# Patient Record
Sex: Female | Born: 1935 | Race: White | Hispanic: No | Smoking: Former smoker
Health system: Southern US, Community
[De-identification: ages and names within clinical notes are randomized; demographics above are authoritative.]

## PROBLEM LIST (undated history)

## (undated) DIAGNOSIS — C4490 Unspecified malignant neoplasm of skin, unspecified: Secondary | ICD-10-CM

## (undated) DIAGNOSIS — I350 Nonrheumatic aortic (valve) stenosis: Secondary | ICD-10-CM

## (undated) DIAGNOSIS — E785 Hyperlipidemia, unspecified: Secondary | ICD-10-CM

## (undated) DIAGNOSIS — I639 Cerebral infarction, unspecified: Secondary | ICD-10-CM

## (undated) DIAGNOSIS — I10 Essential (primary) hypertension: Secondary | ICD-10-CM

## (undated) DIAGNOSIS — Z9889 Other specified postprocedural states: Secondary | ICD-10-CM

## (undated) DIAGNOSIS — R011 Cardiac murmur, unspecified: Secondary | ICD-10-CM

## (undated) DIAGNOSIS — C801 Malignant (primary) neoplasm, unspecified: Secondary | ICD-10-CM

## (undated) DIAGNOSIS — M1990 Unspecified osteoarthritis, unspecified site: Secondary | ICD-10-CM

## (undated) DIAGNOSIS — E7800 Pure hypercholesterolemia, unspecified: Secondary | ICD-10-CM

## (undated) DIAGNOSIS — R112 Nausea with vomiting, unspecified: Secondary | ICD-10-CM

## (undated) DIAGNOSIS — G459 Transient cerebral ischemic attack, unspecified: Secondary | ICD-10-CM

## (undated) DIAGNOSIS — Z8719 Personal history of other diseases of the digestive system: Secondary | ICD-10-CM

## (undated) DIAGNOSIS — M1610 Unilateral primary osteoarthritis, unspecified hip: Secondary | ICD-10-CM

## (undated) DIAGNOSIS — Z862 Personal history of diseases of the blood and blood-forming organs and certain disorders involving the immune mechanism: Secondary | ICD-10-CM

## (undated) HISTORY — DX: Unspecified malignant neoplasm of skin, unspecified: C44.90

## (undated) HISTORY — PX: ABDOMINAL HYSTERECTOMY: SHX81

## (undated) HISTORY — DX: Unilateral primary osteoarthritis, unspecified hip: M16.10

## (undated) HISTORY — PX: CHOLECYSTECTOMY: SHX55

## (undated) HISTORY — DX: Transient cerebral ischemic attack, unspecified: G45.9

## (undated) HISTORY — DX: Nonrheumatic aortic (valve) stenosis: I35.0

## (undated) HISTORY — DX: Essential (primary) hypertension: I10

## (undated) HISTORY — DX: Hyperlipidemia, unspecified: E78.5

## (undated) HISTORY — PX: COLONOSCOPY: SHX174

---
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
## 2011-11-27 DIAGNOSIS — M949 Disorder of cartilage, unspecified: Secondary | ICD-10-CM | POA: Diagnosis not present

## 2011-11-27 DIAGNOSIS — M899 Disorder of bone, unspecified: Secondary | ICD-10-CM | POA: Diagnosis not present

## 2011-12-09 DIAGNOSIS — H40019 Open angle with borderline findings, low risk, unspecified eye: Secondary | ICD-10-CM | POA: Diagnosis not present

## 2011-12-15 DIAGNOSIS — M25519 Pain in unspecified shoulder: Secondary | ICD-10-CM | POA: Diagnosis not present

## 2011-12-17 DIAGNOSIS — Z6828 Body mass index (BMI) 28.0-28.9, adult: Secondary | ICD-10-CM | POA: Diagnosis not present

## 2011-12-23 DIAGNOSIS — M25519 Pain in unspecified shoulder: Secondary | ICD-10-CM | POA: Diagnosis not present

## 2011-12-26 DIAGNOSIS — M25519 Pain in unspecified shoulder: Secondary | ICD-10-CM | POA: Diagnosis not present

## 2011-12-30 DIAGNOSIS — M25519 Pain in unspecified shoulder: Secondary | ICD-10-CM | POA: Diagnosis not present

## 2012-01-01 DIAGNOSIS — J042 Acute laryngotracheitis: Secondary | ICD-10-CM | POA: Diagnosis not present

## 2012-01-01 DIAGNOSIS — M25519 Pain in unspecified shoulder: Secondary | ICD-10-CM | POA: Diagnosis not present

## 2012-01-05 DIAGNOSIS — J0100 Acute maxillary sinusitis, unspecified: Secondary | ICD-10-CM | POA: Diagnosis not present

## 2012-01-14 DIAGNOSIS — M25519 Pain in unspecified shoulder: Secondary | ICD-10-CM | POA: Diagnosis not present

## 2012-02-02 DIAGNOSIS — M25519 Pain in unspecified shoulder: Secondary | ICD-10-CM | POA: Diagnosis not present

## 2012-02-10 DIAGNOSIS — M25519 Pain in unspecified shoulder: Secondary | ICD-10-CM | POA: Diagnosis not present

## 2012-03-04 DIAGNOSIS — I059 Rheumatic mitral valve disease, unspecified: Secondary | ICD-10-CM | POA: Diagnosis not present

## 2012-03-04 DIAGNOSIS — Z79899 Other long term (current) drug therapy: Secondary | ICD-10-CM | POA: Diagnosis not present

## 2012-03-04 DIAGNOSIS — D508 Other iron deficiency anemias: Secondary | ICD-10-CM | POA: Diagnosis not present

## 2012-03-04 DIAGNOSIS — E782 Mixed hyperlipidemia: Secondary | ICD-10-CM | POA: Diagnosis not present

## 2012-03-04 DIAGNOSIS — I359 Nonrheumatic aortic valve disorder, unspecified: Secondary | ICD-10-CM | POA: Diagnosis not present

## 2012-03-08 DIAGNOSIS — I359 Nonrheumatic aortic valve disorder, unspecified: Secondary | ICD-10-CM | POA: Diagnosis not present

## 2012-03-08 DIAGNOSIS — I369 Nonrheumatic tricuspid valve disorder, unspecified: Secondary | ICD-10-CM | POA: Diagnosis not present

## 2012-03-08 DIAGNOSIS — I079 Rheumatic tricuspid valve disease, unspecified: Secondary | ICD-10-CM | POA: Diagnosis not present

## 2012-03-12 DIAGNOSIS — M1710 Unilateral primary osteoarthritis, unspecified knee: Secondary | ICD-10-CM | POA: Diagnosis not present

## 2012-03-12 DIAGNOSIS — M25569 Pain in unspecified knee: Secondary | ICD-10-CM | POA: Diagnosis not present

## 2012-03-25 DIAGNOSIS — K5730 Diverticulosis of large intestine without perforation or abscess without bleeding: Secondary | ICD-10-CM | POA: Diagnosis not present

## 2012-03-25 DIAGNOSIS — K5520 Angiodysplasia of colon without hemorrhage: Secondary | ICD-10-CM | POA: Diagnosis not present

## 2012-03-25 DIAGNOSIS — Z1211 Encounter for screening for malignant neoplasm of colon: Secondary | ICD-10-CM | POA: Diagnosis not present

## 2012-04-21 DIAGNOSIS — J301 Allergic rhinitis due to pollen: Secondary | ICD-10-CM | POA: Diagnosis not present

## 2012-04-21 DIAGNOSIS — M545 Low back pain: Secondary | ICD-10-CM | POA: Diagnosis not present

## 2012-04-21 DIAGNOSIS — M546 Pain in thoracic spine: Secondary | ICD-10-CM | POA: Diagnosis not present

## 2012-04-23 DIAGNOSIS — M545 Low back pain: Secondary | ICD-10-CM | POA: Diagnosis not present

## 2012-04-23 DIAGNOSIS — M25569 Pain in unspecified knee: Secondary | ICD-10-CM | POA: Diagnosis not present

## 2012-04-23 DIAGNOSIS — M1710 Unilateral primary osteoarthritis, unspecified knee: Secondary | ICD-10-CM | POA: Diagnosis not present

## 2012-04-23 DIAGNOSIS — M546 Pain in thoracic spine: Secondary | ICD-10-CM | POA: Diagnosis not present

## 2012-04-26 DIAGNOSIS — M545 Low back pain: Secondary | ICD-10-CM | POA: Diagnosis not present

## 2012-04-26 DIAGNOSIS — M546 Pain in thoracic spine: Secondary | ICD-10-CM | POA: Diagnosis not present

## 2012-05-04 DIAGNOSIS — M546 Pain in thoracic spine: Secondary | ICD-10-CM | POA: Diagnosis not present

## 2012-05-04 DIAGNOSIS — M545 Low back pain: Secondary | ICD-10-CM | POA: Diagnosis not present

## 2012-05-06 DIAGNOSIS — M546 Pain in thoracic spine: Secondary | ICD-10-CM | POA: Diagnosis not present

## 2012-05-06 DIAGNOSIS — M545 Low back pain: Secondary | ICD-10-CM | POA: Diagnosis not present

## 2012-05-11 DIAGNOSIS — M546 Pain in thoracic spine: Secondary | ICD-10-CM | POA: Diagnosis not present

## 2012-05-11 DIAGNOSIS — M545 Low back pain: Secondary | ICD-10-CM | POA: Diagnosis not present

## 2012-06-07 DIAGNOSIS — R0982 Postnasal drip: Secondary | ICD-10-CM | POA: Diagnosis not present

## 2012-06-07 DIAGNOSIS — J301 Allergic rhinitis due to pollen: Secondary | ICD-10-CM | POA: Diagnosis not present

## 2012-06-07 DIAGNOSIS — R05 Cough: Secondary | ICD-10-CM | POA: Diagnosis not present

## 2012-06-28 DIAGNOSIS — M545 Low back pain: Secondary | ICD-10-CM | POA: Diagnosis not present

## 2012-06-28 DIAGNOSIS — M546 Pain in thoracic spine: Secondary | ICD-10-CM | POA: Diagnosis not present

## 2012-07-02 DIAGNOSIS — Z1231 Encounter for screening mammogram for malignant neoplasm of breast: Secondary | ICD-10-CM | POA: Diagnosis not present

## 2012-08-12 DIAGNOSIS — Z23 Encounter for immunization: Secondary | ICD-10-CM | POA: Diagnosis not present

## 2012-08-16 DIAGNOSIS — H40019 Open angle with borderline findings, low risk, unspecified eye: Secondary | ICD-10-CM | POA: Diagnosis not present

## 2012-09-30 DIAGNOSIS — L719 Rosacea, unspecified: Secondary | ICD-10-CM | POA: Diagnosis not present

## 2012-09-30 DIAGNOSIS — L820 Inflamed seborrheic keratosis: Secondary | ICD-10-CM | POA: Diagnosis not present

## 2012-11-22 DIAGNOSIS — M12559 Traumatic arthropathy, unspecified hip: Secondary | ICD-10-CM | POA: Diagnosis not present

## 2012-11-22 DIAGNOSIS — M25569 Pain in unspecified knee: Secondary | ICD-10-CM | POA: Diagnosis not present

## 2012-11-22 DIAGNOSIS — M21169 Varus deformity, not elsewhere classified, unspecified knee: Secondary | ICD-10-CM | POA: Diagnosis not present

## 2012-11-22 DIAGNOSIS — M1710 Unilateral primary osteoarthritis, unspecified knee: Secondary | ICD-10-CM | POA: Diagnosis not present

## 2012-11-22 DIAGNOSIS — R269 Unspecified abnormalities of gait and mobility: Secondary | ICD-10-CM | POA: Diagnosis not present

## 2012-12-08 DIAGNOSIS — M21169 Varus deformity, not elsewhere classified, unspecified knee: Secondary | ICD-10-CM | POA: Diagnosis not present

## 2012-12-08 DIAGNOSIS — R269 Unspecified abnormalities of gait and mobility: Secondary | ICD-10-CM | POA: Diagnosis not present

## 2012-12-08 DIAGNOSIS — M1710 Unilateral primary osteoarthritis, unspecified knee: Secondary | ICD-10-CM | POA: Diagnosis not present

## 2012-12-08 DIAGNOSIS — M25569 Pain in unspecified knee: Secondary | ICD-10-CM | POA: Diagnosis not present

## 2012-12-20 DIAGNOSIS — H40019 Open angle with borderline findings, low risk, unspecified eye: Secondary | ICD-10-CM | POA: Diagnosis not present

## 2013-01-20 DIAGNOSIS — L821 Other seborrheic keratosis: Secondary | ICD-10-CM | POA: Diagnosis not present

## 2013-01-24 DIAGNOSIS — Z23 Encounter for immunization: Secondary | ICD-10-CM | POA: Diagnosis not present

## 2013-01-24 DIAGNOSIS — S41109A Unspecified open wound of unspecified upper arm, initial encounter: Secondary | ICD-10-CM | POA: Diagnosis not present

## 2013-01-24 DIAGNOSIS — B009 Herpesviral infection, unspecified: Secondary | ICD-10-CM | POA: Diagnosis not present

## 2013-03-07 DIAGNOSIS — Z0000 Encounter for general adult medical examination without abnormal findings: Secondary | ICD-10-CM | POA: Diagnosis not present

## 2013-03-07 DIAGNOSIS — I359 Nonrheumatic aortic valve disorder, unspecified: Secondary | ICD-10-CM | POA: Diagnosis not present

## 2013-03-07 DIAGNOSIS — Z79899 Other long term (current) drug therapy: Secondary | ICD-10-CM | POA: Diagnosis not present

## 2013-03-07 DIAGNOSIS — E782 Mixed hyperlipidemia: Secondary | ICD-10-CM | POA: Diagnosis not present

## 2013-03-07 DIAGNOSIS — I10 Essential (primary) hypertension: Secondary | ICD-10-CM | POA: Diagnosis not present

## 2013-03-08 DIAGNOSIS — N390 Urinary tract infection, site not specified: Secondary | ICD-10-CM | POA: Diagnosis not present

## 2013-03-21 DIAGNOSIS — R269 Unspecified abnormalities of gait and mobility: Secondary | ICD-10-CM | POA: Diagnosis not present

## 2013-03-21 DIAGNOSIS — M1710 Unilateral primary osteoarthritis, unspecified knee: Secondary | ICD-10-CM | POA: Diagnosis not present

## 2013-03-21 DIAGNOSIS — M21169 Varus deformity, not elsewhere classified, unspecified knee: Secondary | ICD-10-CM | POA: Diagnosis not present

## 2013-03-21 DIAGNOSIS — M7120 Synovial cyst of popliteal space [Baker], unspecified knee: Secondary | ICD-10-CM | POA: Diagnosis not present

## 2013-05-25 DIAGNOSIS — H40019 Open angle with borderline findings, low risk, unspecified eye: Secondary | ICD-10-CM | POA: Diagnosis not present

## 2013-06-17 DIAGNOSIS — L820 Inflamed seborrheic keratosis: Secondary | ICD-10-CM | POA: Diagnosis not present

## 2013-06-23 DIAGNOSIS — M1710 Unilateral primary osteoarthritis, unspecified knee: Secondary | ICD-10-CM | POA: Diagnosis not present

## 2013-06-23 DIAGNOSIS — M25569 Pain in unspecified knee: Secondary | ICD-10-CM | POA: Diagnosis not present

## 2013-07-06 DIAGNOSIS — M25569 Pain in unspecified knee: Secondary | ICD-10-CM | POA: Diagnosis not present

## 2013-07-06 DIAGNOSIS — M1710 Unilateral primary osteoarthritis, unspecified knee: Secondary | ICD-10-CM | POA: Diagnosis not present

## 2013-07-08 DIAGNOSIS — Z1231 Encounter for screening mammogram for malignant neoplasm of breast: Secondary | ICD-10-CM | POA: Diagnosis not present

## 2013-08-12 DIAGNOSIS — Z23 Encounter for immunization: Secondary | ICD-10-CM | POA: Diagnosis not present

## 2013-09-09 DIAGNOSIS — J069 Acute upper respiratory infection, unspecified: Secondary | ICD-10-CM | POA: Diagnosis not present

## 2013-10-28 DIAGNOSIS — M1710 Unilateral primary osteoarthritis, unspecified knee: Secondary | ICD-10-CM | POA: Diagnosis not present

## 2013-11-04 DIAGNOSIS — R9431 Abnormal electrocardiogram [ECG] [EKG]: Secondary | ICD-10-CM | POA: Diagnosis not present

## 2013-11-04 DIAGNOSIS — I359 Nonrheumatic aortic valve disorder, unspecified: Secondary | ICD-10-CM | POA: Diagnosis not present

## 2013-11-04 DIAGNOSIS — Z01818 Encounter for other preprocedural examination: Secondary | ICD-10-CM | POA: Diagnosis not present

## 2013-11-07 DIAGNOSIS — E7800 Pure hypercholesterolemia, unspecified: Secondary | ICD-10-CM | POA: Diagnosis not present

## 2013-11-07 DIAGNOSIS — I10 Essential (primary) hypertension: Secondary | ICD-10-CM | POA: Diagnosis not present

## 2013-11-07 DIAGNOSIS — Z006 Encounter for examination for normal comparison and control in clinical research program: Secondary | ICD-10-CM | POA: Diagnosis not present

## 2013-11-07 DIAGNOSIS — I359 Nonrheumatic aortic valve disorder, unspecified: Secondary | ICD-10-CM | POA: Diagnosis not present

## 2013-11-09 DIAGNOSIS — R0789 Other chest pain: Secondary | ICD-10-CM | POA: Diagnosis not present

## 2013-11-09 DIAGNOSIS — I515 Myocardial degeneration: Secondary | ICD-10-CM | POA: Diagnosis not present

## 2013-11-09 DIAGNOSIS — R9431 Abnormal electrocardiogram [ECG] [EKG]: Secondary | ICD-10-CM | POA: Diagnosis not present

## 2013-11-09 DIAGNOSIS — I369 Nonrheumatic tricuspid valve disorder, unspecified: Secondary | ICD-10-CM | POA: Diagnosis not present

## 2013-12-01 DIAGNOSIS — M1710 Unilateral primary osteoarthritis, unspecified knee: Secondary | ICD-10-CM | POA: Diagnosis not present

## 2013-12-07 DIAGNOSIS — I10 Essential (primary) hypertension: Secondary | ICD-10-CM | POA: Diagnosis not present

## 2014-01-12 ENCOUNTER — Other Ambulatory Visit: Payer: Self-pay | Admitting: Orthopedic Surgery

## 2014-01-12 NOTE — H&P (Signed)
Sacheen Goughnour  DOB: [birth year] Married / Language: English / Race: White Female  Date of Admission:  01-23-2014  Chief Complaint:  Right Knee Pain  History of Present Illness The patient is a 80 year old female who comes in for a preoperative History and Physical. The patient is scheduled for a right total knee arthroplasty to be performed by Dr. Dione Plover. Aluisio, MD at [facility] on 01-23-2014. The patient is seen for a second opinion. The patient reports right knee (worse than left) symptoms including: pain, swelling and grinding . The patient has the current diagnosis of knee osteoarthritis. Prior to being seen today the patient was previously evaluated by a colleague. Previous work-up for this problem has included knee x-rays and arthroscopy of the right knee in 2001. Past treatment for this problem has included economy hinge brace but she states that made her knee feel worse. Current treatment includes nonsteroidal anti-inflammatory drugs (ibuprofen prn). She has had cortisone injections previously, most recent in August which do help. She does not get 100% relief but is able to get around with much less discomfort. She denies locking and catching in the knee. She has a lot of trouble with stairs as well as getting up and down from a seated position. She has a history of intra-articular injection in the right hip for OA. She does occasionally have pain in the right groin, mostly with rotational movements. The right knee does bother her more than the left knee even though she has been told that the left knee "looks worse on x-ray". She says that she is still able to do the things she needs to do but always had discomfort in the knees. She participates in a water aerobics program which helps her knees but has not been since this summer. She has previously been treated at [facility]. She has had injections in the past by Dr. Dierdre Harness who has since moved to [place].  [name] states that her right knee hurts a lot more than the left. It bothers her with most activities and occasionally hurts at rest. She said she has pain about 90% of the time. She does not have functional limitations except with stairs or uneven ground. She would like to be able to do more but the knee does prevent her from doing so. She has had cortisone injections which have lasted variable amounts of time. The initial cortisone used to last about six months then a recent injection has lasted only three months. She does not have pain at night at this time. She also has had pain in the right hip. She has had intraarticular hip injection. She has not had one in two and a half years. Her knees hurt far more than her hips. She is now ready to get the right knee fixed first. They have been treated conservatively in the past for the above stated problem and despite conservative measures, they continue to have progressive pain and severe functional limitations and dysfunction. They have failed non-operative management including home exercise, medications, and injections. It is felt that they would benefit from undergoing total joint replacement. Risks and benefits of the procedure have been discussed with the patient and they elect to proceed with surgery. There are no active contraindications to surgery such as ongoing infection or rapidly progressive neurological disease.  Allergies Biaxin *MACROLIDES*  Problem List/Past Medical Primary osteoarthritis of both knees (715.16) Heart murmur High blood pressure Hypercholesterolemia Osteoarthritis Measles Mumps  Family History Heart Disease.  First Degree Relatives. father Cerebrovascular Accident. mother   Social History Current work status. retired Careers information officer. 4 Alcohol use. current drinker; drinks wine; only occasionally per week Living situation. live with spouse Previously in rehab. no Pain Contract. no Tobacco use.  Former smoker. former smoker; smoke(d) less than 1/2 pack(s) per day Illicit drug use. no Exercise. Exercises weekly; does other Drug/Alcohol Rehab (Currently). no Marital status. married Number of flights of stairs before winded. 2-3 Advance Directives. Living Will, Healthcare POA    Medication History Losartan Potassium-HCTZ (100-12.5MG  Tablet, Oral) Active. Simvastatin (10MG  Tablet, Oral) Active. Latanoprost (0.005% Solution, Ophthalmic) Active. Vitamin D ( Oral) Specific dose unknown - Active. Biotin ( Oral) Specific dose unknown - Active. CoQ10 ( Oral) Specific dose unknown - Active. Aspirin EC (81MG  Tablet DR, Oral) Active. Advil (200MG  Tablet, Oral) Active. Fish Oil Burp-Less (1000MG  Capsule, Oral) Active.   Past Surgical History Tonsillectomy. Date: 10. Arthroscopy of Knee. right Gallbladder Surgery. Date: 59. open Hysterectomy. Date: 2005. complete (non-cancerous)   Review of Systems General:Not Present- Chills, Fever, Night Sweats, Fatigue, Weight Gain, Weight Loss and Memory Loss. Skin:Not Present- Hives, Itching, Rash, Eczema and Lesions. HEENT:Not Present- Tinnitus, Headache, Double Vision, Visual Loss, Hearing Loss and Dentures. Respiratory:Not Present- Shortness of breath with exertion, Shortness of breath at rest, Allergies, Coughing up blood and Chronic Cough. Cardiovascular:Not Present- Chest Pain, Racing/skipping heartbeats, Difficulty Breathing Lying Down, Murmur, Swelling and Palpitations. Gastrointestinal:Not Present- Bloody Stool, Heartburn, Abdominal Pain, Vomiting, Nausea, Constipation, Diarrhea, Difficulty Swallowing, Jaundice and Loss of appetitie. Female Genitourinary:Not Present- Blood in Urine, Urinary frequency, Weak urinary stream, Discharge, Flank Pain, Incontinence, Painful Urination, Urgency, Urinary Retention and Urinating at Night. Musculoskeletal:Present- Joint Pain and Morning Stiffness. Not Present- Muscle Weakness,  Muscle Pain, Joint Swelling, Back Pain and Spasms. Neurological:Not Present- Tremor, Dizziness, Blackout spells, Paralysis, Difficulty with balance and Weakness. Psychiatric:Not Present- Insomnia.    Vitals Pulse: 52 (Regular) Resp.: 12 (Unlabored) BP: 152/74 (Sitting, Left Arm, Standard)     Physical Exam The physical exam findings are as follows:   General Mental Status - Alert, cooperative and good historian. General Appearance- pleasant. Not in acute distress. Orientation- Oriented X3. Build & Nutrition- Well nourished and Well developed.   Head and Neck Head- normocephalic, atraumatic . Neck Global Assessment- bruit auscultated on the right (faint, likely referred murmur from chest), bruit auscultated on the left (faint, likely referred murmur from chest) and supple.   Eye Vision- Wears corrective lenses. Pupil- Bilateral- Regular and Round. Motion- Bilateral- EOMI.   Chest and Lung Exam Auscultation: Breath sounds:- clear at anterior chest wall and - clear at posterior chest wall. Adventitious sounds:- No Adventitious sounds.   Cardiovascular Auscultation:Rhythm- Regular rate and rhythm. Heart Sounds- S1 WNL and S2 WNL. Murmurs & Other Heart Sounds: Murmur 1:Location- Aortic Area, Pulmonic Area and Sternal Border - Left. Timing- Early systolic. Grade- III/VI. Character- Medium pitched. Radiation- Carotids (likely referred murmur from chest).   Abdomen Palpation/Percussion:Tenderness- Abdomen is non-tender to palpation. Rigidity (guarding)- Abdomen is soft. Auscultation:Auscultation of the abdomen reveals - Bowel sounds normal.   Female Genitourinary Not done, not pertinent to present illness  Musculoskeletal  On exam, very pleasant well developed female alert and oriented in no apparent distress. Evaluation of her left hip shows normal range of motion with no discomfort. Right hip also near normal range of  motion with flexion to 120, rotation in 30, out 40, and abduction 40 without pain on range of motion of the hip. Her right knee shows no effusion. She  has slight varus. Range is about 5-125 with marked crepitus on range of motion. Tender medial greater than lateral with no instability noted. Left knee no effusion. Range is 5-135. Moderate crepitus on range of motion. Some tenderness medial greater than lateral with no instability noted. Pulse, sensation, and motor intact both lower extremities. Gait pattern is slightly antalgic on the right.  RADIOGRAPHS: AP both knees and lateral taken today show bone on bone arthritis in the medial and patellofemoral compartments of both knees with slight varus deformities.   Assessment & Plan Primary osteoarthritis of both knees (715.16) Impression: Right Knee  Note: Plan is for a Right Total Knee Replacement by Dr. Wynelle Link.  Plan is to go home following the hospital stay.  PCP - Dr. Ann Held - Patient has been seen preoperatively and felt to be stable for surgery. At the time of her H&P, an ECHO and stress test have been performed but results were pending.  The patient does not have any contraindications and will receive TXA (tranexamic acid) prior to surgery.  Signed electronically by Joelene Millin, III PA-C

## 2014-01-13 ENCOUNTER — Encounter (HOSPITAL_COMMUNITY): Payer: Self-pay | Admitting: Pharmacy Technician

## 2014-01-19 ENCOUNTER — Inpatient Hospital Stay (HOSPITAL_COMMUNITY): Admission: RE | Admit: 2014-01-19 | Payer: Self-pay | Source: Ambulatory Visit

## 2014-01-20 ENCOUNTER — Encounter (HOSPITAL_COMMUNITY)
Admission: RE | Admit: 2014-01-20 | Discharge: 2014-01-20 | Disposition: A | Discharge status: Home / Self Care | Payer: Medicare Other | Source: Ambulatory Visit | Attending: Orthopedic Surgery | Admitting: Orthopedic Surgery

## 2014-01-20 ENCOUNTER — Ambulatory Visit (HOSPITAL_COMMUNITY)
Admission: RE | Admit: 2014-01-20 | Discharge: 2014-01-20 | Disposition: A | Discharge status: Home / Self Care | Payer: Medicare Other | Source: Ambulatory Visit | Attending: Orthopedic Surgery | Admitting: Orthopedic Surgery

## 2014-01-20 ENCOUNTER — Encounter (HOSPITAL_COMMUNITY): Payer: Self-pay

## 2014-01-20 DIAGNOSIS — Z01812 Encounter for preprocedural laboratory examination: Secondary | ICD-10-CM | POA: Insufficient documentation

## 2014-01-20 DIAGNOSIS — Z01818 Encounter for other preprocedural examination: Secondary | ICD-10-CM | POA: Diagnosis not present

## 2014-01-20 HISTORY — DX: Unspecified osteoarthritis, unspecified site: M19.90

## 2014-01-20 HISTORY — DX: Nausea with vomiting, unspecified: R11.2

## 2014-01-20 HISTORY — DX: Essential (primary) hypertension: I10

## 2014-01-20 HISTORY — DX: Cardiac murmur, unspecified: R01.1

## 2014-01-20 HISTORY — DX: Pure hypercholesterolemia, unspecified: E78.00

## 2014-01-20 HISTORY — DX: Other specified postprocedural states: Z98.890

## 2014-01-20 LAB — CBC
HEMATOCRIT: 41.9 % (ref 36.0–46.0)
Hemoglobin: 13.9 g/dL (ref 12.0–15.0)
MCH: 27 pg (ref 26.0–34.0)
MCHC: 33.2 g/dL (ref 30.0–36.0)
MCV: 81.4 fL (ref 78.0–100.0)
Platelets: 164 10*3/uL (ref 150–400)
RBC: 5.15 MIL/uL — ABNORMAL HIGH (ref 3.87–5.11)
RDW: 13.6 % (ref 11.5–15.5)
WBC: 5.9 10*3/uL (ref 4.0–10.5)

## 2014-01-20 LAB — COMPREHENSIVE METABOLIC PANEL
ALBUMIN: 3.8 g/dL (ref 3.5–5.2)
ALK PHOS: 78 U/L (ref 39–117)
ALT: 19 U/L (ref 0–35)
AST: 21 U/L (ref 0–37)
BUN: 14 mg/dL (ref 6–23)
CALCIUM: 9.7 mg/dL (ref 8.4–10.5)
CO2: 27 mEq/L (ref 19–32)
CREATININE: 0.66 mg/dL (ref 0.50–1.10)
Chloride: 98 mEq/L (ref 96–112)
GFR calc non Af Amer: 80 mL/min — ABNORMAL LOW (ref >90)
GLUCOSE: 91 mg/dL (ref 70–99)
POTASSIUM: 4.3 meq/L (ref 3.7–5.3)
Sodium: 136 mEq/L — ABNORMAL LOW (ref 137–147)
Total Bilirubin: 0.4 mg/dL (ref 0.3–1.2)
Total Protein: 7.2 g/dL (ref 6.0–8.3)

## 2014-01-20 LAB — PROTIME-INR
INR: 1.06 (ref 0.00–1.49)
Prothrombin Time: 13.6 seconds (ref 11.6–15.2)

## 2014-01-20 LAB — URINALYSIS, ROUTINE W REFLEX MICROSCOPIC
Bilirubin Urine: NEGATIVE
Glucose, UA: NEGATIVE mg/dL
Hgb urine dipstick: NEGATIVE
KETONES UR: NEGATIVE mg/dL
NITRITE: NEGATIVE
PROTEIN: NEGATIVE mg/dL
SPECIFIC GRAVITY, URINE: 1.011 (ref 1.005–1.030)
UROBILINOGEN UA: 0.2 mg/dL (ref 0.0–1.0)
pH: 7 (ref 5.0–8.0)

## 2014-01-20 LAB — URINE MICROSCOPIC-ADD ON

## 2014-01-20 LAB — SURGICAL PCR SCREEN
MRSA, PCR: NEGATIVE
Staphylococcus aureus: POSITIVE — AB

## 2014-01-20 LAB — ABO/RH: ABO/RH(D): O NEG

## 2014-01-20 LAB — APTT: APTT: 38 s — AB (ref 24–37)

## 2014-01-20 NOTE — Pre-Procedure Instructions (Signed)
EKG REPORT12/12/14 FROM DR. Saco ECHOCARDIOGRAM REPORT 11/09/13 ON CHART FROM [place]. CXR WAS DONE TODAY - PREOP - AT [facility].

## 2014-01-20 NOTE — Patient Instructions (Signed)
   YOUR SURGERY IS SCHEDULED AT [facility]  ON:  Monday  3/2  REPORT TO  SHORT STAY CENTER AT:  10:45 AM      PHONE # FOR SHORT STAY IS [phone]  DO NOT EAT ANYTHING AFTER MIDNIGHT THE NIGHT BEFORE YOUR SURGERY.  YOU MAY BRUSH YOUR TEETH. NO FOOD, NO CHEWING GUM, NO MINTS, NO CANDIES, NO CHEWING TOBACCO. YOU MAY HAVE CLEAR LIQUIDS TO DRINK FROM MIDNIGHT UNTIL 7:45 AM DAY OF SURGERY  - LIKE WATER, COFFEE ( NO MILK OR MILK PRODUCTS - SUGAR OK ), APPLE, CRANBERRY, GRAPE JUICE.   NOTHING TO DRINK AFTER 7:45 AM DAY OF SURGERY.  PLEASE TAKE THE FOLLOWING MEDICATIONS THE AM OF YOUR SURGERY WITH A FEW SIPS OF WATER:   NO MEDS TO TAKE    DO NOT BRING VALUABLES, MONEY, CREDIT CARDS.  DO NOT WEAR JEWELRY, MAKE-UP, NAIL POLISH AND NO METAL PINS OR CLIPS IN YOUR HAIR. CONTACT LENS, DENTURES / PARTIALS, GLASSES SHOULD NOT BE WORN TO SURGERY AND IN MOST CASES-HEARING AIDS WILL NEED TO BE REMOVED.  BRING YOUR GLASSES CASE, ANY EQUIPMENT NEEDED FOR YOUR CONTACT LENS. FOR PATIENTS ADMITTED TO THE HOSPITAL--CHECK OUT TIME THE DAY OF DISCHARGE IS 11:00 AM.  ALL INPATIENT ROOMS ARE PRIVATE - WITH BATHROOM, TELEPHONE, TELEVISION AND WIFI INTERNET.                                                    PLEASE READ OVER ANY  FACT SHEETS THAT YOU WERE GIVEN: MRSA INFORMATION, NCENTIVE SPIROMETER INFORMATION.  FAILURE TO FOLLOW THESE INSTRUCTIONS MAY RESULT IN THE CANCELLATION OF YOUR SURGERY. PLEASE BE AWARE THAT YOU MAY NEED ADDITIONAL BLOOD DRAWN DAY OF YOUR SURGERY  PATIENT SIGNATURE_________________________________

## 2014-01-23 ENCOUNTER — Encounter (HOSPITAL_COMMUNITY)
Admission: RE | Disposition: A | Discharge status: Home Health | Payer: Self-pay | Source: Ambulatory Visit | Attending: Orthopedic Surgery

## 2014-01-23 ENCOUNTER — Inpatient Hospital Stay (HOSPITAL_COMMUNITY): Payer: Medicare Other | Admitting: Anesthesiology

## 2014-01-23 ENCOUNTER — Encounter (HOSPITAL_COMMUNITY): Payer: Self-pay | Admitting: *Deleted

## 2014-01-23 ENCOUNTER — Inpatient Hospital Stay (HOSPITAL_COMMUNITY)
Admission: RE | Admit: 2014-01-23 | Discharge: 2014-01-25 | DRG: 470 | Disposition: A | Discharge status: Home Health | Payer: Medicare Other | Source: Ambulatory Visit | Attending: Orthopedic Surgery | Admitting: Orthopedic Surgery

## 2014-01-23 ENCOUNTER — Encounter (HOSPITAL_COMMUNITY): Payer: Medicare Other | Admitting: Anesthesiology

## 2014-01-23 DIAGNOSIS — E7800 Pure hypercholesterolemia, unspecified: Secondary | ICD-10-CM | POA: Diagnosis present

## 2014-01-23 DIAGNOSIS — M25569 Pain in unspecified knee: Secondary | ICD-10-CM | POA: Diagnosis not present

## 2014-01-23 DIAGNOSIS — Z6827 Body mass index (BMI) 27.0-27.9, adult: Secondary | ICD-10-CM

## 2014-01-23 DIAGNOSIS — M179 Osteoarthritis of knee, unspecified: Secondary | ICD-10-CM | POA: Diagnosis present

## 2014-01-23 DIAGNOSIS — Z823 Family history of stroke: Secondary | ICD-10-CM

## 2014-01-23 DIAGNOSIS — M25559 Pain in unspecified hip: Secondary | ICD-10-CM | POA: Diagnosis not present

## 2014-01-23 DIAGNOSIS — M1710 Unilateral primary osteoarthritis, unspecified knee: Secondary | ICD-10-CM

## 2014-01-23 DIAGNOSIS — I10 Essential (primary) hypertension: Secondary | ICD-10-CM | POA: Diagnosis not present

## 2014-01-23 DIAGNOSIS — Z87891 Personal history of nicotine dependence: Secondary | ICD-10-CM

## 2014-01-23 DIAGNOSIS — IMO0002 Reserved for concepts with insufficient information to code with codable children: Secondary | ICD-10-CM | POA: Diagnosis not present

## 2014-01-23 DIAGNOSIS — R011 Cardiac murmur, unspecified: Secondary | ICD-10-CM | POA: Diagnosis present

## 2014-01-23 DIAGNOSIS — Z96651 Presence of right artificial knee joint: Secondary | ICD-10-CM

## 2014-01-23 HISTORY — PX: TOTAL KNEE ARTHROPLASTY: SHX125

## 2014-01-23 HISTORY — DX: Osteoarthritis of knee, unspecified: M17.9

## 2014-01-23 LAB — TYPE AND SCREEN
ABO/RH(D): O NEG
ANTIBODY SCREEN: NEGATIVE

## 2014-01-23 SURGERY — ARTHROPLASTY, KNEE, TOTAL
Anesthesia: Spinal | Site: Knee | Laterality: Right

## 2014-01-23 MED ORDER — DEXAMETHASONE SODIUM PHOSPHATE 10 MG/ML IJ SOLN: 10.0000 mg | Freq: Once | INTRAMUSCULAR | Status: DC

## 2014-01-23 MED ORDER — DEXAMETHASONE SODIUM PHOSPHATE 10 MG/ML IJ SOLN
INTRAMUSCULAR | Status: AC
  Filled 2014-01-23: qty 1

## 2014-01-23 MED ORDER — LACTATED RINGERS IV SOLN
INTRAVENOUS | Status: DC | PRN
  Administered 2014-01-23 (×2): via INTRAVENOUS

## 2014-01-23 MED ORDER — PHENYLEPHRINE HCL 10 MG/ML IJ SOLN
10.0000 mg | INTRAVENOUS | Status: DC | PRN
  Administered 2014-01-23: 50 ug/min via INTRAVENOUS

## 2014-01-23 MED ORDER — DOCUSATE SODIUM 100 MG PO CAPS
100.0000 mg | ORAL_CAPSULE | Freq: Two times a day (BID) | ORAL | Status: DC
  Administered 2014-01-23 – 2014-01-25 (×4): 100 mg via ORAL

## 2014-01-23 MED ORDER — METOCLOPRAMIDE HCL 5 MG/ML IJ SOLN: 5.0000 mg | Freq: Three times a day (TID) | INTRAMUSCULAR | Status: DC | PRN

## 2014-01-23 MED ORDER — CEFAZOLIN SODIUM-DEXTROSE 2-3 GM-% IV SOLR
2.0000 g | INTRAVENOUS | Status: AC
  Administered 2014-01-23: 2 g via INTRAVENOUS

## 2014-01-23 MED ORDER — DEXAMETHASONE SODIUM PHOSPHATE 10 MG/ML IJ SOLN
10.0000 mg | Freq: Every day | INTRAMUSCULAR | Status: AC
  Filled 2014-01-23: qty 1

## 2014-01-23 MED ORDER — ACETAMINOPHEN 325 MG PO TABS: 650.0000 mg | ORAL_TABLET | Freq: Four times a day (QID) | ORAL | Status: DC | PRN

## 2014-01-23 MED ORDER — MORPHINE SULFATE 2 MG/ML IJ SOLN: 1.0000 mg | INTRAMUSCULAR | Status: DC | PRN

## 2014-01-23 MED ORDER — ONDANSETRON HCL 4 MG/2ML IJ SOLN
INTRAMUSCULAR | Status: AC
  Filled 2014-01-23: qty 2

## 2014-01-23 MED ORDER — CHLORHEXIDINE GLUCONATE 4 % EX LIQD: 60.0000 mL | Freq: Once | CUTANEOUS | Status: DC

## 2014-01-23 MED ORDER — SIMVASTATIN 10 MG PO TABS
10.0000 mg | ORAL_TABLET | Freq: Every day | ORAL | Status: DC
  Administered 2014-01-23 – 2014-01-24 (×2): 10 mg via ORAL
  Filled 2014-01-23 (×3): qty 1

## 2014-01-23 MED ORDER — SODIUM CHLORIDE 0.9 % IJ SOLN
INTRAMUSCULAR | Status: AC
  Filled 2014-01-23: qty 50

## 2014-01-23 MED ORDER — HYDROCHLOROTHIAZIDE 12.5 MG PO CAPS
12.5000 mg | ORAL_CAPSULE | Freq: Every day | ORAL | Status: DC
  Administered 2014-01-24 – 2014-01-25 (×2): 12.5 mg via ORAL
  Filled 2014-01-23 (×2): qty 1

## 2014-01-23 MED ORDER — ACETAMINOPHEN 10 MG/ML IV SOLN
1000.0000 mg | Freq: Once | INTRAVENOUS | Status: DC
  Filled 2014-01-23: qty 100

## 2014-01-23 MED ORDER — ONDANSETRON HCL 4 MG/2ML IJ SOLN: 4.0000 mg | Freq: Four times a day (QID) | INTRAMUSCULAR | Status: DC | PRN

## 2014-01-23 MED ORDER — KETOROLAC TROMETHAMINE 15 MG/ML IJ SOLN: 7.5000 mg | Freq: Four times a day (QID) | INTRAMUSCULAR | Status: AC | PRN

## 2014-01-23 MED ORDER — BUPIVACAINE HCL 0.25 % IJ SOLN
INTRAMUSCULAR | Status: DC | PRN
  Administered 2014-01-23: 20 mL

## 2014-01-23 MED ORDER — PROPOFOL 10 MG/ML IV BOLUS
INTRAVENOUS | Status: AC
  Filled 2014-01-23: qty 20

## 2014-01-23 MED ORDER — LACTATED RINGERS IV SOLN
INTRAVENOUS | Status: DC
  Administered 2014-01-23: 1000 mL via INTRAVENOUS

## 2014-01-23 MED ORDER — PROMETHAZINE HCL 25 MG/ML IJ SOLN: 6.2500 mg | INTRAMUSCULAR | Status: DC | PRN

## 2014-01-23 MED ORDER — DEXTROSE-NACL 5-0.9 % IV SOLN
INTRAVENOUS | Status: DC
  Administered 2014-01-23 – 2014-01-24 (×3): via INTRAVENOUS

## 2014-01-23 MED ORDER — ONDANSETRON HCL 4 MG/2ML IJ SOLN
INTRAMUSCULAR | Status: DC | PRN
  Administered 2014-01-23: 4 mg via INTRAVENOUS

## 2014-01-23 MED ORDER — PROPOFOL INFUSION 10 MG/ML OPTIME
INTRAVENOUS | Status: DC | PRN
  Administered 2014-01-23: 75 ug/kg/min via INTRAVENOUS

## 2014-01-23 MED ORDER — LATANOPROST 0.005 % OP SOLN
1.0000 [drp] | Freq: Every day | OPHTHALMIC | Status: DC
  Administered 2014-01-23 – 2014-01-24 (×2): 1 [drp] via OPHTHALMIC
  Filled 2014-01-23: qty 2.5

## 2014-01-23 MED ORDER — PHENYLEPHRINE HCL 10 MG/ML IJ SOLN: INTRAMUSCULAR | Status: DC | PRN

## 2014-01-23 MED ORDER — BUPIVACAINE LIPOSOME 1.3 % IJ SUSP
20.0000 mL | Freq: Once | INTRAMUSCULAR | Status: DC
  Filled 2014-01-23: qty 20

## 2014-01-23 MED ORDER — SODIUM CHLORIDE 0.9 % IJ SOLN
INTRAMUSCULAR | Status: DC | PRN
  Administered 2014-01-23: 30 mL

## 2014-01-23 MED ORDER — DEXAMETHASONE 6 MG PO TABS
10.0000 mg | ORAL_TABLET | Freq: Every day | ORAL | Status: AC
  Administered 2014-01-24: 10 mg via ORAL
  Filled 2014-01-23: qty 1

## 2014-01-23 MED ORDER — METHOCARBAMOL 500 MG PO TABS
500.0000 mg | ORAL_TABLET | Freq: Four times a day (QID) | ORAL | Status: DC | PRN
  Administered 2014-01-24 – 2014-01-25 (×2): 500 mg via ORAL
  Filled 2014-01-23 (×2): qty 1

## 2014-01-23 MED ORDER — BUPIVACAINE LIPOSOME 1.3 % IJ SUSP
INTRAMUSCULAR | Status: DC | PRN
  Administered 2014-01-23: 20 mL

## 2014-01-23 MED ORDER — ONDANSETRON HCL 4 MG PO TABS: 4.0000 mg | ORAL_TABLET | Freq: Four times a day (QID) | ORAL | Status: DC | PRN

## 2014-01-23 MED ORDER — BISACODYL 10 MG RE SUPP: 10.0000 mg | Freq: Every day | RECTAL | Status: DC | PRN

## 2014-01-23 MED ORDER — METOCLOPRAMIDE HCL 10 MG PO TABS
5.0000 mg | ORAL_TABLET | Freq: Three times a day (TID) | ORAL | Status: DC | PRN
  Filled 2014-01-23: qty 1

## 2014-01-23 MED ORDER — ACETAMINOPHEN 650 MG RE SUPP: 650.0000 mg | Freq: Four times a day (QID) | RECTAL | Status: DC | PRN

## 2014-01-23 MED ORDER — DIPHENHYDRAMINE HCL 12.5 MG/5ML PO ELIX: 12.5000 mg | ORAL_SOLUTION | ORAL | Status: DC | PRN

## 2014-01-23 MED ORDER — FLEET ENEMA 7-19 GM/118ML RE ENEM: 1.0000 | ENEMA | Freq: Once | RECTAL | Status: AC | PRN

## 2014-01-23 MED ORDER — RIVAROXABAN 10 MG PO TABS
10.0000 mg | ORAL_TABLET | Freq: Every day | ORAL | Status: DC
  Administered 2014-01-24 – 2014-01-25 (×2): 10 mg via ORAL
  Filled 2014-01-23 (×3): qty 1

## 2014-01-23 MED ORDER — TRAMADOL HCL 50 MG PO TABS: 50.0000 mg | ORAL_TABLET | Freq: Four times a day (QID) | ORAL | Status: DC | PRN

## 2014-01-23 MED ORDER — SODIUM CHLORIDE 0.9 % IR SOLN
Status: DC | PRN
  Administered 2014-01-23: 1000 mL

## 2014-01-23 MED ORDER — HYDROMORPHONE HCL PF 1 MG/ML IJ SOLN: 0.2500 mg | INTRAMUSCULAR | Status: DC | PRN

## 2014-01-23 MED ORDER — OXYCODONE HCL 5 MG PO TABS
5.0000 mg | ORAL_TABLET | ORAL | Status: DC | PRN
  Administered 2014-01-23 (×2): 5 mg via ORAL
  Administered 2014-01-24: 10 mg via ORAL
  Administered 2014-01-24: 5 mg via ORAL
  Administered 2014-01-24 – 2014-01-25 (×4): 10 mg via ORAL
  Administered 2014-01-25 (×2): 5 mg via ORAL
  Administered 2014-01-25: 10 mg via ORAL
  Filled 2014-01-23 (×2): qty 1
  Filled 2014-01-23: qty 2
  Filled 2014-01-23 (×2): qty 1
  Filled 2014-01-23: qty 2
  Filled 2014-01-23: qty 1
  Filled 2014-01-23: qty 2
  Filled 2014-01-23: qty 1
  Filled 2014-01-23 (×3): qty 2

## 2014-01-23 MED ORDER — BUPIVACAINE HCL (PF) 0.25 % IJ SOLN
INTRAMUSCULAR | Status: AC
  Filled 2014-01-23: qty 30

## 2014-01-23 MED ORDER — LOSARTAN POTASSIUM 50 MG PO TABS
100.0000 mg | ORAL_TABLET | Freq: Every day | ORAL | Status: DC
  Administered 2014-01-24 – 2014-01-25 (×2): 100 mg via ORAL
  Filled 2014-01-23 (×2): qty 2

## 2014-01-23 MED ORDER — 0.9 % SODIUM CHLORIDE (POUR BTL) OPTIME
TOPICAL | Status: DC | PRN
  Administered 2014-01-23: 1000 mL

## 2014-01-23 MED ORDER — CEFAZOLIN SODIUM-DEXTROSE 2-3 GM-% IV SOLR
INTRAVENOUS | Status: AC
  Filled 2014-01-23: qty 50

## 2014-01-23 MED ORDER — POLYETHYLENE GLYCOL 3350 17 G PO PACK: 17.0000 g | PACK | Freq: Every day | ORAL | Status: DC | PRN

## 2014-01-23 MED ORDER — ACETAMINOPHEN 10 MG/ML IV SOLN
INTRAVENOUS | Status: DC | PRN
  Administered 2014-01-23: 1000 mg via INTRAVENOUS

## 2014-01-23 MED ORDER — MENTHOL 3 MG MT LOZG
1.0000 | LOZENGE | OROMUCOSAL | Status: DC | PRN
  Filled 2014-01-23: qty 9

## 2014-01-23 MED ORDER — ACETAMINOPHEN 500 MG PO TABS
1000.0000 mg | ORAL_TABLET | Freq: Four times a day (QID) | ORAL | Status: AC
  Administered 2014-01-23 – 2014-01-24 (×4): 1000 mg via ORAL
  Filled 2014-01-23 (×5): qty 2

## 2014-01-23 MED ORDER — PHENOL 1.4 % MT LIQD
1.0000 | OROMUCOSAL | Status: DC | PRN
  Filled 2014-01-23: qty 177

## 2014-01-23 MED ORDER — BUPIVACAINE IN DEXTROSE 0.75-8.25 % IT SOLN
INTRATHECAL | Status: DC | PRN
  Administered 2014-01-23: 1.6 mL via INTRATHECAL

## 2014-01-23 MED ORDER — DEXAMETHASONE SODIUM PHOSPHATE 10 MG/ML IJ SOLN
INTRAMUSCULAR | Status: DC | PRN
  Administered 2014-01-23: 10 mg via INTRAVENOUS

## 2014-01-23 MED ORDER — SODIUM CHLORIDE 0.9 % IV SOLN: INTRAVENOUS | Status: DC

## 2014-01-23 MED ORDER — TRANEXAMIC ACID 100 MG/ML IV SOLN
1000.0000 mg | INTRAVENOUS | Status: AC
  Administered 2014-01-23: 1000 mg via INTRAVENOUS
  Filled 2014-01-23: qty 10

## 2014-01-23 MED ORDER — LOSARTAN POTASSIUM-HCTZ 100-12.5 MG PO TABS: 1.0000 | ORAL_TABLET | Freq: Every morning | ORAL | Status: DC

## 2014-01-23 MED ORDER — CEFAZOLIN SODIUM 1-5 GM-% IV SOLN
1.0000 g | Freq: Four times a day (QID) | INTRAVENOUS | Status: AC
  Administered 2014-01-23 – 2014-01-24 (×2): 1 g via INTRAVENOUS
  Filled 2014-01-23 (×2): qty 50

## 2014-01-23 MED ORDER — METHOCARBAMOL 100 MG/ML IJ SOLN
500.0000 mg | Freq: Four times a day (QID) | INTRAVENOUS | Status: DC | PRN
  Administered 2014-01-23: 500 mg via INTRAVENOUS
  Filled 2014-01-23: qty 5

## 2014-01-23 MED ORDER — STERILE WATER FOR IRRIGATION IR SOLN
Status: DC | PRN
  Administered 2014-01-23: 3000 mL

## 2014-01-23 SURGICAL SUPPLY — 54 items
BAG ZIPLOCK 12X15 (MISCELLANEOUS) ×3 IMPLANT
BANDAGE ELASTIC 6 VELCRO ST LF (GAUZE/BANDAGES/DRESSINGS) ×3 IMPLANT
BANDAGE ESMARK 6X9 LF (GAUZE/BANDAGES/DRESSINGS) ×1 IMPLANT
BLADE SAG 18X100X1.27 (BLADE) ×3 IMPLANT
BLADE SAW SGTL 11.0X1.19X90.0M (BLADE) ×3 IMPLANT
BNDG ESMARK 6X9 LF (GAUZE/BANDAGES/DRESSINGS) ×3
BOWL SMART MIX CTS (DISPOSABLE) ×3 IMPLANT
CAPT RP KNEE ×3 IMPLANT
CEMENT HV SMART SET (Cement) ×6 IMPLANT
CLOSURE WOUND 1/2 X4 (GAUZE/BANDAGES/DRESSINGS) ×2
CUFF TOURN SGL QUICK 34 (TOURNIQUET CUFF) ×2
CUFF TRNQT CYL 34X4X40X1 (TOURNIQUET CUFF) ×1 IMPLANT
DECANTER SPIKE VIAL GLASS SM (MISCELLANEOUS) ×3 IMPLANT
DRAPE EXTREMITY T 121X128X90 (DRAPE) ×3 IMPLANT
DRAPE POUCH INSTRU U-SHP 10X18 (DRAPES) ×3 IMPLANT
DRAPE U-SHAPE 47X51 STRL (DRAPES) ×3 IMPLANT
DRSG ADAPTIC 3X8 NADH LF (GAUZE/BANDAGES/DRESSINGS) ×3 IMPLANT
DURAPREP 26ML APPLICATOR (WOUND CARE) ×3 IMPLANT
ELECT REM PT RETURN 9FT ADLT (ELECTROSURGICAL) ×3
ELECTRODE REM PT RTRN 9FT ADLT (ELECTROSURGICAL) ×1 IMPLANT
EVACUATOR 1/8 PVC DRAIN (DRAIN) ×3 IMPLANT
FACESHIELD LNG OPTICON STERILE (SAFETY) ×15 IMPLANT
GLOVE BIO SURGEON STRL SZ7.5 (GLOVE) IMPLANT
GLOVE BIO SURGEON STRL SZ8 (GLOVE) ×3 IMPLANT
GLOVE BIOGEL PI IND STRL 8 (GLOVE) ×2 IMPLANT
GLOVE BIOGEL PI INDICATOR 8 (GLOVE) ×4
GLOVE SURG SS PI 6.5 STRL IVOR (GLOVE) IMPLANT
GOWN STRL REUS W/TWL LRG LVL3 (GOWN DISPOSABLE) ×3 IMPLANT
GOWN STRL REUS W/TWL XL LVL3 (GOWN DISPOSABLE) IMPLANT
HANDPIECE INTERPULSE COAX TIP (DISPOSABLE) ×2
IMMOBILIZER KNEE 20 (SOFTGOODS) ×3 IMPLANT
KIT BASIN OR (CUSTOM PROCEDURE TRAY) ×3 IMPLANT
MANIFOLD NEPTUNE II (INSTRUMENTS) ×3 IMPLANT
NDL SAFETY ECLIPSE 18X1.5 (NEEDLE) ×2 IMPLANT
NEEDLE HYPO 18GX1.5 SHARP (NEEDLE) ×4
NS IRRIG 1000ML POUR BTL (IV SOLUTION) ×3 IMPLANT
PACK TOTAL JOINT (CUSTOM PROCEDURE TRAY) ×3 IMPLANT
PAD ABD 8X10 STRL (GAUZE/BANDAGES/DRESSINGS) ×3 IMPLANT
PADDING CAST COTTON 6X4 STRL (CAST SUPPLIES) ×6 IMPLANT
POSITIONER SURGICAL ARM (MISCELLANEOUS) ×3 IMPLANT
SET HNDPC FAN SPRY TIP SCT (DISPOSABLE) ×1 IMPLANT
SPONGE GAUZE 4X4 12PLY (GAUZE/BANDAGES/DRESSINGS) ×3 IMPLANT
STRIP CLOSURE SKIN 1/2X4 (GAUZE/BANDAGES/DRESSINGS) ×4 IMPLANT
SUCTION FRAZIER 12FR DISP (SUCTIONS) ×3 IMPLANT
SUT MNCRL AB 4-0 PS2 18 (SUTURE) ×3 IMPLANT
SUT VIC AB 2-0 CT1 27 (SUTURE) ×6
SUT VIC AB 2-0 CT1 TAPERPNT 27 (SUTURE) ×3 IMPLANT
SUT VLOC 180 0 24IN GS25 (SUTURE) ×3 IMPLANT
SYR 20CC LL (SYRINGE) ×3 IMPLANT
SYR 50ML LL SCALE MARK (SYRINGE) ×3 IMPLANT
TOWEL OR 17X26 10 PK STRL BLUE (TOWEL DISPOSABLE) ×6 IMPLANT
TRAY FOLEY CATH 14FRSI W/METER (CATHETERS) ×3 IMPLANT
WATER STERILE IRR 1500ML POUR (IV SOLUTION) ×3 IMPLANT
WRAP KNEE MAXI GEL POST OP (GAUZE/BANDAGES/DRESSINGS) ×3 IMPLANT

## 2014-01-23 NOTE — Anesthesia Postprocedure Evaluation (Signed)
  Anesthesia Post-op Note  Patient: Psychologist, prison and probation services  Procedure(s) Performed: Procedure(s) (LRB): RIGHT TOTAL KNEE ARTHROPLASTY (Right)  Patient Location: PACU  Anesthesia Type: Spinal  Level of Consciousness: awake and alert   Airway and Oxygen Therapy: Patient Spontanous Breathing  Post-op Pain: mild  Post-op Assessment: Post-op Vital signs reviewed, Patient's Cardiovascular Status Stable, Respiratory Function Stable, Patent Airway and No signs of Nausea or vomiting  Last Vitals:  Filed Vitals:   01/23/14 1034  BP: 150/54  Pulse: 78  Temp: 36.3 C  Resp: 20    Post-op Vital Signs: stable   Complications: No apparent anesthesia complications

## 2014-01-23 NOTE — Transfer of Care (Signed)
Immediate Anesthesia Transfer of Care Note  Patient: Ashley Ruiz  Procedure(s) Performed: Procedure(s): RIGHT TOTAL KNEE ARTHROPLASTY (Right)  Patient Location: PACU  Anesthesia Type:Spinal  Level of Consciousness: awake, alert  and oriented  Airway & Oxygen Therapy: Patient Spontanous Breathing and Patient connected to face mask oxygen  Post-op Assessment: Report given to PACU RN and Post -op Vital signs reviewed and stable  Post vital signs: Reviewed and stable  Complications: No apparent anesthesia complications

## 2014-01-23 NOTE — Interval H&P Note (Signed)
History and Physical Interval Note:  03/29/9793 80:16 PM  Ashley Ruiz  has presented today for surgery, with the diagnosis of Osteoarthritis of the Right Knee  The various methods of treatment have been discussed with the patient and family. After consideration of risks, benefits and other options for treatment, the patient has consented to  Procedure(s): RIGHT TOTAL KNEE ARTHROPLASTY (Right) as a surgical intervention .  The patient's history has been reviewed, patient examined, no change in status, stable for surgery.  I have reviewed the patient's chart and labs.  Questions were answered to the patient's satisfaction.     [name]

## 2014-01-23 NOTE — Progress Notes (Signed)
Utilization review completed.  

## 2014-01-23 NOTE — Anesthesia Postprocedure Evaluation (Signed)
  Anesthesia Post-op Note  Patient: Psychologist, prison and probation services  Procedure(s) Performed: Procedure(s) (LRB): RIGHT TOTAL KNEE ARTHROPLASTY (Right)  Patient Location: PACU  Anesthesia Type: Spinal  Level of Consciousness: awake and alert   Airway and Oxygen Therapy: Patient Spontanous Breathing  Post-op Pain: mild  Post-op Assessment: Post-op Vital signs reviewed, Patient's Cardiovascular Status Stable, Respiratory Function Stable, Patent Airway and No signs of Nausea or vomiting  Last Vitals:  Filed Vitals:   01/23/14 1555  BP:   Pulse: 44  Temp:   Resp: 16    Post-op Vital Signs: stable   Complications: No apparent anesthesia complications

## 2014-01-23 NOTE — Op Note (Signed)
Pre-operative diagnosis- Osteoarthritis  Right knee(s)  Post-operative diagnosis- Osteoarthritis Right knee(s)  Procedure-  Right  Total Knee Arthroplasty  Surgeon- Dione Plover. Shulamis Wenberg, MD  Assistant- Arlee Muslim, PA-C   Anesthesia-  Spinal EBL-* No blood loss amount entered *  Drains Hemovac  Tourniquet time-  Total Tourniquet Time Documented: Thigh (Right) - 34 minutes Total: Thigh (Right) - 34 minutes    Complications- None  Condition-PACU - hemodynamically stable.   Brief Clinical Note  Ashley Ruiz is a 80 y.o. year old female with end stage OA of her right knee with progressively worsening pain and dysfunction. She has constant pain, with activity and at rest and significant functional deficits with difficulties even with ADLs. She has had extensive non-op management including analgesics, injections of cortisone and viscosupplements, and home exercise program, but remains in significant pain with significant dysfunction.Radiographs show bone on bone arthritis medial and patellofemoral. She presents now for right Total Knee Arthroplasty.    Procedure in detail---   The patient is brought into the operating room and positioned supine on the operating table. After successful administration of  Spinal,   a tourniquet is placed high on the  Right thigh(s) and the lower extremity is prepped and draped in the usual sterile fashion. Time out is performed by the operating team and then the  Right lower extremity is wrapped in Esmarch, knee flexed and the tourniquet inflated to 300 mmHg.       A midline incision is made with a ten blade through the subcutaneous tissue to the level of the extensor mechanism. A fresh blade is used to make a medial parapatellar arthrotomy. Soft tissue over the proximal medial tibia is subperiosteally elevated to the joint line with a knife and into the semimembranosus bursa with a Cobb elevator. Soft tissue over the proximal lateral tibia is elevated with  attention being paid to avoiding the patellar tendon on the tibial tubercle. The patella is everted, knee flexed 90 degrees and the ACL and PCL are removed. Findings are bone on bone medial and patellofemoral with large global osteophytes.        The drill is used to create a starting hole in the distal femur and the canal is thoroughly irrigated with sterile saline to remove the fatty contents. The 5 degree Right  valgus alignment guide is placed into the femoral canal and the distal femoral cutting block is pinned to remove 10 mm off the distal femur. Resection is made with an oscillating saw.      The tibia is subluxed forward and the menisci are removed. The extramedullary alignment guide is placed referencing proximally at the medial aspect of the tibial tubercle and distally along the second metatarsal axis and tibial crest. The block is pinned to remove 47mm off the more deficient medial  side. Resection is made with an oscillating saw. Size 3is the most appropriate size for the tibia and the proximal tibia is prepared with the modular drill and keel punch for that size.      The femoral sizing guide is placed and size 3 is most appropriate. Rotation is marked off the epicondylar axis and confirmed by creating a rectangular flexion gap at 90 degrees. The size 3 cutting block is pinned in this rotation and the anterior, posterior and chamfer cuts are made with the oscillating saw. The intercondylar block is then placed and that cut is made.      Trial size 3 tibial component, trial size 3 posterior stabilized  femur and a 10  mm posterior stabilized rotating platform insert trial is placed. Full extension is achieved with excellent varus/valgus and anterior/posterior balance throughout full range of motion. The patella is everted and thickness measured to be 22  mm. Free hand resection is taken to 12 mm, a 35 template is placed, lug holes are drilled, trial patella is placed, and it tracks normally.  Osteophytes are removed off the posterior femur with the trial in place. All trials are removed and the cut bone surfaces prepared with pulsatile lavage. Cement is mixed and once ready for implantation, the size 3 tibial implant, size  3 posterior stabilized femoral component, and the size 35 patella are cemented in place and the patella is held with the clamp. The trial insert is placed and the knee held in full extension. The Exparel (20 ml mixed with 30 ml saline) and .25% Bupivicaine, are injected into the extensor mechanism, posterior capsule, medial and lateral gutters and subcutaneous tissues.  All extruded cement is removed and once the cement is hard the permanent 10 mm posterior stabilized rotating platform insert is placed into the tibial tray.      The wound is copiously irrigated with saline solution and the extensor mechanism closed over a hemovac drain with #1 PDS suture. The tourniquet is released for a total tourniquet time of 33  minutes. Flexion against gravity is 140 degrees and the patella tracks normally. Subcutaneous tissue is closed with 2.0 vicryl and subcuticular with running 4.0 Monocryl. The incision is cleaned and dried and steri-strips and a bulky sterile dressing are applied. The limb is placed into a knee immobilizer and the patient is awakened and transported to recovery in stable condition.      Please note that a surgical assistant was a medical necessity for this procedure in order to perform it in a safe and expeditious manner. Surgical assistant was necessary to retract the ligaments and vital neurovascular structures to prevent injury to them and also necessary for proper positioning of the limb to allow for anatomic placement of the prosthesis.   Dione Plover Wenceslaus Gist, MD    01/23/2014, 3:06 PM

## 2014-01-23 NOTE — H&P (View-Only) (Signed)
Ashley Ruiz  DOB: [birth year] Married / Language: English / Race: White Female  Date of Admission:  01-23-2014  Chief Complaint:  Right Knee Pain  History of Present Illness The patient is a 80 year old female who comes in for a preoperative History and Physical. The patient is scheduled for a right total knee arthroplasty to be performed by Dr. Dione Plover. Aluisio, MD at [facility] on 01-23-2014. The patient is seen for a second opinion. The patient reports right knee (worse than left) symptoms including: pain, swelling and grinding . The patient has the current diagnosis of knee osteoarthritis. Prior to being seen today the patient was previously evaluated by a colleague. Previous work-up for this problem has included knee x-rays and arthroscopy of the right knee in 2001. Past treatment for this problem has included economy hinge brace but she states that made her knee feel worse. Current treatment includes nonsteroidal anti-inflammatory drugs (ibuprofen prn). She has had cortisone injections previously, most recent in August which do help. She does not get 100% relief but is able to get around with much less discomfort. She denies locking and catching in the knee. She has a lot of trouble with stairs as well as getting up and down from a seated position. She has a history of intra-articular injection in the right hip for OA. She does occasionally have pain in the right groin, mostly with rotational movements. The right knee does bother her more than the left knee even though she has been told that the left knee "looks worse on x-ray". She says that she is still able to do the things she needs to do but always had discomfort in the knees. She participates in a water aerobics program which helps her knees but has not been since this summer. She has previously been treated at [facility]. She has had injections in the past by Dr. Dierdre Harness who has since moved to [place].  [name] states that her right knee hurts a lot more than the left. It bothers her with most activities and occasionally hurts at rest. She said she has pain about 90% of the time. She does not have functional limitations except with stairs or uneven ground. She would like to be able to do more but the knee does prevent her from doing so. She has had cortisone injections which have lasted variable amounts of time. The initial cortisone used to last about six months then a recent injection has lasted only three months. She does not have pain at night at this time. She also has had pain in the right hip. She has had intraarticular hip injection. She has not had one in two and a half years. Her knees hurt far more than her hips. She is now ready to get the right knee fixed first. They have been treated conservatively in the past for the above stated problem and despite conservative measures, they continue to have progressive pain and severe functional limitations and dysfunction. They have failed non-operative management including home exercise, medications, and injections. It is felt that they would benefit from undergoing total joint replacement. Risks and benefits of the procedure have been discussed with the patient and they elect to proceed with surgery. There are no active contraindications to surgery such as ongoing infection or rapidly progressive neurological disease.  Allergies Biaxin *MACROLIDES*  Problem List/Past Medical Primary osteoarthritis of both knees (715.16) Heart murmur High blood pressure Hypercholesterolemia Osteoarthritis Measles Mumps  Family History Heart Disease.  First Degree Relatives. father Cerebrovascular Accident. mother   Social History Current work status. retired Careers information officer. 4 Alcohol use. current drinker; drinks wine; only occasionally per week Living situation. live with spouse Previously in rehab. no Pain Contract. no Tobacco use.  Former smoker. former smoker; smoke(d) less than 1/2 pack(s) per day Illicit drug use. no Exercise. Exercises weekly; does other Drug/Alcohol Rehab (Currently). no Marital status. married Number of flights of stairs before winded. 2-3 Advance Directives. Living Will, Healthcare POA    Medication History Losartan Potassium-HCTZ (100-12.5MG  Tablet, Oral) Active. Simvastatin (10MG  Tablet, Oral) Active. Latanoprost (0.005% Solution, Ophthalmic) Active. Vitamin D ( Oral) Specific dose unknown - Active. Biotin ( Oral) Specific dose unknown - Active. CoQ10 ( Oral) Specific dose unknown - Active. Aspirin EC (81MG  Tablet DR, Oral) Active. Advil (200MG  Tablet, Oral) Active. Fish Oil Burp-Less (1000MG  Capsule, Oral) Active.   Past Surgical History Tonsillectomy. Date: 49. Arthroscopy of Knee. right Gallbladder Surgery. Date: 40. open Hysterectomy. Date: 2005. complete (non-cancerous)   Review of Systems General:Not Present- Chills, Fever, Night Sweats, Fatigue, Weight Gain, Weight Loss and Memory Loss. Skin:Not Present- Hives, Itching, Rash, Eczema and Lesions. HEENT:Not Present- Tinnitus, Headache, Double Vision, Visual Loss, Hearing Loss and Dentures. Respiratory:Not Present- Shortness of breath with exertion, Shortness of breath at rest, Allergies, Coughing up blood and Chronic Cough. Cardiovascular:Not Present- Chest Pain, Racing/skipping heartbeats, Difficulty Breathing Lying Down, Murmur, Swelling and Palpitations. Gastrointestinal:Not Present- Bloody Stool, Heartburn, Abdominal Pain, Vomiting, Nausea, Constipation, Diarrhea, Difficulty Swallowing, Jaundice and Loss of appetitie. Female Genitourinary:Not Present- Blood in Urine, Urinary frequency, Weak urinary stream, Discharge, Flank Pain, Incontinence, Painful Urination, Urgency, Urinary Retention and Urinating at Night. Musculoskeletal:Present- Joint Pain and Morning Stiffness. Not Present- Muscle Weakness,  Muscle Pain, Joint Swelling, Back Pain and Spasms. Neurological:Not Present- Tremor, Dizziness, Blackout spells, Paralysis, Difficulty with balance and Weakness. Psychiatric:Not Present- Insomnia.    Vitals Pulse: 52 (Regular) Resp.: 12 (Unlabored) BP: 152/74 (Sitting, Left Arm, Standard)     Physical Exam The physical exam findings are as follows:   General Mental Status - Alert, cooperative and good historian. General Appearance- pleasant. Not in acute distress. Orientation- Oriented X3. Build & Nutrition- Well nourished and Well developed.   Head and Neck Head- normocephalic, atraumatic . Neck Global Assessment- bruit auscultated on the right (faint, likely referred murmur from chest), bruit auscultated on the left (faint, likely referred murmur from chest) and supple.   Eye Vision- Wears corrective lenses. Pupil- Bilateral- Regular and Round. Motion- Bilateral- EOMI.   Chest and Lung Exam Auscultation: Breath sounds:- clear at anterior chest wall and - clear at posterior chest wall. Adventitious sounds:- No Adventitious sounds.   Cardiovascular Auscultation:Rhythm- Regular rate and rhythm. Heart Sounds- S1 WNL and S2 WNL. Murmurs & Other Heart Sounds: Murmur 1:Location- Aortic Area, Pulmonic Area and Sternal Border - Left. Timing- Early systolic. Grade- III/VI. Character- Medium pitched. Radiation- Carotids (likely referred murmur from chest).   Abdomen Palpation/Percussion:Tenderness- Abdomen is non-tender to palpation. Rigidity (guarding)- Abdomen is soft. Auscultation:Auscultation of the abdomen reveals - Bowel sounds normal.   Female Genitourinary Not done, not pertinent to present illness  Musculoskeletal  On exam, very pleasant well developed female alert and oriented in no apparent distress. Evaluation of her left hip shows normal range of motion with no discomfort. Right hip also near normal range of  motion with flexion to 120, rotation in 30, out 40, and abduction 40 without pain on range of motion of the hip. Her right knee shows no effusion. She  has slight varus. Range is about 5-125 with marked crepitus on range of motion. Tender medial greater than lateral with no instability noted. Left knee no effusion. Range is 5-135. Moderate crepitus on range of motion. Some tenderness medial greater than lateral with no instability noted. Pulse, sensation, and motor intact both lower extremities. Gait pattern is slightly antalgic on the right.  RADIOGRAPHS: AP both knees and lateral taken today show bone on bone arthritis in the medial and patellofemoral compartments of both knees with slight varus deformities.   Assessment & Plan Primary osteoarthritis of both knees (715.16) Impression: Right Knee  Note: Plan is for a Right Total Knee Replacement by Dr. Wynelle Link.  Plan is to go home following the hospital stay.  PCP - Dr. Ann Held - Patient has been seen preoperatively and felt to be stable for surgery. At the time of her H&P, an ECHO and stress test have been performed but results were pending.  The patient does not have any contraindications and will receive TXA (tranexamic acid) prior to surgery.  Signed electronically by Joelene Millin, III PA-C

## 2014-01-23 NOTE — Progress Notes (Signed)
pacu nursing -  Dentures returned to patient per her request

## 2014-01-23 NOTE — Anesthesia Preprocedure Evaluation (Signed)
Anesthesia Evaluation  Patient identified by MRN, date of birth, ID band Patient awake    Reviewed: Allergy & Precautions, H&P , NPO status , Patient's Chart, lab work & pertinent test results  Airway Mallampati: II TM Distance: >3 FB Neck ROM: Full    Dental no notable dental hx.    Pulmonary neg pulmonary ROS, former smoker,  breath sounds clear to auscultation  Pulmonary exam normal       Cardiovascular hypertension, Pt. on medications Rhythm:Regular Rate:Normal     Neuro/Psych negative neurological ROS  negative psych ROS   GI/Hepatic negative GI ROS, Neg liver ROS,   Endo/Other  negative endocrine ROS  Renal/GU negative Renal ROS  negative genitourinary   Musculoskeletal negative musculoskeletal ROS (+)   Abdominal   Peds negative pediatric ROS (+)  Hematology negative hematology ROS (+)   Anesthesia Other Findings   Reproductive/Obstetrics negative OB ROS                           Anesthesia Physical Anesthesia Plan  ASA: II  Anesthesia Plan: Spinal   Post-op Pain Management:    Induction: Intravenous  Airway Management Planned: Simple Face Mask  Additional Equipment:   Intra-op Plan:   Post-operative Plan:   Informed Consent: I have reviewed the patients History and Physical, chart, labs and discussed the procedure including the risks, benefits and alternatives for the proposed anesthesia with the patient or authorized representative who has indicated his/her understanding and acceptance.   Dental advisory given  Plan Discussed with: CRNA and Surgeon  Anesthesia Plan Comments:         Anesthesia Quick Evaluation  

## 2014-01-23 NOTE — Anesthesia Procedure Notes (Signed)
Spinal  Patient location during procedure: OR Start time: 01/23/2014 1:53 PM End time: 01/23/2014 1:57 PM Staffing CRNA/Resident: Anne Fu Performed by: resident/CRNA  Preanesthetic Checklist Completed: patient identified, site marked, surgical consent, pre-op evaluation, timeout performed, IV checked, risks and benefits discussed and monitors and equipment checked Spinal Block Patient position: sitting Prep: Betadine Patient monitoring: heart rate, continuous pulse ox and blood pressure Approach: right paramedian Location: L2-3 Injection technique: single-shot Needle Needle type: Spinocan  Needle gauge: 22 G Needle length: 9 cm Assessment Sensory level: T4 Additional Notes Expiration date of kit checked and confirmed. Patient tolerated procedure well, without complications.  X 1 attempt noted clear CSF return easy aspiration and administration. Noted T4 level on exam, patient tolerated procedure well.

## 2014-01-24 LAB — BASIC METABOLIC PANEL
BUN: 9 mg/dL (ref 6–23)
CHLORIDE: 103 meq/L (ref 96–112)
CO2: 22 mEq/L (ref 19–32)
Calcium: 8.6 mg/dL (ref 8.4–10.5)
Creatinine, Ser: 0.53 mg/dL (ref 0.50–1.10)
GFR, EST NON AFRICAN AMERICAN: 86 mL/min — AB (ref >90)
Glucose, Bld: 146 mg/dL — ABNORMAL HIGH (ref 70–99)
POTASSIUM: 3.8 meq/L (ref 3.7–5.3)
SODIUM: 139 meq/L (ref 137–147)

## 2014-01-24 LAB — CBC
HCT: 36.4 % (ref 36.0–46.0)
Hemoglobin: 12.6 g/dL (ref 12.0–15.0)
MCH: 27.9 pg (ref 26.0–34.0)
MCHC: 34.6 g/dL (ref 30.0–36.0)
MCV: 80.5 fL (ref 78.0–100.0)
Platelets: 150 10*3/uL (ref 150–400)
RBC: 4.52 MIL/uL (ref 3.87–5.11)
RDW: 13.6 % (ref 11.5–15.5)
WBC: 11.1 10*3/uL — AB (ref 4.0–10.5)

## 2014-01-24 NOTE — Evaluation (Signed)
Occupational Therapy Evaluation Patient Details Name: Ashley Ruiz MRN: [id number] DOB: [birth year] Today's Date: 01/24/2014 Time: 1030-1055 OT Time Calculation (min): 25 min  OT Assessment / Plan / Recommendation History of present illness Pt is s/p R TKA   Clinical Impression   Pt is very motivated and progressing nicely with ADL. She will benefit from skilled OT services while on acute to increase independence and safety with ADL tasks.    OT Assessment  Patient needs continued OT Services    Follow Up Recommendations  No OT follow up;Supervision/Assistance - 24 hour    Barriers to Discharge      Equipment Recommendations  3 in 1 bedside comode    Recommendations for Other Services    Frequency  Min 2X/week    Precautions / Restrictions Precautions Precautions: Knee Required Braces or Orthoses: Knee Immobilizer - Right Knee Immobilizer - Right: Discontinue once straight leg raise with < 10 degree lag Restrictions Weight Bearing Restrictions: No Other Position/Activity Restrictions: WBAT   Pertinent Vitals/Pain 3/10 R knee with activity; reposition, ice    ADL  Eating/Feeding: Simulated;Independent Where Assessed - Eating/Feeding: Chair Grooming: Performed;Wash/dry hands;Min guard Where Assessed - Grooming: Unsupported standing Upper Body Bathing: Simulated;Chest;Right arm;Left arm;Abdomen;Set up Where Assessed - Upper Body Bathing: Unsupported sitting Lower Body Bathing: Simulated;Minimal assistance Where Assessed - Lower Body Bathing: Supported sit to stand Upper Body Dressing: Simulated;Set up Where Assessed - Upper Body Dressing: Unsupported sitting Lower Body Dressing: Simulated;Minimal assistance Where Assessed - Lower Body Dressing: Supported sit to stand Toilet Transfer: Performed;Minimal Manufacturing systems engineer: Raised toilet seat with arms (or 3-in-1 over toilet) Toileting - Clothing Manipulation and Hygiene: Simulated;Minimal  assistance Where Assessed - Best boy and Hygiene: Sit to stand from 3-in-1 or toilet Equipment Used: Rolling walker;Knee Immobilizer;Long-handled shoe horn;Long-handled sponge;Reacher;Sock aid ADL Comments: Pt has AE from a family member that loaned it to her but pt able to reach to R foot and don R sock. Encouraged her to keep working on bending knee to don clothes and only use AE if needed. Husband present for education. Reviewed 3in1 and safety with use. Min verbal cues to go slower especially with turns. Min cues to turn walker and face the sink to wash hands.    OT Diagnosis: Generalized weakness  OT Problem List: Decreased strength;Decreased knowledge of use of DME or AE OT Treatment Interventions: Self-care/ADL training;DME and/or AE instruction;Therapeutic activities;Patient/family education   OT Goals(Current goals can be found in the care plan section) Acute Rehab OT Goals Patient Stated Goal: home and be active again OT Goal Formulation: With patient/family Time For Goal Achievement: 02/07/14 Potential to Achieve Goals: Good  Visit Information  Last OT Received On: 01/24/14 Assistance Needed: +1 History of Present Illness: Pt is s/p R TKA       Prior Functioning     Home Living Family/patient expects to be discharged to:: Private residence Living Arrangements: Spouse/significant other Available Help at Discharge: Family Type of Home: [place]: One [place]: Environmental consultant - 2 wheels Prior Function Level of Independence: Independent Communication Communication: No difficulties         Vision/Perception     Cognition  Cognition Arousal/Alertness: Awake/alert Behavior During Therapy: WFL for tasks assessed/performed Overall Cognitive Status: Within Functional Limits for tasks assessed    Extremity/Trunk Assessment Upper Extremity Assessment Upper Extremity Assessment: Overall WFL for tasks assessed     Mobility  Transfers Overall transfer level: Needs assistance Equipment used: Rolling walker (2 wheeled) Transfers: Sit  to/from Stand Sit to Stand: Min assist General transfer comment: verbal cues for safety and hand placement.     Exercise     Balance     End of Session OT - End of Session Equipment Utilized During Treatment: Rolling walker;Right knee immobilizer Activity Tolerance: Patient tolerated treatment well Patient left: in chair;with call bell/phone within reach;with family/visitor present CPM Right Knee CPM Right Knee: Off  [place], [place] [phone] 01/24/2014, 11:14 AM

## 2014-01-24 NOTE — Progress Notes (Addendum)
   CARE MANAGEMENT NOTE 01/24/2014  Patient:  [facility]   Account Number:  [id number]  Date Initiated:  01/24/2014  Documentation initiated by:  [facility]  Subjective/Objective Assessment:   RIGHT TOTAL KNEE ARTHROPLASTY     Action/Plan:   [place]   Anticipated DC Date:  01/26/2014   Anticipated DC Plan:  [place]  CM consult      [facility] Choice  HOME HEALTH   Choice offered to / List presented to:  C-1 Patient   DME arranged  3-N-1      DME agency  [place] arranged  HH-2 PT      [place]   Status of service:  Completed, signed off Medicare Important Message given?   (If response is "NO", the following Medicare IM given date fields will be blank) Date Medicare IM given:   Date Additional Medicare IM given:    Discharge Disposition:  [place]  Per UR Regulation:    If discussed at Long Length of Stay Meetings, dates discussed:    Comments:  01/24/2014 11:18 AM  NCM spoke to pt and offered choice for [facility]. Pt agreeable to [facility] for [facility]. States she has RW, at home. [facility] notified. Requested 3n1 for home. Notified [facility].  Jonnie Finner RN CCM Case Mgmt phone [phone]

## 2014-01-24 NOTE — Progress Notes (Signed)
Physical Therapy Treatment Patient Details Name: Ashley Ruiz MRN: [id number] DOB: 1929/09/07 Today's Date: 01/24/2014 Time: 3244-0102 PT Time Calculation (min): 17 min  PT Assessment / Plan / Recommendation  History of Present Illness Pt is s/p R TKA   PT Comments   Pt tolerating ambulation well. Plans Dc tomorrow.  Follow Up Recommendations  Home health PT     Does the patient have the potential to tolerate intense rehabilitation     Barriers to Discharge        Equipment Recommendations  None recommended by PT    Recommendations for Other Services    Frequency 7X/week   Progress towards PT Goals Progress towards PT goals: Progressing toward goals  Plan Current plan remains appropriate    Precautions / Restrictions Precautions Precautions: Knee Required Braces or Orthoses: Knee Immobilizer - Right Knee Immobilizer - Right: Discontinue once straight leg raise with < 10 degree lag   Pertinent Vitals/Pain 2 to 6 with avity, ice .    Mobility  Bed Mobility Overal bed mobility: Needs Assistance Bed Mobility: Sit to Supine Supine to sit: Supervision Sit to supine: Min assist General bed mobility comments: support R leg onto bed. Transfers Overall transfer level: Needs assistance Equipment used: Rolling walker (2 wheeled) Transfers: Sit to/from Stand Sit to Stand: Min guard General transfer comment: from recliner, a bit impulsive. cues for safety  Ambulation/Gait Ambulation/Gait assistance: Min guard Ambulation Distance (Feet): 175 Feet Assistive device: Rolling walker (2 wheeled) Gait Pattern/deviations: Step-through pattern General Gait Details: cues for sequence.    Exercises Total Joint Exercises Ankle Circles/Pumps: AROM;Both;10 reps;Supine Quad Sets: AROM;Both;Supine Short Arc Quad: AROM;Both;10 reps;Supine Heel Slides: AAROM;Right;10 reps;Supine Hip ABduction/ADduction: AAROM;Right;10 reps;Supine   PT Diagnosis: Difficulty walking  PT Problem List:  Decreased strength;Decreased activity tolerance;Decreased range of motion;Decreased mobility;Decreased knowledge of precautions;Decreased safety awareness;Decreased knowledge of use of DME PT Treatment Interventions: DME instruction;Gait training;Stair training;Functional mobility training;Therapeutic activities;Therapeutic exercise;Patient/family education   PT Goals (current goals can now be found in the care plan section) Acute Rehab PT Goals Patient Stated Goal: , swim PT Goal Formulation: With patient/family Time For Goal Achievement: 01/27/14 Potential to Achieve Goals: Good  Visit Information  Last PT Received On: 01/24/14 Assistance Needed: +1 History of Present Illness: Pt is s/p R TKA    Subjective Data  Patient Stated Goal: , swim   Cognition  Cognition Arousal/Alertness: Awake/alert Behavior During Therapy: WFL for tasks assessed/performed    Balance     End of Session PT - End of Session Equipment Utilized During Treatment: Gait belt Activity Tolerance: Patient tolerated treatment well Patient left: in bed;with call bell/phone within reach;with family/visitor present Nurse Communication: Mobility status CPM Right Knee CPM Right Knee: Off   GP     Claretha Cooper 01/24/2014, 3:26 PM

## 2014-01-24 NOTE — Discharge Instructions (Addendum)
° °Dr. Frank Aluisio °Total Joint Specialist °[facility] °[address] °[place], [place] [address] °[phone] ° °TOTAL KNEE REPLACEMENT POSTOPERATIVE DIRECTIONS ° ° ° °Knee Rehabilitation, Guidelines Following Surgery  °Results after knee surgery are often greatly improved when you follow the exercise, range of motion and muscle strengthening exercises prescribed by your doctor. Safety measures are also important to protect the knee from further injury. Any time any of these exercises cause you to have increased pain or swelling in your knee joint, decrease the amount until you are comfortable again and slowly increase them. If you have problems or questions, call your caregiver or physical therapist for advice.  ° °HOME CARE INSTRUCTIONS  °Remove items at home which could result in a fall. This includes throw rugs or furniture in walking pathways.  °Continue medications as instructed at time of discharge. °You may have some home medications which will be placed on hold until you complete the course of blood thinner medication.  °You may start showering once you are discharged home but do not submerge the incision under water. Just pat the incision dry and apply a dry gauze dressing on daily. °Walk with walker as instructed.  °You may resume a sexual relationship in one month or when given the OK by  your doctor.  °· Use walker as long as suggested by your caregivers. °· Avoid periods of inactivity such as sitting longer than an hour when not asleep. This helps prevent blood clots.  °You may put full weight on your legs and walk as much as is comfortable.  °You may return to work once you are cleared by your doctor.  °Do not drive a car for 6 weeks or until released by you surgeon.  °· Do not drive while taking narcotics.  °Wear the elastic stockings for three weeks following surgery during the day but you may remove then at night. °Make sure you keep all of your appointments after your  operation with all of your doctors and caregivers. You should call the office at the above phone number and make an appointment for approximately two weeks after the date of your surgery. °Change the dressing daily and reapply a dry dressing each time. °Please pick up a stool softener and laxative for home use as long as you are requiring pain medications. °· Continue to use ice on the knee for pain and swelling from surgery. You may notice swelling that will progress down to the foot and ankle.  This is normal after surgery.  Elevate the leg when you are not up walking on it.   °It is important for you to complete the blood thinner medication as prescribed by your doctor. °· Continue to use the breathing machine which will help keep your temperature down.  It is common for your temperature to cycle up and down following surgery, especially at night when you are not up moving around and exerting yourself.  The breathing machine keeps your lungs expanded and your temperature down. ° °RANGE OF MOTION AND STRENGTHENING EXERCISES  °Rehabilitation of the knee is important following a knee injury or an operation. After just a few days of immobilization, the muscles of the thigh which control the knee become weakened and shrink (atrophy). Knee exercises are designed to build up the tone and strength of the thigh muscles and to improve knee motion. Often times heat used for twenty to thirty minutes before working out will loosen up your tissues and help with improving the   range of motion but do not use heat for the first two weeks following surgery. These exercises can be done on a training (exercise) mat, on the floor, on a table or on a bed. Use what ever works the best and is most comfortable for you Knee exercises include:  °Leg Lifts - While your knee is still immobilized in a splint or cast, you can do straight leg raises. Lift the leg to 60 degrees, hold for 3 sec, and slowly lower the leg. Repeat 10-20 times 2-3  times daily. Perform this exercise against resistance later as your knee gets better.  °Quad and Hamstring Sets - Tighten up the muscle on the front of the thigh (Quad) and hold for 5-10 sec. Repeat this 10-20 times hourly. Hamstring sets are done by pushing the foot backward against an object and holding for 5-10 sec. Repeat as with quad sets.  °A rehabilitation program following serious knee injuries can speed recovery and prevent re-injury in the future due to weakened muscles. Contact your doctor or a physical therapist for more information on knee rehabilitation.  ° °SKILLED REHAB INSTRUCTIONS: °If the patient is transferred to a skilled rehab facility following release from the hospital, a list of the current medications will be sent to the facility for the patient to continue.  When discharged from the skilled rehab facility, please have the facility set up the patient's Home Health Physical Therapy prior to being released. Also, the skilled facility will be responsible for providing the patient with their medications at time of release from the facility to include their pain medication, the muscle relaxants, and their blood thinner medication. If the patient is still at the rehab facility at time of the two week follow up appointment, the skilled rehab facility will also need to assist the patient in arranging follow up appointment in our office and any transportation needs. ° °MAKE SURE YOU:  °Understand these instructions.  °Will watch your condition.  °Will get help right away if you are not doing well or get worse.  ° ° °Pick up stool softner and laxative for home. °Do not submerge incision under water. °May shower. °Continue to use ice for pain and swelling from surgery. ° °======================================================= ° ° °Information on my medicine - XARELTO® (Rivaroxaban) ° °This medication education was reviewed with me or my healthcare representative as part of my discharge preparation.   The pharmacist that spoke with me during my hospital stay was:  Absher, Randall K, RPH ° °Why was Xarelto® prescribed for you? °Xarelto® was prescribed for you to reduce the risk of blood clots forming after orthopedic surgery. The medical term for these abnormal blood clots is venous thromboembolism (VTE). ° °What do you need to know about xarelto® ? °Take your Xarelto® ONCE DAILY at the same time every day. °You may take it either with or without food. ° °If you have difficulty swallowing the tablet whole, you may crush it and mix in applesauce just prior to taking your dose. ° °Take Xarelto® exactly as prescribed by your doctor and DO NOT stop taking Xarelto® without talking to the doctor who prescribed the medication.  Stopping without other VTE prevention medication to take the place of Xarelto® may increase your risk of developing a clot. ° °After discharge, you should have regular check-up appointments with your healthcare provider that is prescribing your Xarelto®.   ° °What do you do if you miss a dose? °If you miss a dose, take it as soon as   you remember on the same day then continue your regularly scheduled once daily regimen the next day. Do not take two doses of Xarelto® on the same day.  ° °Important Safety Information °A possible side effect of Xarelto® is bleeding. You should call your healthcare provider right away if you experience any of the following: °  Bleeding from an injury or your nose that does not stop. °  Unusual colored urine (red or dark brown) or unusual colored stools (red or black). °  Unusual bruising for unknown reasons. °  A serious fall or if you hit your head (even if there is no bleeding). ° °Some medicines may interact with Xarelto® and might increase your risk of bleeding while on Xarelto®. To help avoid this, consult your healthcare provider or pharmacist prior to using any new prescription or non-prescription medications, including herbals, vitamins, non-steroidal  anti-inflammatory drugs (NSAIDs) and supplements. ° °This website has more information on Xarelto®: [email or web address].com. ° ° ° ° ° ° ° ° °

## 2014-01-24 NOTE — Evaluation (Signed)
Physical Therapy Evaluation Patient Details Name: Ashley Ruiz MRN: [id number] DOB: Apr 21, 1929 Today's Date: 01/24/2014 Time: 2979-8921 PT Time Calculation (min): 29 min  PT Assessment / Plan / Recommendation History of Present Illness  Pt is s/p R TKA  Clinical Impression  Pt tolerated very well. Will benefit from PT to address problems listed.    PT Assessment  Patient needs continued PT services    Follow Up Recommendations  Home health PT    Does the patient have the potential to tolerate intense rehabilitation      Barriers to Discharge        Equipment Recommendations  None recommended by PT    Recommendations for Other Services     Frequency 7X/week    Precautions / Restrictions Precautions Precautions: Knee Required Braces or Orthoses: Knee Immobilizer - Right Knee Immobilizer - Right: Discontinue once straight leg raise with < 10 degree lag Restrictions Weight Bearing Restrictions: No Other Position/Activity Restrictions: WBAT   Pertinent Vitals/Pain 3 R knee      Mobility  Bed Mobility Overal bed mobility: Needs Assistance Bed Mobility: Supine to Sit Supine to sit: Supervision Transfers Overall transfer level: Needs assistance Equipment used: Rolling walker (2 wheeled) Transfers: Sit to/from Stand Sit to Stand: Min assist General transfer comment: verbal cues for safety and hand placement. Ambulation/Gait Ambulation/Gait assistance: Min guard Ambulation Distance (Feet): 100 Feet Assistive device: Rolling walker (2 wheeled) Gait Pattern/deviations: Step-through pattern General Gait Details: cues for sequence.    Exercises Total Joint Exercises Ankle Circles/Pumps: AROM;Both;10 reps;Supine Quad Sets: AROM;Both;Supine Short Arc Quad: AROM;Both;10 reps;Supine   PT Diagnosis: Difficulty walking  PT Problem List: Decreased strength;Decreased activity tolerance;Decreased range of motion;Decreased mobility;Decreased knowledge of  precautions;Decreased safety awareness;Decreased knowledge of use of DME PT Treatment Interventions: DME instruction;Gait training;Stair training;Functional mobility training;Therapeutic activities;Therapeutic exercise;Patient/family education     PT Goals(Current goals can be found in the care plan section) Acute Rehab PT Goals Patient Stated Goal: , swim PT Goal Formulation: With patient/family Time For Goal Achievement: 01/27/14 Potential to Achieve Goals: Good  Visit Information  Last PT Received On: 01/24/14 Assistance Needed: +1 History of Present Illness: Pt is s/p R TKA       Prior Functioning  Home Living Family/patient expects to be discharged to:: Private residence Living Arrangements: Spouse/significant other Available Help at Discharge: Family Type of Home: Emigrant: [place]: Environmental consultant - 2 wheels Prior Function Level of Independence: Independent Communication Communication: No difficulties    Cognition  Cognition Arousal/Alertness: Awake/alert Behavior During Therapy: WFL for tasks assessed/performed Overall Cognitive Status: Within Functional Limits for tasks assessed    Extremity/Trunk Assessment Upper Extremity Assessment Upper Extremity Assessment: Defer to OT evaluation Lower Extremity Assessment Lower Extremity Assessment: RLE deficits/detail RLE Deficits / Details: easily performs a SLr   Balance    End of Session PT - End of Session Equipment Utilized During Treatment: Gait belt Activity Tolerance: Patient tolerated treatment well Patient left: in chair;with call bell/phone within reach;with family/visitor present Nurse Communication: Mobility status  GP     Claretha Cooper 01/24/2014, 12:44 PM

## 2014-01-24 NOTE — Progress Notes (Signed)
   Subjective: 1 Day Post-Op Procedure(s) (LRB): RIGHT TOTAL KNEE ARTHROPLASTY (Right) Patient reports pain as mild.   Patient seen in rounds with Dr. Wynelle Link.  She looks great this morning and in very good spirits. Patient is well, and has had no acute complaints or problems We will start therapy today.  Plan is to go Home after hospital stay.  Objective: Vital signs in last 24 hours: Temp:  [97.3 F (36.3 C)-98.4 F (36.9 C)] 97.8 F (36.6 C) (03/03 0615) Pulse Rate:  [41-81] 63 (03/03 0615) Resp:  [12-20] 20 (03/03 0615) BP: (59-150)/(37-76) 147/49 mmHg (03/03 0615) SpO2:  [96 %-100 %] 96 % (03/03 0615) Weight:  [67.132 kg (148 lb)] 67.132 kg (148 lb) (03/02 1640)  Intake/Output from previous day:  Intake/Output Summary (Last 24 hours) at 01/24/14 0712 Last data filed at 01/24/14 0615  Gross per 24 hour  Intake 3216.25 ml  Output   2175 ml  Net 1041.25 ml    Intake/Output this shift:    Labs:  Recent Labs  01/24/14 0512  HGB 12.6    Recent Labs  01/24/14 0512  WBC 11.1*  RBC 4.52  HCT 36.4  PLT 150    Recent Labs  01/24/14 0512  NA 139  K 3.8  CL 103  CO2 22  BUN 9  CREATININE 0.53  GLUCOSE 146*  CALCIUM 8.6   No results found for this basename: LABPT, INR,  in the last 72 hours  EXAM General - Patient is Alert, Appropriate and Oriented Extremity - Neurovascular intact Sensation intact distally Dorsiflexion/Plantar flexion intact Dressing - dressing C/D/I Motor Function - intact, moving foot and toes well on exam.  Hemovac pulled without difficulty.  Past Medical History  Diagnosis Date  . Hypertension   . Heart murmur   . PONV (postoperative nausea and vomiting)   . Arthritis     OA AND PAIN BOTH KNEES; SOME PAIN IN RT HIP  . Hypercholesteremia     Assessment/Plan: 1 Day Post-Op Procedure(s) (LRB): RIGHT TOTAL KNEE ARTHROPLASTY (Right) Principal Problem:   OA (osteoarthritis) of knee  Estimated body mass index is 27.06  kg/(m^2) as calculated from the following:   Height as of this encounter: 5\' 2"  (1.575 m).   Weight as of this encounter: 67.132 kg (148 lb). Advance diet Up with therapy Plan for discharge tomorrow Discharge home with home health  DVT Prophylaxis - Xarelto Weight-Bearing as tolerated to right leg D/C O2 and Pulse OX and try on Room Air  Myca Perno, [place] 01/24/2014, 7:12 AM

## 2014-01-25 LAB — BASIC METABOLIC PANEL
BUN: 11 mg/dL (ref 6–23)
CHLORIDE: 102 meq/L (ref 96–112)
CO2: 27 meq/L (ref 19–32)
Calcium: 8.6 mg/dL (ref 8.4–10.5)
Creatinine, Ser: 0.57 mg/dL (ref 0.50–1.10)
GFR calc Af Amer: >90 mL/min (ref >90)
GFR calc non Af Amer: 84 mL/min — ABNORMAL LOW (ref >90)
GLUCOSE: 115 mg/dL — AB (ref 70–99)
POTASSIUM: 3.8 meq/L (ref 3.7–5.3)
SODIUM: 139 meq/L (ref 137–147)

## 2014-01-25 LAB — CBC
HCT: 33.9 % — ABNORMAL LOW (ref 36.0–46.0)
HEMOGLOBIN: 11.4 g/dL — AB (ref 12.0–15.0)
MCH: 27.5 pg (ref 26.0–34.0)
MCHC: 33.6 g/dL (ref 30.0–36.0)
MCV: 81.7 fL (ref 78.0–100.0)
Platelets: 139 10*3/uL — ABNORMAL LOW (ref 150–400)
RBC: 4.15 MIL/uL (ref 3.87–5.11)
RDW: 14.2 % (ref 11.5–15.5)
WBC: 14.2 10*3/uL — AB (ref 4.0–10.5)

## 2014-01-25 MED ORDER — OXYCODONE HCL 5 MG PO TABS: 5.0000 mg | ORAL_TABLET | ORAL | Status: DC | PRN

## 2014-01-25 MED ORDER — TRAMADOL HCL 50 MG PO TABS: 50.0000 mg | ORAL_TABLET | Freq: Four times a day (QID) | ORAL | Status: DC | PRN

## 2014-01-25 MED ORDER — METHOCARBAMOL 500 MG PO TABS: 500.0000 mg | ORAL_TABLET | Freq: Four times a day (QID) | ORAL | Status: DC | PRN

## 2014-01-25 MED ORDER — RIVAROXABAN 10 MG PO TABS: 10.0000 mg | ORAL_TABLET | Freq: Every day | ORAL | Status: DC

## 2014-01-25 NOTE — Progress Notes (Signed)
Physical Therapy Treatment Patient Details Name: Parker Sawatzky MRN: [id number] DOB: Jun 05, 1929 Today's Date: 01/25/2014 Time: 0822-0852 PT Time Calculation (min): 30 min  PT Assessment / Plan / Recommendation  History of Present Illness Pt is s/p R TKA   PT Comments   Pt tolerated well.for DC  Follow Up Recommendations  Home health PT     Does the patient have the potential to tolerate intense rehabilitation     Barriers to Discharge        Equipment Recommendations  None recommended by PT    Recommendations for Other Services    Frequency 7X/week   Progress towards PT Goals Progress towards PT goals: Progressing toward goals  Plan Current plan remains appropriate    Precautions / Restrictions Precautions Precautions: Fall;Knee Required Braces or Orthoses: Knee Immobilizer - Right Knee Immobilizer - Right: Discontinue once straight leg raise with < 10 degree lag Restrictions Weight Bearing Restrictions: No Other Position/Activity Restrictions: WBAT   Pertinent Vitals/Pain <3    Mobility      Exercises Total Joint Exercises Ankle Circles/Pumps: AROM;Both;10 reps;Supine Quad Sets: AROM;Both;10 reps;Supine Short Arc Quad: AROM;Right;10 reps;Supine Heel Slides: AAROM;Right;10 reps;Supine Hip ABduction/ADduction: AROM;Right;10 reps;Supine Straight Leg Raises: AAROM;Right;10 reps;Supine Goniometric ROM: 5-50   PT Diagnosis:    PT Problem List:   PT Treatment Interventions:     PT Goals (current goals can now be found in the care plan section)    Visit Information  Last PT Received On: 01/25/14 Assistance Needed: +1 History of Present Illness: Pt is s/p R TKA    Subjective Data      Cognition  Cognition Arousal/Alertness: Awake/alert Behavior During Therapy: WFL for tasks assessed/performed Overall Cognitive Status: Within Functional Limits for tasks assessed    Balance     End of Session PT - End of Session Activity Tolerance: Patient tolerated  treatment well Patient left: in bed;with call bell/phone within reach Nurse Communication: Mobility status   GP     Claretha Cooper 01/25/2014, 12:40 PM

## 2014-01-25 NOTE — Plan of Care (Signed)
Problem: Discharge Progression Outcomes Goal: Anticoagulant follow-up in place Outcome: Not Applicable Date Met:  01/25/14 xarelto     

## 2014-01-25 NOTE — Discharge Summary (Signed)
Physician Discharge Summary   Patient ID: Ashley Ruiz MRN: [id number] DOB/AGE: [birth year] 78 y.o.  Admit date: 01/23/2014 Discharge date: 01-25-2014  Primary Diagnosis:  Osteoarthritis Right knee(s)  Admission Diagnoses:  Past Medical History  Diagnosis Date  . Hypertension   . Heart murmur   . PONV (postoperative nausea and vomiting)   . Arthritis     OA AND PAIN BOTH KNEES; SOME PAIN IN RT HIP  . Hypercholesteremia    Discharge Diagnoses:   Principal Problem:   OA (osteoarthritis) of knee  Estimated body mass index is 27.06 kg/(m^2) as calculated from the following:   Height as of this encounter: $RemoveBeforeD'5\' 2"'[id number]$  (1.575 m).   Weight as of this encounter: 67.132 kg (148 lb).  Procedure:  Procedure(s) (LRB): RIGHT TOTAL KNEE ARTHROPLASTY (Right)   Consults: None  HPI: Ashley Ruiz is a 80 y.o. year old female with end stage OA of her right knee with progressively worsening pain and dysfunction. She has constant pain, with activity and at rest and significant functional deficits with difficulties even with ADLs. She has had extensive non-op management including analgesics, injections of cortisone and viscosupplements, and home exercise program, but remains in significant pain with significant dysfunction.Radiographs show bone on bone arthritis medial and patellofemoral. She presents now for right Total Knee Arthroplasty.   Laboratory Data: Admission on 01/23/2014  Component Date Value Ref Range Status  . WBC 01/24/2014 11.1* 4.0 - 10.5 K/uL Final  . RBC 01/24/2014 4.52  3.87 - 5.11 MIL/uL Final  . Hemoglobin 01/24/2014 12.6  12.0 - 15.0 g/dL Final  . HCT 01/24/2014 36.4  36.0 - 46.0 % Final  . MCV 01/24/2014 80.5  78.0 - 100.0 fL Final  . MCH 01/24/2014 27.9  26.0 - 34.0 pg Final  . MCHC 01/24/2014 34.6  30.0 - 36.0 g/dL Final  . RDW 01/24/2014 13.6  11.5 - 15.5 % Final  . Platelets 01/24/2014 150  150 - 400 K/uL Final  . Sodium 01/24/2014 139  137 - 147 mEq/L Final  . Potassium  01/24/2014 3.8  3.7 - 5.3 mEq/L Final  . Chloride 01/24/2014 103  96 - 112 mEq/L Final  . CO2 01/24/2014 22  19 - 32 mEq/L Final  . Glucose, Bld 01/24/2014 146* 70 - 99 mg/dL Final  . BUN 01/24/2014 9  6 - 23 mg/dL Final  . Creatinine, Ser 01/24/2014 0.53  0.50 - 1.10 mg/dL Final  . Calcium 01/24/2014 8.6  8.4 - 10.5 mg/dL Final  . GFR calc non Af Amer 01/24/2014 86* >90 mL/min Final  . GFR calc Af Amer 01/24/2014 >90  >90 mL/min Final   Comment: (NOTE)                          The eGFR has been calculated using the CKD EPI equation.                          This calculation has not been validated in all clinical situations.                          eGFR's persistently <90 mL/min signify possible Chronic Kidney                          Disease.  . WBC 01/25/2014 14.2* 4.0 - 10.5 K/uL Final  . RBC 01/25/2014 4.15  3.87 -  5.11 MIL/uL Final  . Hemoglobin 01/25/2014 11.4* 12.0 - 15.0 g/dL Final  . HCT 95/84/4171 33.9* 36.0 - 46.0 % Final  . MCV 01/25/2014 81.7  78.0 - 100.0 fL Final  . MCH 01/25/2014 27.5  26.0 - 34.0 pg Final  . MCHC 01/25/2014 33.6  30.0 - 36.0 g/dL Final  . RDW 27/87/1836 14.2  11.5 - 15.5 % Final  . Platelets 01/25/2014 139* 150 - 400 K/uL Final  . Sodium 01/25/2014 139  137 - 147 mEq/L Final  . Potassium 01/25/2014 3.8  3.7 - 5.3 mEq/L Final  . Chloride 01/25/2014 102  96 - 112 mEq/L Final  . CO2 01/25/2014 27  19 - 32 mEq/L Final  . Glucose, Bld 01/25/2014 115* 70 - 99 mg/dL Final  . BUN 72/55/0016 11  6 - 23 mg/dL Final  . Creatinine, Ser 01/25/2014 0.57  0.50 - 1.10 mg/dL Final  . Calcium 42/90/3795 8.6  8.4 - 10.5 mg/dL Final  . GFR calc non Af Amer 01/25/2014 84* >90 mL/min Final  . GFR calc Af Amer 01/25/2014 >90  >90 mL/min Final   Comment: (NOTE)                          The eGFR has been calculated using the CKD EPI equation.                          This calculation has not been validated in all clinical situations.                          eGFR's  persistently <90 mL/min signify possible Chronic Kidney                          Disease.  Hospital Outpatient Visit on 01/20/2014  Component Date Value Ref Range Status  . MRSA, PCR 01/20/2014 NEGATIVE  NEGATIVE Final  . Staphylococcus aureus 01/20/2014 POSITIVE* NEGATIVE Final   Comment:                                 The Xpert SA Assay (FDA                          approved for NASAL specimens                          in patients over 53 years of age),                          is one component of                          a comprehensive surveillance                          program.  Test performance has                          been validated by [employer] for patients greater  than or equal to 62 year old.                          It is not intended                          to diagnose infection nor to                          guide or monitor treatment.  Marland Kitchen aPTT 01/20/2014 38* 24 - 37 seconds Final   Comment:                                 IF BASELINE aPTT IS ELEVATED,                          SUGGEST PATIENT RISK ASSESSMENT                          BE USED TO DETERMINE APPROPRIATE                          ANTICOAGULANT THERAPY.  . WBC 01/20/2014 5.9  4.0 - 10.5 K/uL Final  . RBC 01/20/2014 5.15* 3.87 - 5.11 MIL/uL Final  . Hemoglobin 01/20/2014 13.9  12.0 - 15.0 g/dL Final  . HCT 01/20/2014 41.9  36.0 - 46.0 % Final  . MCV 01/20/2014 81.4  78.0 - 100.0 fL Final  . MCH 01/20/2014 27.0  26.0 - 34.0 pg Final  . MCHC 01/20/2014 33.2  30.0 - 36.0 g/dL Final  . RDW 01/20/2014 13.6  11.5 - 15.5 % Final  . Platelets 01/20/2014 164  150 - 400 K/uL Final  . Sodium 01/20/2014 136* 137 - 147 mEq/L Final  . Potassium 01/20/2014 4.3  3.7 - 5.3 mEq/L Final  . Chloride 01/20/2014 98  96 - 112 mEq/L Final  . CO2 01/20/2014 27  19 - 32 mEq/L Final  . Glucose, Bld 01/20/2014 91  70 - 99 mg/dL Final  . BUN 01/20/2014 14  6 - 23 mg/dL Final    . Creatinine, Ser 01/20/2014 0.66  0.50 - 1.10 mg/dL Final  . Calcium 01/20/2014 9.7  8.4 - 10.5 mg/dL Final  . Total Protein 01/20/2014 7.2  6.0 - 8.3 g/dL Final  . Albumin 01/20/2014 3.8  3.5 - 5.2 g/dL Final  . AST 01/20/2014 21  0 - 37 U/L Final  . ALT 01/20/2014 19  0 - 35 U/L Final  . Alkaline Phosphatase 01/20/2014 78  39 - 117 U/L Final  . Total Bilirubin 01/20/2014 0.4  0.3 - 1.2 mg/dL Final  . GFR calc non Af Amer 01/20/2014 80* >90 mL/min Final  . GFR calc Af Amer 01/20/2014 >90  >90 mL/min Final   Comment: (NOTE)                          The eGFR has been calculated using the CKD EPI equation.                          This calculation has not been validated in all clinical situations.  eGFR's persistently <90 mL/min signify possible Chronic Kidney                          Disease.  Marland Kitchen Prothrombin Time 01/20/2014 13.6  11.6 - 15.2 seconds Final  . INR 01/20/2014 1.06  0.00 - 1.49 Final  . ABO/RH(D) 01/20/2014 O NEG   Final  . Antibody Screen 01/20/2014 NEG   Final  . Sample Expiration 01/20/2014 01/26/2014   Final  . Color, Urine 01/20/2014 YELLOW  YELLOW Final  . APPearance 01/20/2014 CLEAR  CLEAR Final  . Specific Gravity, Urine 01/20/2014 1.011  1.005 - 1.030 Final  . pH 01/20/2014 7.0  5.0 - 8.0 Final  . Glucose, UA 01/20/2014 NEGATIVE  NEGATIVE mg/dL Final  . Hgb urine dipstick 01/20/2014 NEGATIVE  NEGATIVE Final  . Bilirubin Urine 01/20/2014 NEGATIVE  NEGATIVE Final  . Ketones, ur 01/20/2014 NEGATIVE  NEGATIVE mg/dL Final  . Protein, ur 01/20/2014 NEGATIVE  NEGATIVE mg/dL Final  . Urobilinogen, UA 01/20/2014 0.2  0.0 - 1.0 mg/dL Final  . Nitrite 01/20/2014 NEGATIVE  NEGATIVE Final  . Leukocytes, UA 01/20/2014 SMALL* NEGATIVE Final  . Squamous Epithelial / LPF 01/20/2014 RARE  RARE Final  . WBC, UA 01/20/2014 0-2  <3 WBC/hpf Final  . RBC / HPF 01/20/2014 3-6  <3 RBC/hpf Final  . Bacteria, UA 01/20/2014 RARE  RARE Final  . ABO/RH(D)  01/20/2014 O NEG   Final     X-Rays:Dg Chest 2 View  01/20/2014   CLINICAL DATA:  Preoperative chest radiograph. Right total knee arthroplasty.  EXAM: CHEST  2 VIEW  COMPARISON:  None.  FINDINGS: Cardiopericardial silhouette within normal limits. Mediastinal contours normal. Trachea midline. No airspace disease or effusion. Calcifications in the left upper quadrant likely represent old granulomatous disease of the spleen. Aortic arch atherosclerosis is present.  IMPRESSION: No active cardiopulmonary disease.   Electronically Signed   By: Dereck Ligas M.D.   On: 01/20/2014 11:01    EKG:No orders found for this or any previous visit.   Hospital Course: Ashley Ruiz is a 80 y.o. who was admitted to [facility]. They were brought to the operating room on 01/23/2014 and underwent Procedure(s): RIGHT TOTAL KNEE ARTHROPLASTY.  Patient tolerated the procedure well and was later transferred to the recovery room and then to the orthopaedic floor for postoperative care.  They were given PO and IV analgesics for pain control following their surgery.  They were given 24 hours of postoperative antibiotics of  Anti-infectives   Start     Dose/Rate Route Frequency Ordered Stop   01/23/14 2000  ceFAZolin (ANCEF) IVPB 1 g/50 mL premix     1 g 100 mL/hr over 30 Minutes Intravenous Every 6 hours 01/23/14 1653 01/24/14 0137   01/23/14 1038  ceFAZolin (ANCEF) IVPB 2 g/50 mL premix     2 g 100 mL/hr over 30 Minutes Intravenous On call to O.R. 01/23/14 1038 01/23/14 1400     and started on DVT prophylaxis in the form of Xarelto.   PT and OT were ordered for total joint protocol.  Discharge planning consulted to help with postop disposition and equipment needs.  Patient had a very good night on the evening of surgery and was feeling very good the next morning.  They started to get up OOB with therapy on day one. Hemovac drain was pulled without difficulty.  Continued to work with therapy into day two.   Dressing was changed on day  two and the incision was healing well. Patient was seen in rounds and was ready to go home later that same day.   Discharge Medications: Prior to Admission medications   Medication Sig Start Date End Date Taking? Authorizing Provider  acetaminophen (TYLENOL) 500 MG tablet Take 1,000 mg by mouth every 6 (six) hours as needed.   Yes Historical Provider, MD  latanoprost (XALATAN) 0.005 % ophthalmic solution Place 1 drop into both eyes at bedtime.   Yes Historical Provider, MD  losartan-hydrochlorothiazide (HYZAAR) 100-12.5 MG per tablet Take 1 tablet by mouth every morning.   Yes Historical Provider, MD  simvastatin (ZOCOR) 10 MG tablet Take 10 mg by mouth at bedtime.    Yes Historical Provider, MD  methocarbamol (ROBAXIN) 500 MG tablet Take 1 tablet (500 mg total) by mouth every 6 (six) hours as needed for muscle spasms. 01/25/14   Nirvaan Frett, PA-C  oxyCODONE (OXY IR/ROXICODONE) 5 MG immediate release tablet Take 1-2 tablets (5-10 mg total) by mouth every 3 (three) hours as needed for moderate pain, severe pain or breakthrough pain. 01/25/14   Jaimya Feliciano Dara Lords, PA-C  rivaroxaban (XARELTO) 10 MG TABS tablet Take 1 tablet (10 mg total) by mouth daily with breakfast. Take Xarelto for two and a half more weeks, then discontinue Xarelto. Once the patient has completed the Xarelto, they may resume the 81 mg Aspirin. 01/25/14   Emalia Witkop, PA-C  traMADol (ULTRAM) 50 MG tablet Take 1-2 tablets (50-100 mg total) by mouth every 6 (six) hours as needed (mild to moderate pain). 01/25/14   Niklaus Mamaril Dara Lords, PA-C   Discharge home with home health  Diet - Cardiac diet  Follow up - in 2 weeks  Activity - WBAT  Disposition - Home  Condition Upon Discharge - Good  D/C Meds - See DC Summary  DVT Prophylaxis - Xarelto   Discharge Orders   Future Orders Complete By Expires   Call MD / Call 911  As directed    Comments:     If you experience chest pain or shortness  of breath, CALL 911 and be transported to the hospital emergency room.  If you develope a fever above 101 F, pus (white drainage) or increased drainage or redness at the wound, or calf pain, call your surgeon's office.   Change dressing  As directed    Comments:     Change dressing daily with sterile 4 x 4 inch gauze dressing and apply TED hose. Do not submerge the incision under water.   Constipation Prevention  As directed    Comments:     Drink plenty of fluids.  Prune juice may be helpful.  You may use a stool softener, such as Colace (over the counter) 100 mg twice a day.  Use MiraLax (over the counter) for constipation as needed.   Diet - low sodium heart healthy  As directed    Discharge instructions  As directed    Comments:     Pick up stool softner and laxative for home. Do not submerge incision under water. May shower. Continue to use ice for pain and swelling from surgery. Take Xarelto for two and a half more weeks, then discontinue Xarelto. Once the patient has completed the Xarelto, they may resume the 81 mg Aspirin.   Do not put a pillow under the knee. Place it under the heel.  As directed    Do not sit on low chairs, stoools or toilet seats, as it may be difficult to get up  from low surfaces  As directed    Driving restrictions  As directed    Comments:     No driving until released by the physician.   Increase activity slowly as tolerated  As directed    Lifting restrictions  As directed    Comments:     No lifting until released by the physician.   Patient may shower  As directed    Comments:     You may shower without a dressing once there is no drainage.  Do not wash over the wound.  If drainage remains, do not shower until drainage stops.   TED hose  As directed    Comments:     Use stockings (TED hose) for 3 weeks on both leg(s).  You may remove them at night for sleeping.   Weight bearing as tolerated  As directed    Questions:     Laterality:     Extremity:          Medication List    STOP taking these medications       aspirin EC 81 MG tablet     Biotin 7500 MCG Tabs     Co Q 10 100 MG Caps     ibuprofen 200 MG tablet  Commonly known as:  ADVIL,MOTRIN     omega-3 acid ethyl esters 1 G capsule  Commonly known as:  LOVAZA     Vitamin D 2000 UNITS tablet      TAKE these medications       acetaminophen 500 MG tablet  Commonly known as:  TYLENOL  Take 1,000 mg by mouth every 6 (six) hours as needed.     latanoprost 0.005 % ophthalmic solution  Commonly known as:  XALATAN  Place 1 drop into both eyes at bedtime.     losartan-hydrochlorothiazide 100-12.5 MG per tablet  Commonly known as:  HYZAAR  Take 1 tablet by mouth every morning.     methocarbamol 500 MG tablet  Commonly known as:  ROBAXIN  Take 1 tablet (500 mg total) by mouth every 6 (six) hours as needed for muscle spasms.     oxyCODONE 5 MG immediate release tablet  Commonly known as:  Oxy IR/ROXICODONE  Take 1-2 tablets (5-10 mg total) by mouth every 3 (three) hours as needed for moderate pain, severe pain or breakthrough pain.     rivaroxaban 10 MG Tabs tablet  Commonly known as:  XARELTO  - Take 1 tablet (10 mg total) by mouth daily with breakfast. Take Xarelto for two and a half more weeks, then discontinue Xarelto.  - Once the patient has completed the Xarelto, they may resume the 81 mg Aspirin.     simvastatin 10 MG tablet  Commonly known as:  ZOCOR  Take 10 mg by mouth at bedtime.     traMADol 50 MG tablet  Commonly known as:  ULTRAM  Take 1-2 tablets (50-100 mg total) by mouth every 6 (six) hours as needed (mild to moderate pain).           Follow-up Information   Follow up with [facility]. [facility])    Contact information:   [place] [address] [phone]       Follow up with [name], MD. Schedule an appointment as soon as possible for a visit on 02/07/2014. (For check up.  Call [phone]  tomorrow to make the appointment)    Specialty:  Orthopedic Surgery  Contact information:   [address] [place] [address] [phone]       Signed: Mickel Crow 01/25/2014, 2:18 PM

## 2014-01-25 NOTE — Progress Notes (Signed)
   Subjective: 2 Days Post-Op Procedure(s) (LRB): RIGHT TOTAL KNEE ARTHROPLASTY (Right) Patient reports pain as mild.   Patient seen in rounds for Dr. Wynelle Link. Patient is well, and has had no acute complaints or problems Patient is ready to go home  Objective: Vital signs in last 24 hours: Temp:  [98.2 F (36.8 C)-98.5 F (36.9 C)] 98.5 F (36.9 C) (03/04 0609) Pulse Rate:  [63-71] 71 (03/04 0609) Resp:  [16] 16 (03/04 0800) BP: (128-163)/(58-77) 163/77 mmHg (03/04 0609) SpO2:  [95 %-96 %] 96 % (03/04 0800)  Intake/Output from previous day:  Intake/Output Summary (Last 24 hours) at 01/25/14 1415 Last data filed at 01/25/14 1311  Gross per 24 hour  Intake    600 ml  Output    550 ml  Net     50 ml    Intake/Output this shift: Total I/O In: 240 [P.O.:240] Out: -   Labs:  Recent Labs  01/24/14 0512 01/25/14 0438  HGB 12.6 11.4*    Recent Labs  01/24/14 0512 01/25/14 0438  WBC 11.1* 14.2*  RBC 4.52 4.15  HCT 36.4 33.9*  PLT 150 139*    Recent Labs  01/24/14 0512 01/25/14 0438  NA 139 139  K 3.8 3.8  CL 103 102  CO2 22 27  BUN 9 11  CREATININE 0.53 0.57  GLUCOSE 146* 115*  CALCIUM 8.6 8.6   No results found for this basename: LABPT, INR,  in the last 72 hours  EXAM: General - Patient is Alert and Appropriate Extremity - Neurovascular intact Sensation intact distally Incision - clean, dry, no drainage Motor Function - intact, moving foot and toes well on exam.   Assessment/Plan: 2 Days Post-Op Procedure(s) (LRB): RIGHT TOTAL KNEE ARTHROPLASTY (Right) Procedure(s) (LRB): RIGHT TOTAL KNEE ARTHROPLASTY (Right) Past Medical History  Diagnosis Date  . Hypertension   . Heart murmur   . PONV (postoperative nausea and vomiting)   . Arthritis     OA AND PAIN BOTH KNEES; SOME PAIN IN RT HIP  . Hypercholesteremia    Principal Problem:   OA (osteoarthritis) of knee  Estimated body mass index is 27.06 kg/(m^2) as calculated from the  following:   Height as of this encounter: 5\' 2"  (1.575 m).   Weight as of this encounter: 67.132 kg (148 lb). Discharge home with home health Diet - Cardiac diet Follow up - in 2 weeks Activity - WBAT Disposition - Home Condition Upon Discharge - Good D/C Meds - See DC Summary DVT Prophylaxis - Xarelto  Ashley Ruiz 01/25/2014, 2:15 PM

## 2014-01-25 NOTE — Progress Notes (Signed)
Occupational Therapy Treatment Patient Details Name: Ashley Ruiz MRN: [id number] DOB: 1929-07-16 Today's Date: 01/25/2014 Time: [id number] OT Time Calculation (min): 26 min  OT Assessment / Plan / Recommendation  History of present illness Pt is s/p R TKA   OT comments  Pt overall at min to min guard assist with toilet transfers, needing slight min assist for safety when she turns too quickly. Cautioned pt several times during session to make sure she goes slower especially with those turns. Practiced shower transfer also. She needs some verbal cues for distance of walker to self.   Follow Up Recommendations  No OT follow up;Supervision/Assistance - 24 hour    Barriers to Discharge       Equipment Recommendations  3 in 1 bedside comode    Recommendations for Other Services    Frequency Min 2X/week   Progress towards OT Goals Progress towards OT goals: Progressing toward goals  Plan Discharge plan remains appropriate    Precautions / Restrictions Precautions Precautions: Knee Required Braces or Orthoses: Knee Immobilizer - Right Knee Immobilizer - Right: Discontinue once straight leg raise with < 10 degree lag Restrictions Weight Bearing Restrictions: No Other Position/Activity Restrictions: WBAT   Pertinent Vitals/Pain 5/10 R knee at rest 4/10 after activity; reposition, ice    ADL  Grooming: Wash/dry hands;Min guard Where Assessed - Grooming: Unsupported standing Toilet Transfer: Performed;Minimal assistance;Min guard (occassionally min assist when she turns quickly with walker, cues to slow down) Toilet Transfer Equipment: Raised toilet seat with arms (or 3-in-1 over toilet) Toileting - Clothing Manipulation and Hygiene: Simulated;Min guard Where Assessed - Camera operator Manipulation and Hygiene: Standing Tub/Shower Transfer: Simulated;Minimal assistance Equipment Used: Rolling walker;Knee Immobilizer ADL Comments: Issued handout for shower transfer and reviewed  it with pt. Practiced shower transfer and educated on how husband should steady walker as she steps in and out. Emphasized need to wear KI even with transfer in and out of the shower until pt able to SLR. Discussed placement of 3in1 in shower for best safety. Pt at times still turns a little quickly and needs min assist to ensure balance but with straight path is min guard with walker. Cautioned her to make sure she takes turns slower and side stepping over to 3in1.     OT Diagnosis:    OT Problem List:   OT Treatment Interventions:     OT Goals(current goals can now be found in the care plan section)    Visit Information  Last OT Received On: 01/25/14 Assistance Needed: +1 History of Present Illness: Pt is s/p R TKA    Subjective Data      Prior Functioning       Cognition  Cognition Arousal/Alertness: Awake/alert Behavior During Therapy: WFL for tasks assessed/performed Overall Cognitive Status: Within Functional Limits for tasks assessed    Mobility  Bed Mobility Overal bed mobility: Needs Assistance Supine to sit: Supervision Transfers Overall transfer level: Needs assistance Equipment used: Rolling walker (2 wheeled) Transfers: Sit to/from Stand Sit to Stand: Min guard General transfer comment: close min guard for safety. Pt moves a little quickly.    Exercises      Balance    End of Session OT - End of Session Equipment Utilized During Treatment: Rolling walker;Right knee immobilizer Activity Tolerance: Patient tolerated treatment well Patient left: in chair;with call bell/phone within reach CPM Right Knee CPM Right Knee: Off  [place], [place] [phone] 01/25/2014, 10:42 AM

## 2014-01-25 NOTE — Progress Notes (Signed)
Physical Therapy Treatment Patient Details Name: Ashley Ruiz MRN: [id number] DOB: 19-Jan-1929 Today's Date: 01/25/2014 Time: 7371-0626 PT Time Calculation (min): 22 min  PT Assessment / Plan / Recommendation  History of Present Illness Pt is s/p R TKA   PT Comments   Pt became nauseated after walking, practicing steps. Reports she ate very little at lunch and took pills. Ready for DC.  Follow Up Recommendations  Home health PT     Does the patient have the potential to tolerate intense rehabilitation     Barriers to Discharge        Equipment Recommendations  None recommended by PT    Recommendations for Other Services    Frequency 7X/week   Progress towards PT Goals Progress towards PT goals: Progressing toward goals  Plan Current plan remains appropriate    Precautions / Restrictions Precautions Precautions: Fall;Knee Required Braces or Orthoses: Knee Immobilizer - Right Knee Immobilizer - Right: Discontinue once straight leg raise with < 10 degree lag Restrictions Weight Bearing Restrictions: No Other Position/Activity Restrictions: WBAT   Pertinent Vitals/Pain 5 R knee    Mobility  Bed Mobility Overal bed mobility: Needs Assistance Bed Mobility: Supine to Sit Supine to sit: Supervision Sit to supine: Min assist General bed mobility comments: support R leg onto bed. Transfers Overall transfer level: Needs assistance Equipment used: Rolling walker (2 wheeled) Transfers: Sit to/from Stand Sit to Stand: Min guard General transfer comment: close min guard for safety. Pt moves a little quickly. Ambulation/Gait Ambulation/Gait assistance: Min guard Ambulation Distance (Feet): 100 Feet Gait Pattern/deviations: Step-to pattern;Antalgic General Gait Details: cues for sequence. Stairs: Yes Stairs assistance: Min assist Stair Management: One rail Left;Alternating pattern;Forwards Number of Stairs: 2 General stair comments: Souse held R UE  for rail support.     Exercises Total Joint Exercises Ankle Circles/Pumps: AROM;Both;10 reps;Supine Quad Sets: AROM;Both;10 reps;Supine Short Arc Quad: AROM;Right;10 reps;Supine Heel Slides: AAROM;Right;10 reps;Supine Hip ABduction/ADduction: AROM;Right;10 reps;Supine Straight Leg Raises: AAROM;Right;10 reps;Supine Goniometric ROM: 5-50   PT Diagnosis:    PT Problem List:   PT Treatment Interventions:     PT Goals (current goals can now be found in the care plan section)    Visit Information  Last PT Received On: 01/25/14 Assistance Needed: +1 History of Present Illness: Pt is s/p R TKA    Subjective Data      Cognition  Cognition Arousal/Alertness: Awake/alert Behavior During Therapy: WFL for tasks assessed/performed Overall Cognitive Status: Within Functional Limits for tasks assessed    Balance     End of Session PT - End of Session Activity Tolerance: Patient tolerated treatment well Patient left: in bed;with call bell/phone within reach Nurse Communication: Mobility status   GP     Claretha Cooper 01/25/2014, 1:51 PM

## 2014-01-25 NOTE — Progress Notes (Signed)
Discharged from floor via w/c, family with pt. No changes in assessment. Ashley Ruiz  

## 2014-01-26 DIAGNOSIS — IMO0001 Reserved for inherently not codable concepts without codable children: Secondary | ICD-10-CM | POA: Diagnosis not present

## 2014-01-26 DIAGNOSIS — Z471 Aftercare following joint replacement surgery: Secondary | ICD-10-CM | POA: Diagnosis not present

## 2014-01-26 DIAGNOSIS — Z96659 Presence of unspecified artificial knee joint: Secondary | ICD-10-CM | POA: Diagnosis not present

## 2014-01-26 DIAGNOSIS — I10 Essential (primary) hypertension: Secondary | ICD-10-CM | POA: Diagnosis not present

## 2014-01-27 DIAGNOSIS — IMO0001 Reserved for inherently not codable concepts without codable children: Secondary | ICD-10-CM | POA: Diagnosis not present

## 2014-01-27 DIAGNOSIS — Z96659 Presence of unspecified artificial knee joint: Secondary | ICD-10-CM | POA: Diagnosis not present

## 2014-01-27 DIAGNOSIS — I10 Essential (primary) hypertension: Secondary | ICD-10-CM | POA: Diagnosis not present

## 2014-01-27 DIAGNOSIS — Z471 Aftercare following joint replacement surgery: Secondary | ICD-10-CM | POA: Diagnosis not present

## 2014-01-30 DIAGNOSIS — Z96659 Presence of unspecified artificial knee joint: Secondary | ICD-10-CM | POA: Diagnosis not present

## 2014-01-30 DIAGNOSIS — IMO0001 Reserved for inherently not codable concepts without codable children: Secondary | ICD-10-CM | POA: Diagnosis not present

## 2014-01-30 DIAGNOSIS — I10 Essential (primary) hypertension: Secondary | ICD-10-CM | POA: Diagnosis not present

## 2014-01-30 DIAGNOSIS — Z471 Aftercare following joint replacement surgery: Secondary | ICD-10-CM | POA: Diagnosis not present

## 2014-01-31 DIAGNOSIS — Z96659 Presence of unspecified artificial knee joint: Secondary | ICD-10-CM | POA: Diagnosis not present

## 2014-01-31 DIAGNOSIS — I10 Essential (primary) hypertension: Secondary | ICD-10-CM | POA: Diagnosis not present

## 2014-01-31 DIAGNOSIS — IMO0001 Reserved for inherently not codable concepts without codable children: Secondary | ICD-10-CM | POA: Diagnosis not present

## 2014-01-31 DIAGNOSIS — Z471 Aftercare following joint replacement surgery: Secondary | ICD-10-CM | POA: Diagnosis not present

## 2014-02-01 DIAGNOSIS — Z471 Aftercare following joint replacement surgery: Secondary | ICD-10-CM | POA: Diagnosis not present

## 2014-02-01 DIAGNOSIS — IMO0001 Reserved for inherently not codable concepts without codable children: Secondary | ICD-10-CM | POA: Diagnosis not present

## 2014-02-01 DIAGNOSIS — Z96659 Presence of unspecified artificial knee joint: Secondary | ICD-10-CM | POA: Diagnosis not present

## 2014-02-01 DIAGNOSIS — I10 Essential (primary) hypertension: Secondary | ICD-10-CM | POA: Diagnosis not present

## 2014-02-02 DIAGNOSIS — Z96659 Presence of unspecified artificial knee joint: Secondary | ICD-10-CM | POA: Diagnosis not present

## 2014-02-02 DIAGNOSIS — I10 Essential (primary) hypertension: Secondary | ICD-10-CM | POA: Diagnosis not present

## 2014-02-02 DIAGNOSIS — IMO0001 Reserved for inherently not codable concepts without codable children: Secondary | ICD-10-CM | POA: Diagnosis not present

## 2014-02-02 DIAGNOSIS — Z471 Aftercare following joint replacement surgery: Secondary | ICD-10-CM | POA: Diagnosis not present

## 2014-02-03 DIAGNOSIS — IMO0001 Reserved for inherently not codable concepts without codable children: Secondary | ICD-10-CM | POA: Diagnosis not present

## 2014-02-03 DIAGNOSIS — Z96659 Presence of unspecified artificial knee joint: Secondary | ICD-10-CM | POA: Diagnosis not present

## 2014-02-03 DIAGNOSIS — Z471 Aftercare following joint replacement surgery: Secondary | ICD-10-CM | POA: Diagnosis not present

## 2014-02-03 DIAGNOSIS — I10 Essential (primary) hypertension: Secondary | ICD-10-CM | POA: Diagnosis not present

## 2014-02-06 DIAGNOSIS — Z471 Aftercare following joint replacement surgery: Secondary | ICD-10-CM | POA: Diagnosis not present

## 2014-02-06 DIAGNOSIS — Z96659 Presence of unspecified artificial knee joint: Secondary | ICD-10-CM | POA: Diagnosis not present

## 2014-02-06 DIAGNOSIS — IMO0001 Reserved for inherently not codable concepts without codable children: Secondary | ICD-10-CM | POA: Diagnosis not present

## 2014-02-06 DIAGNOSIS — I10 Essential (primary) hypertension: Secondary | ICD-10-CM | POA: Diagnosis not present

## 2014-02-08 DIAGNOSIS — IMO0001 Reserved for inherently not codable concepts without codable children: Secondary | ICD-10-CM | POA: Diagnosis not present

## 2014-02-08 DIAGNOSIS — Z471 Aftercare following joint replacement surgery: Secondary | ICD-10-CM | POA: Diagnosis not present

## 2014-02-08 DIAGNOSIS — Z96659 Presence of unspecified artificial knee joint: Secondary | ICD-10-CM | POA: Diagnosis not present

## 2014-02-08 DIAGNOSIS — I10 Essential (primary) hypertension: Secondary | ICD-10-CM | POA: Diagnosis not present

## 2014-02-10 DIAGNOSIS — Z471 Aftercare following joint replacement surgery: Secondary | ICD-10-CM | POA: Diagnosis not present

## 2014-02-10 DIAGNOSIS — IMO0001 Reserved for inherently not codable concepts without codable children: Secondary | ICD-10-CM | POA: Diagnosis not present

## 2014-02-10 DIAGNOSIS — I10 Essential (primary) hypertension: Secondary | ICD-10-CM | POA: Diagnosis not present

## 2014-02-10 DIAGNOSIS — Z96659 Presence of unspecified artificial knee joint: Secondary | ICD-10-CM | POA: Diagnosis not present

## 2014-02-13 DIAGNOSIS — Z96659 Presence of unspecified artificial knee joint: Secondary | ICD-10-CM | POA: Diagnosis not present

## 2014-02-13 DIAGNOSIS — M6281 Muscle weakness (generalized): Secondary | ICD-10-CM | POA: Diagnosis not present

## 2014-02-13 DIAGNOSIS — R269 Unspecified abnormalities of gait and mobility: Secondary | ICD-10-CM | POA: Diagnosis not present

## 2014-02-15 DIAGNOSIS — Z96659 Presence of unspecified artificial knee joint: Secondary | ICD-10-CM | POA: Diagnosis not present

## 2014-02-15 DIAGNOSIS — R269 Unspecified abnormalities of gait and mobility: Secondary | ICD-10-CM | POA: Diagnosis not present

## 2014-02-15 DIAGNOSIS — M6281 Muscle weakness (generalized): Secondary | ICD-10-CM | POA: Diagnosis not present

## 2014-02-17 DIAGNOSIS — M6281 Muscle weakness (generalized): Secondary | ICD-10-CM | POA: Diagnosis not present

## 2014-02-17 DIAGNOSIS — R269 Unspecified abnormalities of gait and mobility: Secondary | ICD-10-CM | POA: Diagnosis not present

## 2014-02-17 DIAGNOSIS — Z96659 Presence of unspecified artificial knee joint: Secondary | ICD-10-CM | POA: Diagnosis not present

## 2014-02-20 DIAGNOSIS — R269 Unspecified abnormalities of gait and mobility: Secondary | ICD-10-CM | POA: Diagnosis not present

## 2014-02-20 DIAGNOSIS — Z96659 Presence of unspecified artificial knee joint: Secondary | ICD-10-CM | POA: Diagnosis not present

## 2014-02-20 DIAGNOSIS — M6281 Muscle weakness (generalized): Secondary | ICD-10-CM | POA: Diagnosis not present

## 2014-02-22 DIAGNOSIS — Z96659 Presence of unspecified artificial knee joint: Secondary | ICD-10-CM | POA: Diagnosis not present

## 2014-02-22 DIAGNOSIS — M6281 Muscle weakness (generalized): Secondary | ICD-10-CM | POA: Diagnosis not present

## 2014-02-22 DIAGNOSIS — R269 Unspecified abnormalities of gait and mobility: Secondary | ICD-10-CM | POA: Diagnosis not present

## 2014-02-24 DIAGNOSIS — Z96659 Presence of unspecified artificial knee joint: Secondary | ICD-10-CM | POA: Diagnosis not present

## 2014-02-24 DIAGNOSIS — R269 Unspecified abnormalities of gait and mobility: Secondary | ICD-10-CM | POA: Diagnosis not present

## 2014-02-24 DIAGNOSIS — M6281 Muscle weakness (generalized): Secondary | ICD-10-CM | POA: Diagnosis not present

## 2014-02-27 DIAGNOSIS — Z96659 Presence of unspecified artificial knee joint: Secondary | ICD-10-CM | POA: Diagnosis not present

## 2014-02-27 DIAGNOSIS — M6281 Muscle weakness (generalized): Secondary | ICD-10-CM | POA: Diagnosis not present

## 2014-02-27 DIAGNOSIS — R269 Unspecified abnormalities of gait and mobility: Secondary | ICD-10-CM | POA: Diagnosis not present

## 2014-02-28 DIAGNOSIS — M1710 Unilateral primary osteoarthritis, unspecified knee: Secondary | ICD-10-CM | POA: Diagnosis not present

## 2014-03-01 DIAGNOSIS — R269 Unspecified abnormalities of gait and mobility: Secondary | ICD-10-CM | POA: Diagnosis not present

## 2014-03-01 DIAGNOSIS — M6281 Muscle weakness (generalized): Secondary | ICD-10-CM | POA: Diagnosis not present

## 2014-03-01 DIAGNOSIS — Z96659 Presence of unspecified artificial knee joint: Secondary | ICD-10-CM | POA: Diagnosis not present

## 2014-03-10 DIAGNOSIS — H5210 Myopia, unspecified eye: Secondary | ICD-10-CM | POA: Diagnosis not present

## 2014-03-10 DIAGNOSIS — H40019 Open angle with borderline findings, low risk, unspecified eye: Secondary | ICD-10-CM | POA: Diagnosis not present

## 2014-03-30 DIAGNOSIS — M899 Disorder of bone, unspecified: Secondary | ICD-10-CM | POA: Diagnosis not present

## 2014-03-30 DIAGNOSIS — I10 Essential (primary) hypertension: Secondary | ICD-10-CM | POA: Diagnosis not present

## 2014-03-30 DIAGNOSIS — Z780 Asymptomatic menopausal state: Secondary | ICD-10-CM | POA: Diagnosis not present

## 2014-03-30 DIAGNOSIS — I359 Nonrheumatic aortic valve disorder, unspecified: Secondary | ICD-10-CM | POA: Diagnosis not present

## 2014-03-30 DIAGNOSIS — Z0000 Encounter for general adult medical examination without abnormal findings: Secondary | ICD-10-CM | POA: Diagnosis not present

## 2014-03-30 DIAGNOSIS — E782 Mixed hyperlipidemia: Secondary | ICD-10-CM | POA: Diagnosis not present

## 2014-03-30 DIAGNOSIS — Z79899 Other long term (current) drug therapy: Secondary | ICD-10-CM | POA: Diagnosis not present

## 2014-03-30 DIAGNOSIS — M949 Disorder of cartilage, unspecified: Secondary | ICD-10-CM | POA: Diagnosis not present

## 2014-06-23 DIAGNOSIS — M1710 Unilateral primary osteoarthritis, unspecified knee: Secondary | ICD-10-CM | POA: Diagnosis not present

## 2014-07-11 DIAGNOSIS — M546 Pain in thoracic spine: Secondary | ICD-10-CM | POA: Diagnosis not present

## 2014-07-11 DIAGNOSIS — M545 Low back pain, unspecified: Secondary | ICD-10-CM | POA: Diagnosis not present

## 2014-07-13 DIAGNOSIS — M545 Low back pain, unspecified: Secondary | ICD-10-CM | POA: Diagnosis not present

## 2014-07-13 DIAGNOSIS — M546 Pain in thoracic spine: Secondary | ICD-10-CM | POA: Diagnosis not present

## 2014-08-22 DIAGNOSIS — Z23 Encounter for immunization: Secondary | ICD-10-CM | POA: Diagnosis not present

## 2014-08-30 DIAGNOSIS — R222 Localized swelling, mass and lump, trunk: Secondary | ICD-10-CM | POA: Diagnosis not present

## 2014-08-30 DIAGNOSIS — Z23 Encounter for immunization: Secondary | ICD-10-CM | POA: Diagnosis not present

## 2014-09-11 DIAGNOSIS — Z1231 Encounter for screening mammogram for malignant neoplasm of breast: Secondary | ICD-10-CM | POA: Diagnosis not present

## 2014-10-03 DIAGNOSIS — M25551 Pain in right hip: Secondary | ICD-10-CM | POA: Diagnosis not present

## 2014-10-24 DIAGNOSIS — M25551 Pain in right hip: Secondary | ICD-10-CM | POA: Diagnosis not present

## 2014-10-24 DIAGNOSIS — M1611 Unilateral primary osteoarthritis, right hip: Secondary | ICD-10-CM | POA: Diagnosis not present

## 2014-11-06 DIAGNOSIS — H40013 Open angle with borderline findings, low risk, bilateral: Secondary | ICD-10-CM | POA: Diagnosis not present

## 2014-11-22 DIAGNOSIS — L570 Actinic keratosis: Secondary | ICD-10-CM | POA: Diagnosis not present

## 2014-12-06 DIAGNOSIS — M1611 Unilateral primary osteoarthritis, right hip: Secondary | ICD-10-CM | POA: Diagnosis not present

## 2014-12-06 DIAGNOSIS — Z96651 Presence of right artificial knee joint: Secondary | ICD-10-CM | POA: Diagnosis not present

## 2014-12-06 DIAGNOSIS — Z471 Aftercare following joint replacement surgery: Secondary | ICD-10-CM | POA: Diagnosis not present

## 2014-12-06 DIAGNOSIS — M25551 Pain in right hip: Secondary | ICD-10-CM | POA: Diagnosis not present

## 2015-01-01 DIAGNOSIS — R05 Cough: Secondary | ICD-10-CM | POA: Diagnosis not present

## 2015-01-01 DIAGNOSIS — J302 Other seasonal allergic rhinitis: Secondary | ICD-10-CM | POA: Diagnosis not present

## 2015-01-22 DIAGNOSIS — G459 Transient cerebral ischemic attack, unspecified: Secondary | ICD-10-CM | POA: Diagnosis not present

## 2015-01-30 DIAGNOSIS — I351 Nonrheumatic aortic (valve) insufficiency: Secondary | ICD-10-CM | POA: Diagnosis not present

## 2015-01-30 DIAGNOSIS — I340 Nonrheumatic mitral (valve) insufficiency: Secondary | ICD-10-CM | POA: Diagnosis not present

## 2015-01-30 DIAGNOSIS — I361 Nonrheumatic tricuspid (valve) insufficiency: Secondary | ICD-10-CM | POA: Diagnosis not present

## 2015-01-30 DIAGNOSIS — G459 Transient cerebral ischemic attack, unspecified: Secondary | ICD-10-CM | POA: Diagnosis not present

## 2015-01-30 DIAGNOSIS — E041 Nontoxic single thyroid nodule: Secondary | ICD-10-CM | POA: Diagnosis not present

## 2015-02-15 DIAGNOSIS — I350 Nonrheumatic aortic (valve) stenosis: Secondary | ICD-10-CM | POA: Diagnosis not present

## 2015-02-15 DIAGNOSIS — G459 Transient cerebral ischemic attack, unspecified: Secondary | ICD-10-CM | POA: Diagnosis not present

## 2015-02-15 DIAGNOSIS — I340 Nonrheumatic mitral (valve) insufficiency: Secondary | ICD-10-CM | POA: Diagnosis not present

## 2015-03-14 DIAGNOSIS — M1611 Unilateral primary osteoarthritis, right hip: Secondary | ICD-10-CM | POA: Diagnosis not present

## 2015-03-18 DIAGNOSIS — I350 Nonrheumatic aortic (valve) stenosis: Secondary | ICD-10-CM | POA: Diagnosis not present

## 2015-03-18 DIAGNOSIS — R9431 Abnormal electrocardiogram [ECG] [EKG]: Secondary | ICD-10-CM | POA: Diagnosis not present

## 2015-03-18 DIAGNOSIS — H6500 Acute serous otitis media, unspecified ear: Secondary | ICD-10-CM | POA: Diagnosis not present

## 2015-03-18 DIAGNOSIS — J0100 Acute maxillary sinusitis, unspecified: Secondary | ICD-10-CM | POA: Diagnosis not present

## 2015-03-19 DIAGNOSIS — H40003 Preglaucoma, unspecified, bilateral: Secondary | ICD-10-CM | POA: Diagnosis not present

## 2015-03-23 DIAGNOSIS — M1712 Unilateral primary osteoarthritis, left knee: Secondary | ICD-10-CM | POA: Diagnosis not present

## 2015-04-02 DIAGNOSIS — I10 Essential (primary) hypertension: Secondary | ICD-10-CM | POA: Diagnosis not present

## 2015-04-02 DIAGNOSIS — E782 Mixed hyperlipidemia: Secondary | ICD-10-CM | POA: Diagnosis not present

## 2015-04-02 DIAGNOSIS — I350 Nonrheumatic aortic (valve) stenosis: Secondary | ICD-10-CM | POA: Diagnosis not present

## 2015-04-02 DIAGNOSIS — Z79899 Other long term (current) drug therapy: Secondary | ICD-10-CM | POA: Diagnosis not present

## 2015-05-18 DIAGNOSIS — I10 Essential (primary) hypertension: Secondary | ICD-10-CM | POA: Diagnosis not present

## 2015-05-18 DIAGNOSIS — E780 Pure hypercholesterolemia: Secondary | ICD-10-CM | POA: Diagnosis not present

## 2015-05-29 DIAGNOSIS — Z471 Aftercare following joint replacement surgery: Secondary | ICD-10-CM | POA: Diagnosis not present

## 2015-05-29 DIAGNOSIS — Z96651 Presence of right artificial knee joint: Secondary | ICD-10-CM | POA: Diagnosis not present

## 2015-05-29 DIAGNOSIS — M1712 Unilateral primary osteoarthritis, left knee: Secondary | ICD-10-CM | POA: Diagnosis not present

## 2015-05-29 DIAGNOSIS — M1611 Unilateral primary osteoarthritis, right hip: Secondary | ICD-10-CM | POA: Diagnosis not present

## 2015-06-01 DIAGNOSIS — M25561 Pain in right knee: Secondary | ICD-10-CM | POA: Diagnosis not present

## 2015-06-01 DIAGNOSIS — S8991XA Unspecified injury of right lower leg, initial encounter: Secondary | ICD-10-CM | POA: Diagnosis not present

## 2015-06-01 DIAGNOSIS — S81811A Laceration without foreign body, right lower leg, initial encounter: Secondary | ICD-10-CM | POA: Diagnosis not present

## 2015-06-06 DIAGNOSIS — L089 Local infection of the skin and subcutaneous tissue, unspecified: Secondary | ICD-10-CM | POA: Diagnosis not present

## 2015-06-06 DIAGNOSIS — Z1389 Encounter for screening for other disorder: Secondary | ICD-10-CM | POA: Diagnosis not present

## 2015-06-06 DIAGNOSIS — T148 Other injury of unspecified body region: Secondary | ICD-10-CM | POA: Diagnosis not present

## 2015-06-11 DIAGNOSIS — T148 Other injury of unspecified body region: Secondary | ICD-10-CM | POA: Diagnosis not present

## 2015-06-11 DIAGNOSIS — L089 Local infection of the skin and subcutaneous tissue, unspecified: Secondary | ICD-10-CM | POA: Diagnosis not present

## 2015-06-15 DIAGNOSIS — L089 Local infection of the skin and subcutaneous tissue, unspecified: Secondary | ICD-10-CM | POA: Diagnosis not present

## 2015-06-15 DIAGNOSIS — T148 Other injury of unspecified body region: Secondary | ICD-10-CM | POA: Diagnosis not present

## 2015-06-17 DIAGNOSIS — E780 Pure hypercholesterolemia: Secondary | ICD-10-CM | POA: Diagnosis not present

## 2015-06-17 DIAGNOSIS — I10 Essential (primary) hypertension: Secondary | ICD-10-CM | POA: Diagnosis not present

## 2015-06-25 DIAGNOSIS — L089 Local infection of the skin and subcutaneous tissue, unspecified: Secondary | ICD-10-CM | POA: Diagnosis not present

## 2015-06-25 DIAGNOSIS — T148 Other injury of unspecified body region: Secondary | ICD-10-CM | POA: Diagnosis not present

## 2015-06-28 DIAGNOSIS — T148 Other injury of unspecified body region: Secondary | ICD-10-CM | POA: Diagnosis not present

## 2015-06-28 DIAGNOSIS — L089 Local infection of the skin and subcutaneous tissue, unspecified: Secondary | ICD-10-CM | POA: Diagnosis not present

## 2015-06-29 DIAGNOSIS — I10 Essential (primary) hypertension: Secondary | ICD-10-CM | POA: Diagnosis not present

## 2015-06-29 DIAGNOSIS — Z48817 Encounter for surgical aftercare following surgery on the skin and subcutaneous tissue: Secondary | ICD-10-CM | POA: Diagnosis not present

## 2015-06-29 DIAGNOSIS — I2510 Atherosclerotic heart disease of native coronary artery without angina pectoris: Secondary | ICD-10-CM | POA: Diagnosis not present

## 2015-06-29 DIAGNOSIS — Z87891 Personal history of nicotine dependence: Secondary | ICD-10-CM | POA: Diagnosis not present

## 2015-06-29 DIAGNOSIS — T8133XA Disruption of traumatic injury wound repair, initial encounter: Secondary | ICD-10-CM | POA: Diagnosis not present

## 2015-06-29 DIAGNOSIS — T8131XA Disruption of external operation (surgical) wound, not elsewhere classified, initial encounter: Secondary | ICD-10-CM | POA: Diagnosis not present

## 2015-07-01 DIAGNOSIS — Z79899 Other long term (current) drug therapy: Secondary | ICD-10-CM | POA: Diagnosis not present

## 2015-07-01 DIAGNOSIS — R110 Nausea: Secondary | ICD-10-CM | POA: Diagnosis not present

## 2015-07-01 DIAGNOSIS — R197 Diarrhea, unspecified: Secondary | ICD-10-CM | POA: Diagnosis not present

## 2015-07-01 DIAGNOSIS — Z7902 Long term (current) use of antithrombotics/antiplatelets: Secondary | ICD-10-CM | POA: Diagnosis not present

## 2015-07-01 DIAGNOSIS — I10 Essential (primary) hypertension: Secondary | ICD-10-CM | POA: Diagnosis not present

## 2015-07-06 DIAGNOSIS — T8133XD Disruption of traumatic injury wound repair, subsequent encounter: Secondary | ICD-10-CM | POA: Diagnosis not present

## 2015-07-06 DIAGNOSIS — T8131XA Disruption of external operation (surgical) wound, not elsewhere classified, initial encounter: Secondary | ICD-10-CM | POA: Diagnosis not present

## 2015-07-06 DIAGNOSIS — Z48817 Encounter for surgical aftercare following surgery on the skin and subcutaneous tissue: Secondary | ICD-10-CM | POA: Diagnosis not present

## 2015-07-12 DIAGNOSIS — M1611 Unilateral primary osteoarthritis, right hip: Secondary | ICD-10-CM | POA: Diagnosis not present

## 2015-07-12 DIAGNOSIS — M1712 Unilateral primary osteoarthritis, left knee: Secondary | ICD-10-CM | POA: Diagnosis not present

## 2015-07-13 DIAGNOSIS — T8131XA Disruption of external operation (surgical) wound, not elsewhere classified, initial encounter: Secondary | ICD-10-CM | POA: Diagnosis not present

## 2015-07-13 DIAGNOSIS — T8133XD Disruption of traumatic injury wound repair, subsequent encounter: Secondary | ICD-10-CM | POA: Diagnosis not present

## 2015-07-18 DIAGNOSIS — J309 Allergic rhinitis, unspecified: Secondary | ICD-10-CM | POA: Diagnosis not present

## 2015-07-18 DIAGNOSIS — I499 Cardiac arrhythmia, unspecified: Secondary | ICD-10-CM | POA: Diagnosis not present

## 2015-07-18 DIAGNOSIS — D649 Anemia, unspecified: Secondary | ICD-10-CM | POA: Diagnosis not present

## 2015-07-18 DIAGNOSIS — E782 Mixed hyperlipidemia: Secondary | ICD-10-CM | POA: Diagnosis not present

## 2015-07-23 DIAGNOSIS — T8133XD Disruption of traumatic injury wound repair, subsequent encounter: Secondary | ICD-10-CM | POA: Diagnosis not present

## 2015-07-23 DIAGNOSIS — Z48817 Encounter for surgical aftercare following surgery on the skin and subcutaneous tissue: Secondary | ICD-10-CM | POA: Diagnosis not present

## 2015-07-27 DIAGNOSIS — Z1389 Encounter for screening for other disorder: Secondary | ICD-10-CM | POA: Diagnosis not present

## 2015-07-27 DIAGNOSIS — Z23 Encounter for immunization: Secondary | ICD-10-CM | POA: Diagnosis not present

## 2015-07-27 DIAGNOSIS — I10 Essential (primary) hypertension: Secondary | ICD-10-CM | POA: Diagnosis not present

## 2015-07-31 DIAGNOSIS — T8133XD Disruption of traumatic injury wound repair, subsequent encounter: Secondary | ICD-10-CM | POA: Diagnosis not present

## 2015-07-31 DIAGNOSIS — T8131XA Disruption of external operation (surgical) wound, not elsewhere classified, initial encounter: Secondary | ICD-10-CM | POA: Diagnosis not present

## 2015-08-06 DIAGNOSIS — T8133XD Disruption of traumatic injury wound repair, subsequent encounter: Secondary | ICD-10-CM | POA: Diagnosis not present

## 2015-08-06 DIAGNOSIS — T8131XA Disruption of external operation (surgical) wound, not elsewhere classified, initial encounter: Secondary | ICD-10-CM | POA: Diagnosis not present

## 2015-08-07 DIAGNOSIS — M1611 Unilateral primary osteoarthritis, right hip: Secondary | ICD-10-CM | POA: Diagnosis not present

## 2015-08-10 DIAGNOSIS — Z1389 Encounter for screening for other disorder: Secondary | ICD-10-CM | POA: Diagnosis not present

## 2015-08-10 DIAGNOSIS — I10 Essential (primary) hypertension: Secondary | ICD-10-CM | POA: Diagnosis not present

## 2015-08-13 DIAGNOSIS — Z48817 Encounter for surgical aftercare following surgery on the skin and subcutaneous tissue: Secondary | ICD-10-CM | POA: Diagnosis not present

## 2015-08-13 DIAGNOSIS — T8133XD Disruption of traumatic injury wound repair, subsequent encounter: Secondary | ICD-10-CM | POA: Diagnosis not present

## 2015-08-20 DIAGNOSIS — Z48817 Encounter for surgical aftercare following surgery on the skin and subcutaneous tissue: Secondary | ICD-10-CM | POA: Diagnosis not present

## 2015-08-20 DIAGNOSIS — T8133XD Disruption of traumatic injury wound repair, subsequent encounter: Secondary | ICD-10-CM | POA: Diagnosis not present

## 2015-08-27 DIAGNOSIS — T8133XD Disruption of traumatic injury wound repair, subsequent encounter: Secondary | ICD-10-CM | POA: Diagnosis not present

## 2015-08-27 DIAGNOSIS — T8131XA Disruption of external operation (surgical) wound, not elsewhere classified, initial encounter: Secondary | ICD-10-CM | POA: Diagnosis not present

## 2015-08-31 DIAGNOSIS — H40003 Preglaucoma, unspecified, bilateral: Secondary | ICD-10-CM | POA: Diagnosis not present

## 2015-09-03 DIAGNOSIS — T8133XD Disruption of traumatic injury wound repair, subsequent encounter: Secondary | ICD-10-CM | POA: Diagnosis not present

## 2015-09-03 DIAGNOSIS — T8131XA Disruption of external operation (surgical) wound, not elsewhere classified, initial encounter: Secondary | ICD-10-CM | POA: Diagnosis not present

## 2015-09-06 DIAGNOSIS — J069 Acute upper respiratory infection, unspecified: Secondary | ICD-10-CM | POA: Diagnosis not present

## 2015-09-10 DIAGNOSIS — T8133XD Disruption of traumatic injury wound repair, subsequent encounter: Secondary | ICD-10-CM | POA: Diagnosis not present

## 2015-09-10 DIAGNOSIS — Z48817 Encounter for surgical aftercare following surgery on the skin and subcutaneous tissue: Secondary | ICD-10-CM | POA: Diagnosis not present

## 2015-09-17 DIAGNOSIS — Z48817 Encounter for surgical aftercare following surgery on the skin and subcutaneous tissue: Secondary | ICD-10-CM | POA: Diagnosis not present

## 2015-09-17 DIAGNOSIS — T8133XD Disruption of traumatic injury wound repair, subsequent encounter: Secondary | ICD-10-CM | POA: Diagnosis not present

## 2015-09-26 DIAGNOSIS — Z48817 Encounter for surgical aftercare following surgery on the skin and subcutaneous tissue: Secondary | ICD-10-CM | POA: Diagnosis not present

## 2015-09-26 DIAGNOSIS — T8133XD Disruption of traumatic injury wound repair, subsequent encounter: Secondary | ICD-10-CM | POA: Diagnosis not present

## 2015-10-01 DIAGNOSIS — T8133XD Disruption of traumatic injury wound repair, subsequent encounter: Secondary | ICD-10-CM | POA: Diagnosis not present

## 2015-10-01 DIAGNOSIS — Z48817 Encounter for surgical aftercare following surgery on the skin and subcutaneous tissue: Secondary | ICD-10-CM | POA: Diagnosis not present

## 2015-10-08 DIAGNOSIS — Z48817 Encounter for surgical aftercare following surgery on the skin and subcutaneous tissue: Secondary | ICD-10-CM | POA: Diagnosis not present

## 2015-10-08 DIAGNOSIS — Z872 Personal history of diseases of the skin and subcutaneous tissue: Secondary | ICD-10-CM | POA: Diagnosis not present

## 2015-10-08 DIAGNOSIS — Z09 Encounter for follow-up examination after completed treatment for conditions other than malignant neoplasm: Secondary | ICD-10-CM | POA: Diagnosis not present

## 2015-10-09 DIAGNOSIS — M1611 Unilateral primary osteoarthritis, right hip: Secondary | ICD-10-CM | POA: Diagnosis not present

## 2015-10-25 DIAGNOSIS — I350 Nonrheumatic aortic (valve) stenosis: Secondary | ICD-10-CM | POA: Diagnosis not present

## 2015-10-25 DIAGNOSIS — G459 Transient cerebral ischemic attack, unspecified: Secondary | ICD-10-CM | POA: Diagnosis not present

## 2015-10-25 DIAGNOSIS — I10 Essential (primary) hypertension: Secondary | ICD-10-CM | POA: Diagnosis not present

## 2015-10-31 DIAGNOSIS — M1611 Unilateral primary osteoarthritis, right hip: Secondary | ICD-10-CM | POA: Diagnosis not present

## 2015-11-08 DIAGNOSIS — M1712 Unilateral primary osteoarthritis, left knee: Secondary | ICD-10-CM | POA: Diagnosis not present

## 2015-11-15 DIAGNOSIS — L989 Disorder of the skin and subcutaneous tissue, unspecified: Secondary | ICD-10-CM | POA: Diagnosis not present

## 2015-11-27 DIAGNOSIS — L820 Inflamed seborrheic keratosis: Secondary | ICD-10-CM | POA: Diagnosis not present

## 2015-11-27 DIAGNOSIS — H61002 Unspecified perichondritis of left external ear: Secondary | ICD-10-CM | POA: Diagnosis not present

## 2015-12-04 DIAGNOSIS — C44229 Squamous cell carcinoma of skin of left ear and external auricular canal: Secondary | ICD-10-CM | POA: Diagnosis not present

## 2015-12-12 DIAGNOSIS — H524 Presbyopia: Secondary | ICD-10-CM | POA: Diagnosis not present

## 2015-12-27 DIAGNOSIS — Z96651 Presence of right artificial knee joint: Secondary | ICD-10-CM | POA: Diagnosis not present

## 2015-12-27 DIAGNOSIS — M1611 Unilateral primary osteoarthritis, right hip: Secondary | ICD-10-CM | POA: Diagnosis not present

## 2015-12-27 DIAGNOSIS — M1712 Unilateral primary osteoarthritis, left knee: Secondary | ICD-10-CM | POA: Diagnosis not present

## 2015-12-27 DIAGNOSIS — Z471 Aftercare following joint replacement surgery: Secondary | ICD-10-CM | POA: Diagnosis not present

## 2015-12-28 ENCOUNTER — Ambulatory Visit: Payer: Self-pay | Admitting: Orthopedic Surgery

## 2015-12-28 DIAGNOSIS — I080 Rheumatic disorders of both mitral and aortic valves: Secondary | ICD-10-CM | POA: Diagnosis not present

## 2015-12-28 DIAGNOSIS — I350 Nonrheumatic aortic (valve) stenosis: Secondary | ICD-10-CM | POA: Diagnosis not present

## 2015-12-28 NOTE — H&P (Signed)
Ashley Ruiz DOB: [id number] Married / Language: English / Race: White Female Date of Admission:  01/18/2016 CC:  Right Hip Pain History of Present Illness The patient is a 82 year old female who comes in for a preoperative History and Physical. The patient is scheduled for a right total hip arthroplasty (anterior) to be performed by Dr. Dione Plover. Aluisio, MD at [facility] on 01-18-2016. The patient is a 82 year old female who presented for follow up of their hip. The patient is being followed for their right hip pain and osteoarthritis. They are several months out from I-A injection with Dr. Nelva Bush. Symptoms reported include: pain (with weightbearing), pain with weightbearing and difficulty ambulating. The patient feels that they are doing poorly and report their pain level to be severe. The following medication has been used for pain control: Ultram and ibuprofen. She states that her right hip has gotten progressively worse. The injection helped for couple of weeks. She is at a stage now where the hips essentially taken all her life. She is ready to go ahead and get this replaced. We had previously discussed the hip replacement. She is now ready to proceed with surgery at this time. They have been treated conservatively in the past for the above stated problem and despite conservative measures, they continue to have progressive pain and severe functional limitations and dysfunction. They have failed non-operative management including home exercise, medications, and injections. It is felt that they would benefit from undergoing total joint replacement. Risks and benefits of the procedure have been discussed with the patient and they elect to proceed with surgery. There are no active contraindications to surgery such as ongoing infection or rapidly progressive neurological disease.  Problem List/Past Medical Arthralgia of right hip (M25.551)  Primary osteoarthritis of one knee (M17.10)   LEFT Status post total right knee replacement [id number]  Primary osteoarthritis of right hip (M16.11)  Heart murmur  High blood pressure  Hypercholesterolemia  Osteoarthritis  Measles  Mumps  Allergies Biaxin *MACROLIDES*  GI upset RifAMPin *Antimycobacterial Agents**  GI upset  Family History Cerebrovascular Accident  mother Heart Disease  First Degree Relatives. father  Social History  Drug/Alcohol Rehab (Currently)  no Exercise  Exercises weekly; does other Illicit drug use  no Number of flights of stairs before winded  2-3 Marital status  married Tobacco use  Former smoker. former smoker; smoke(d) less than 1/2 pack(s) per day Alcohol use  current drinker; drinks wine; only occasionally per week Children  4 Current work status  retired Pain Contract  no Previously in rehab  no Living situation  live with spouse [place]  Medication History Advil (prn) Active. Losartan Potassium-HCTZ (100-12.5MG  Tablet, Oral) Active. Simvastatin (10MG  Tablet, Oral) Active. Latanoprost (0.005% Solution, Ophthalmic) Active. Vitamin D (Oral) Specific strength unknown - Active. Biotin (Oral) Specific strength unknown - Active. CoQ10 (Oral) Specific strength unknown - Active. Aspirin (325MG  Tablet, Oral two times daily) Active.  Past Surgical History  Arthroscopy of Knee  right Tonsillectomy  Date: 26. Gallbladder Surgery  Date: 26. open Hysterectomy  Date: 2005. complete (non-cancerous) Total Knee Replacement - Right  Date: 2015.   Review of Systems General Not Present- Chills, Fatigue, Fever, Memory Loss, Night Sweats, Weight Gain and Weight Loss. Skin Not Present- Eczema, Hives, Itching, Lesions and Rash. HEENT Not Present- Dentures, Double Vision, Headache, Hearing Loss, Tinnitus and Visual Loss. Respiratory Not Present- Allergies, Chronic Cough, Coughing up blood,  Shortness  of breath at rest and Shortness of breath with exertion. Cardiovascular Not Present- Chest Pain, Difficulty Breathing Lying Down, Murmur, Palpitations, Racing/skipping heartbeats and Swelling. Gastrointestinal Present- Heartburn. Not Present- Abdominal Pain, Bloody Stool, Constipation, Diarrhea, Difficulty Swallowing, Jaundice, Loss of appetitie, Nausea and Vomiting. Female Genitourinary Not Present- Blood in Urine, Discharge, Flank Pain, Incontinence, Painful Urination, Urgency, Urinary frequency, Urinary Retention, Urinating at Night and Weak urinary stream. Musculoskeletal Present- Joint Pain and Morning Stiffness. Not Present- Back Pain, Joint Swelling, Muscle Pain, Muscle Weakness and Spasms. Neurological Not Present- Blackout spells, Difficulty with balance, Dizziness, Paralysis, Tremor and Weakness. Psychiatric Not Present- Insomnia.  Vitals Weight: 150 lb Height: 61.5in Weight was reported by patient. Height was reported by patient. Body Surface Area: 1.68 m Body Mass Index: 27.88 kg/m  Pulse: 56 (Regular)  BP: 168/82 (Sitting, Left Arm, Standard)   Physical Exam General Mental Status -Alert, cooperative and good historian. General Appearance-pleasant, Not in acute distress. Orientation-Oriented X3. Build & Nutrition-Well nourished and Well developed.  Head and Neck Head-normocephalic, atraumatic . Neck Global Assessment - bruit auscultated on the right(faint, likely referred murmur from chest), bruit auscultated on the left(faint, likely referred murmur from chest) and supple. Note: upper and lower dentures   Eye Vision-Wears corrective lenses. Pupil - Bilateral-Regular and Round. Motion - Bilateral-EOMI.  Chest and Lung Exam Auscultation Breath sounds - clear at anterior chest wall and clear at posterior chest wall. Adventitious sounds - No Adventitious sounds.  Cardiovascular Auscultation Rhythm - Regular rate and rhythm.  Heart Sounds - S1 WNL and S2 WNL. Murmurs & Other Heart Sounds: Murmur 1 - Location - Aortic Area, Pulmonic Area and Sternal Border - Left. Timing - Early systolic. Grade - III/VI. Character - Medium pitched. Radiation - Carotids(likely referred murmur from chest).  Abdomen Palpation/Percussion Tenderness - Abdomen is non-tender to palpation. Rigidity (guarding) - Abdomen is soft. Auscultation Auscultation of the abdomen reveals - Bowel sounds normal.  Female Genitourinary Note: Not done, not pertinent to present illness   Musculoskeletal Note: She is alert and oriented, in no apparent distress. Her right hip shows flexion about 100. There is no internal rotation, about 20 external rotation, 20 abduction.  IMAGING Her radiographs from last year and she at that point had bone on bone arthritis of the right hip with subchondral cystic formation. I looked at the films from her intraarticular injection and it appears that the arthritis has progressed even further.  Assessment & Plan Primary osteoarthritis of right hip (M16.11) Primary osteoarthritis of left knee (M17.12) Status post total right knee replacement [id number])  Note:Surgical Plans: Right Total Hip Replacement - Anterior Approach  Disposition: Home  PCP: Dr. Ann Held - pending  Topical TXA - possible TIA in 2016  Anesthesia Issues: None  Signed electronically by Alexzandrew Monika Salk, III PA-C

## 2015-12-31 ENCOUNTER — Ambulatory Visit: Payer: Self-pay | Admitting: Orthopedic Surgery

## 2015-12-31 NOTE — Progress Notes (Signed)
Preoperative surgical orders have been place into the Epic hospital system for [place] on [id number], 9:34 AM  by Mickel Crow for surgery on 01-18-16.  Preop Total Hip - Anterior Approach orders including IV Tylenol, and IV Decadron as long as there are no contraindications to the above medications. Arlee Muslim, PA-C

## 2016-01-08 ENCOUNTER — Encounter (HOSPITAL_COMMUNITY)
Admission: RE | Admit: 2016-01-08 | Discharge: 2016-01-08 | Disposition: A | Discharge status: Home / Self Care | Payer: Medicare Other | Source: Ambulatory Visit | Attending: Orthopedic Surgery | Admitting: Orthopedic Surgery

## 2016-01-08 ENCOUNTER — Other Ambulatory Visit: Payer: Self-pay

## 2016-01-08 ENCOUNTER — Encounter (HOSPITAL_COMMUNITY): Payer: Self-pay

## 2016-01-08 DIAGNOSIS — E785 Hyperlipidemia, unspecified: Secondary | ICD-10-CM | POA: Diagnosis not present

## 2016-01-08 DIAGNOSIS — Z79899 Other long term (current) drug therapy: Secondary | ICD-10-CM | POA: Diagnosis not present

## 2016-01-08 DIAGNOSIS — Z87891 Personal history of nicotine dependence: Secondary | ICD-10-CM | POA: Diagnosis not present

## 2016-01-08 DIAGNOSIS — M1611 Unilateral primary osteoarthritis, right hip: Secondary | ICD-10-CM | POA: Insufficient documentation

## 2016-01-08 DIAGNOSIS — Z01818 Encounter for other preprocedural examination: Secondary | ICD-10-CM | POA: Insufficient documentation

## 2016-01-08 DIAGNOSIS — Z7982 Long term (current) use of aspirin: Secondary | ICD-10-CM | POA: Insufficient documentation

## 2016-01-08 DIAGNOSIS — I350 Nonrheumatic aortic (valve) stenosis: Secondary | ICD-10-CM | POA: Diagnosis not present

## 2016-01-08 DIAGNOSIS — Z01812 Encounter for preprocedural laboratory examination: Secondary | ICD-10-CM | POA: Insufficient documentation

## 2016-01-08 DIAGNOSIS — I10 Essential (primary) hypertension: Secondary | ICD-10-CM | POA: Diagnosis not present

## 2016-01-08 DIAGNOSIS — Z96651 Presence of right artificial knee joint: Secondary | ICD-10-CM | POA: Diagnosis not present

## 2016-01-08 DIAGNOSIS — Z0183 Encounter for blood typing: Secondary | ICD-10-CM | POA: Insufficient documentation

## 2016-01-08 DIAGNOSIS — Z8673 Personal history of transient ischemic attack (TIA), and cerebral infarction without residual deficits: Secondary | ICD-10-CM | POA: Insufficient documentation

## 2016-01-08 HISTORY — DX: Nonrheumatic aortic (valve) stenosis: I35.0

## 2016-01-08 HISTORY — DX: Malignant (primary) neoplasm, unspecified: C80.1

## 2016-01-08 HISTORY — DX: Cerebral infarction, unspecified: I63.9

## 2016-01-08 LAB — COMPREHENSIVE METABOLIC PANEL
ALBUMIN: 3.9 g/dL (ref 3.5–5.0)
ALT: 15 U/L (ref 14–54)
ANION GAP: 11 (ref 5–15)
AST: 23 U/L (ref 15–41)
Alkaline Phosphatase: 71 U/L (ref 38–126)
BUN: 17 mg/dL (ref 6–20)
CHLORIDE: 102 mmol/L (ref 101–111)
CO2: 26 mmol/L (ref 22–32)
Calcium: 10.1 mg/dL (ref 8.9–10.3)
Creatinine, Ser: 0.8 mg/dL (ref 0.44–1.00)
GFR calc Af Amer: >60 mL/min (ref >60)
GFR calc non Af Amer: >60 mL/min (ref >60)
GLUCOSE: 98 mg/dL (ref 65–99)
POTASSIUM: 4 mmol/L (ref 3.5–5.1)
SODIUM: 139 mmol/L (ref 135–145)
TOTAL PROTEIN: 7 g/dL (ref 6.5–8.1)
Total Bilirubin: 0.5 mg/dL (ref 0.3–1.2)

## 2016-01-08 LAB — TYPE AND SCREEN
ABO/RH(D): O NEG
Antibody Screen: NEGATIVE

## 2016-01-08 LAB — ABO/RH: ABO/RH(D): O NEG

## 2016-01-08 LAB — URINALYSIS, ROUTINE W REFLEX MICROSCOPIC
BILIRUBIN URINE: NEGATIVE
Glucose, UA: NEGATIVE mg/dL
HGB URINE DIPSTICK: NEGATIVE
KETONES UR: NEGATIVE mg/dL
Leukocytes, UA: NEGATIVE
Nitrite: NEGATIVE
Protein, ur: NEGATIVE mg/dL
SPECIFIC GRAVITY, URINE: 1.013 (ref 1.005–1.030)
pH: 7.5 (ref 5.0–8.0)

## 2016-01-08 LAB — PROTIME-INR
INR: 1.19 (ref 0.00–1.49)
Prothrombin Time: 15.3 s — ABNORMAL HIGH (ref 11.6–15.2)

## 2016-01-08 LAB — CBC
HCT: 42.5 % (ref 36.0–46.0)
Hemoglobin: 14.5 g/dL (ref 12.0–15.0)
MCH: 28.3 pg (ref 26.0–34.0)
MCHC: 34.1 g/dL (ref 30.0–36.0)
MCV: 83 fL (ref 78.0–100.0)
Platelets: 170 10*3/uL (ref 150–400)
RBC: 5.12 MIL/uL — ABNORMAL HIGH (ref 3.87–5.11)
RDW: 13.5 % (ref 11.5–15.5)
WBC: 7.9 10*3/uL (ref 4.0–10.5)

## 2016-01-08 LAB — SURGICAL PCR SCREEN
MRSA, PCR: NEGATIVE
Staphylococcus aureus: POSITIVE — AB

## 2016-01-08 LAB — APTT: aPTT: 33 s (ref 24–37)

## 2016-01-08 NOTE — Progress Notes (Signed)
REQUESTED ECHO, EKG, STRESS TEST DONE AT [facility].

## 2016-01-09 ENCOUNTER — Encounter (HOSPITAL_COMMUNITY): Payer: Self-pay

## 2016-01-09 ENCOUNTER — Encounter (HOSPITAL_COMMUNITY): Payer: Self-pay | Admitting: Emergency Medicine

## 2016-01-09 NOTE — Progress Notes (Signed)
Anesthesia Chart Review:  Pt is an 82 year old female scheduled for R total hip arthroplasty anterior approach on 01/18/2016 with Dr. Wynelle Link.  PMH includes:  HTN, hyperlipidemia, moderate aortic stenosis, TIA, post-op N/V. Former smoker. BMI 27. S/p R TKA 01/23/14.   Medications include: ASA, losartan, simvastatin, spironolactone.   EKG 01/08/16: NSR. Possible LAE. Anteroseptal infarct, age undetermined  Echo 12/28/15 Oval Linsey):  1. Moderate calcific aortic stenosis, and mild aortic regurgitation; possibly bicuspid valve.  2. Mild concentric LVH with normal systolic function. EF 65-70% 3. Mild mitral regurgitation with mild LA dilatation 4. Normal R heart size/function with mild TR, borderline/mild elevation of pulmonary artery pressure 5. Incidentally noted PVCs  Exercise treadmill stress test 11/09/13 Oval Linsey):  1. Poor exercise tolerance 2. Good workload achieved 3. EKG showed changes that are not diagnostic for ischemia.  4. No chest pain during the exercise  Reviewed case with Dr. Delma Post. Pt's AS appears to be worsening (was mild 01/2015, now is moderate). Pt will need cardiology clearance prior to surgery. Left voicemail for Velvet in Dr. Anne Fu office.   Willeen Cass, FNP-BC [facility]/Anesthesiology Phone: [phone] 01/09/2016 12:20 PM

## 2016-01-16 ENCOUNTER — Telehealth: Payer: Self-pay | Admitting: Cardiovascular Disease

## 2016-01-16 NOTE — Telephone Encounter (Signed)
Received records from [place] for appointment on 02/01/16 with Dr Oval Linsey.  Records given to [facility] (medical records) for Dr Blenda Mounts schedule  On 02/01/16. lp

## 2016-01-18 ENCOUNTER — Inpatient Hospital Stay (HOSPITAL_COMMUNITY): Admission: RE | Admit: 2016-01-18 | Payer: Medicare Other | Source: Ambulatory Visit | Admitting: Orthopedic Surgery

## 2016-01-18 ENCOUNTER — Encounter (HOSPITAL_COMMUNITY): Admission: RE | Payer: Self-pay | Source: Ambulatory Visit

## 2016-01-18 SURGERY — ARTHROPLASTY, HIP, TOTAL, ANTERIOR APPROACH
Anesthesia: Choice | Site: Hip | Laterality: Right

## 2016-02-01 ENCOUNTER — Ambulatory Visit (INDEPENDENT_AMBULATORY_CARE_PROVIDER_SITE_OTHER): Payer: Medicare Other | Admitting: Cardiovascular Disease

## 2016-02-01 ENCOUNTER — Encounter: Payer: Self-pay | Admitting: Cardiovascular Disease

## 2016-02-01 VITALS — BP 192/62 | HR 80 | Ht 62.0 in | Wt 148.4 lb

## 2016-02-01 DIAGNOSIS — I10 Essential (primary) hypertension: Secondary | ICD-10-CM | POA: Diagnosis not present

## 2016-02-01 DIAGNOSIS — Z01818 Encounter for other preprocedural examination: Secondary | ICD-10-CM | POA: Diagnosis not present

## 2016-02-01 DIAGNOSIS — M1990 Unspecified osteoarthritis, unspecified site: Secondary | ICD-10-CM

## 2016-02-01 DIAGNOSIS — I350 Nonrheumatic aortic (valve) stenosis: Secondary | ICD-10-CM | POA: Diagnosis not present

## 2016-02-01 DIAGNOSIS — E785 Hyperlipidemia, unspecified: Secondary | ICD-10-CM

## 2016-02-01 DIAGNOSIS — M1610 Unilateral primary osteoarthritis, unspecified hip: Secondary | ICD-10-CM | POA: Insufficient documentation

## 2016-02-01 HISTORY — DX: Essential (primary) hypertension: I10

## 2016-02-01 HISTORY — DX: Unilateral primary osteoarthritis, unspecified hip: M16.10

## 2016-02-01 HISTORY — DX: Hyperlipidemia, unspecified: E78.5

## 2016-02-01 HISTORY — DX: Nonrheumatic aortic (valve) stenosis: I35.0

## 2016-02-01 MED ORDER — AMLODIPINE BESYLATE 5 MG PO TABS: 5.0000 mg | ORAL_TABLET | Freq: Every day | ORAL | Status: DC

## 2016-02-01 NOTE — Progress Notes (Signed)
Cardiology Office Note   Date:  [id number]   ID:  [name], DOB [birth year], MRN [id number]  PCP:  Default, Provider, MD  Cardiologist:   Sharol Harness, MD   Chief Complaint  Patient presents with  . New Patient (Initial Visit)    pt states no chest pain no SOB no edema no light headedness or dizziness      History of Present Illness: Ashley Ruiz is a 82 y.o. female with hypertension, hyperlipidemia, moderate aortic stenosis, and TIA who presents for pre-surgical risk assessment. Ashley Ruiz presented for an anesthesia appointment prior to R hip arthroplasty.  It was noted that her aortic stenosis had progressed from mild to moderate.  She was referred to cardiology for clearance pre-operatively.  Ashley Ruiz has been feeling well.  Her only complaint is hip pain when walking.  She is unable to get exercise due to the pain.  She can walk up stairs slowly and walks with a cane at a slow pace.  She denies chest pain or shortness of breath with these activities.  She also denies lower extremity edema, orthopnea or PND.    Ashley Ruiz checks her blood pressure at home and it has been running slightly high, but not as high as in clinic today.  She denies headache or dizziness.  She did take her antihypertensive today.  Past Medical History  Diagnosis Date  . Hypertension   . Heart murmur   . PONV (postoperative nausea and vomiting)   . Arthritis     OA AND PAIN BOTH KNEES; SOME PAIN IN RT HIP  . Hypercholesteremia   . Stroke [facility])     ?   SMALL TIA , PATIENT HAD 1 EPISODE DOUBLE VISION 12/2014  . Cancer (HCC)     SKIN CANCER ON LT EAR  . Aortic stenosis     peak/mean pressure gradient 28/16 mmHg by echo at [facility] 12/28/15  . Moderate aortic stenosis 02/01/2016  . Essential hypertension 02/01/2016  . Hip arthritis 02/01/2016  . Hyperlipidemia 02/01/2016    Past Surgical History  Procedure Laterality Date  . Tonsillectomy  1943  . Cholecystectomy  ? 1982    . Abdominal hysterectomy  ? 2005  . Total knee arthroplasty Right 01/23/2014    Procedure: RIGHT TOTAL KNEE ARTHROPLASTY;  Surgeon: [name], MD;  Location: [facility] ORS;  Service: Orthopedics;  Laterality: Right;     Current Outpatient Prescriptions  Medication Sig Dispense Refill  . acetaminophen (TYLENOL) 500 MG tablet Take 1,000 mg by mouth every 6 (six) hours as needed.    Marland Kitchen aspirin 325 MG tablet Take 325 mg by mouth 2 (two) times daily.    . Biotin 1000 MCG tablet Take 1,000 mcg by mouth daily.    Marland Kitchen co-enzyme Q-10 30 MG capsule Take 30 mg by mouth daily.    . Ergocalciferol (VITAMIN D2) 2000 units TABS Take 1 tablet by mouth daily.    Marland Kitchen latanoprost (XALATAN) 0.005 % ophthalmic solution Place 1 drop into both eyes at bedtime.    Marland Kitchen losartan (COZAAR) 100 MG tablet Take 100 mg by mouth daily.    . simvastatin (ZOCOR) 10 MG tablet Take 10 mg by mouth at bedtime.     Marland Kitchen spironolactone (ALDACTONE) 25 MG tablet Take 25 mg by mouth every other day.    . traMADol (ULTRAM) 50 MG tablet Take 1-2 tablets (50-100 mg total) by mouth every 6 (six) hours as needed (mild to moderate  pain). 60 tablet 1  . amLODipine (NORVASC) 5 MG tablet Take 1 tablet (5 mg total) by mouth daily. 30 tablet 5   No current facility-administered medications for this visit.    Allergies:   Biaxin    Social History:  The patient  reports that she has quit smoking. Her smoking use included Cigarettes. She has a 2.5 pack-year smoking history. She has never used smokeless tobacco. She reports that she drinks alcohol. She reports that she does not use illicit drugs.   Family History:  The patient's family history includes Cancer in her brother; Heart attack in her father; Other in her mother.    ROS:  Please see the history of present illness.   Otherwise, review of systems are positive for none.   All other systems are reviewed and negative.    PHYSICAL EXAM: VS:  BP 192/62 mmHg  Pulse 80  Ht 5\' 2"  (1.575 m)  Wt  67.314 kg (148 lb 6.4 oz)  BMI 27.14 kg/m2 , BMI Body mass index is 27.14 kg/(m^2). GENERAL:  Well appearing HEENT:  Pupils equal round and reactive, fundi not visualized, oral mucosa unremarkable NECK:  No jugular venous distention, waveform within normal limits, carotid upstroke brisk and symmetric, no thyromegaly.  Radiation of AS murmur to bilateral carotids. LYMPHATICS:  No cervical adenopathy LUNGS:  Clear to auscultation bilaterally HEART:  RRR.  PMI not displaced or sustained,S1 and S2 within normal limits, no S3, no S4, no clicks, no rubs, III/VI mid-peaking systolic murmur at the LUSB. ABD:  Flat, positive bowel sounds normal in frequency in pitch, no bruits, no rebound, no guarding, no midline pulsatile mass, no hepatomegaly, no splenomegaly EXT:  2 plus pulses throughout, no edema, no cyanosis no clubbing SKIN:  No rashes no nodules NEURO:  Cranial nerves II through XII grossly intact, motor grossly intact throughout PSYCH:  Cognitively intact, oriented to person place and time   EKG:  EKG is  Not ordered today. Independent review of the EKG ordered 01/08/16 shows sinus rhythm rate 65 bpm and prior anteroseptal infarct.   Echo 12/28/15 Oval Linsey):  1. Moderate calcific aortic stenosis, and mild aortic regurgitation; possibly bicuspid valve.  2. Mild concentric LVH with normal systolic function. EF 65-70% 3. Mild mitral regurgitation with mild LA dilatation 4. Normal R heart size/function with mild TR, borderline/mild elevation of pulmonary artery pressure 5. Incidentally noted PVCs  Exercise treadmill stress test 11/09/13 Oval Linsey):  1. Poor exercise tolerance 2. Good workload achieved 3. EKG showed changes that are not diagnostic for ischemia.  4. No chest pain during the exercise   Recent Labs: 01/08/2016: ALT 15; BUN 17; Creatinine, Ser 0.80; Hemoglobin 14.5; Platelets 170; Potassium 4.0; Sodium 139    Lipid Panel No results found for: CHOL, TRIG, HDL, CHOLHDL,  VLDL, LDLCALC, LDLDIRECT    Wt Readings from Last 3 Encounters:  02/01/16 67.314 kg (148 lb 6.4 oz)  01/08/16 68.04 kg (150 lb)  01/23/14 67.132 kg (148 lb)      ASSESSMENT AND PLAN:  # Pre-operative risk assessment: Ms. Maxwell does not get much exercise due to hip pain.  I get the impression that her inability to go up stairs and walk is mostly attributable to her hip pain rather than CP or SOB.  However, we will obtain a Lexiscan Cardiolite to evaluate for ischemia given that she cannot attain >4 METs of physical activity.  Her moderate aortic stenosis does increase her operative risk slightly.   Given that patients with  severe, asymptomatic aortic stenosis have an increased operative mortality of 1-3%, it is reasonable to infer that with moderate aortic stenosis, as long as she does not have ischemia or heart failure her operative risk should still be fairly low.  It will be important to get better blood pressure control before she goes to the OR.  # Moderate aortic stenosis: Stable.  Repeat echo in 1 year.  # Hypertension: Ms. Bretado's blood pressure is poorly-controlled today.  It remained elevated on repeat assessment.  We will add amlodipine 5 mg daily and she will check her BP at home.  We will follow up in 2 weeks.      Current medicines are reviewed at length with the patient today.  The patient does not have concerns regarding medicines.  The following changes have been made:  no change  Labs/ tests ordered today include:   Orders Placed This Encounter  Procedures  . Myocardial Perfusion Imaging     Disposition:   FU with Tequan Redmon C. Oval Linsey, MD, [facility] in 2 weeks    This note was written with the assistance of speech recognition software.  Please excuse any transcriptional errors.  Signed, Elzada Pytel C. Oval Linsey, MD, [facility]  02/01/2016 1:23 PM    [place]

## 2016-02-01 NOTE — Patient Instructions (Signed)
Medication Instructions:  START NORVASC 5 MG DAILY   Labwork: NONE  Testing/Procedures: Your physician has requested that you have a lexiscan myoview. For further information please visit [email or web address]. Please follow instruction sheet, as given.  Follow-Up: Your physician recommends that you schedule a follow-up appointment in: 2 WEEKS   If you need a refill on your cardiac medications before your next appointment, please call your pharmacy.

## 2016-02-05 DIAGNOSIS — I10 Essential (primary) hypertension: Secondary | ICD-10-CM | POA: Diagnosis not present

## 2016-02-05 DIAGNOSIS — I2510 Atherosclerotic heart disease of native coronary artery without angina pectoris: Secondary | ICD-10-CM | POA: Diagnosis not present

## 2016-02-05 DIAGNOSIS — Z87891 Personal history of nicotine dependence: Secondary | ICD-10-CM | POA: Diagnosis not present

## 2016-02-05 DIAGNOSIS — S81802A Unspecified open wound, left lower leg, initial encounter: Secondary | ICD-10-CM | POA: Diagnosis not present

## 2016-02-05 DIAGNOSIS — M1990 Unspecified osteoarthritis, unspecified site: Secondary | ICD-10-CM | POA: Diagnosis not present

## 2016-02-05 DIAGNOSIS — M069 Rheumatoid arthritis, unspecified: Secondary | ICD-10-CM | POA: Diagnosis not present

## 2016-02-08 ENCOUNTER — Telehealth (HOSPITAL_COMMUNITY): Payer: Self-pay

## 2016-02-08 NOTE — Telephone Encounter (Signed)
Encounter complete. 

## 2016-02-12 ENCOUNTER — Telehealth (HOSPITAL_COMMUNITY): Payer: Self-pay

## 2016-02-12 DIAGNOSIS — S81812A Laceration without foreign body, left lower leg, initial encounter: Secondary | ICD-10-CM | POA: Diagnosis not present

## 2016-02-12 DIAGNOSIS — S81802D Unspecified open wound, left lower leg, subsequent encounter: Secondary | ICD-10-CM | POA: Diagnosis not present

## 2016-02-12 NOTE — Telephone Encounter (Signed)
Encounter complete. 

## 2016-02-13 ENCOUNTER — Ambulatory Visit (HOSPITAL_COMMUNITY)
Admission: RE | Admit: 2016-02-13 | Discharge: 2016-02-13 | Disposition: A | Discharge status: Home / Self Care | Payer: Medicare Other | Source: Ambulatory Visit | Attending: Cardiovascular Disease | Admitting: Cardiovascular Disease

## 2016-02-13 DIAGNOSIS — Z87891 Personal history of nicotine dependence: Secondary | ICD-10-CM | POA: Insufficient documentation

## 2016-02-13 DIAGNOSIS — I10 Essential (primary) hypertension: Secondary | ICD-10-CM | POA: Insufficient documentation

## 2016-02-13 DIAGNOSIS — I350 Nonrheumatic aortic (valve) stenosis: Secondary | ICD-10-CM | POA: Diagnosis not present

## 2016-02-13 DIAGNOSIS — Z01818 Encounter for other preprocedural examination: Secondary | ICD-10-CM | POA: Insufficient documentation

## 2016-02-13 DIAGNOSIS — Z8249 Family history of ischemic heart disease and other diseases of the circulatory system: Secondary | ICD-10-CM | POA: Diagnosis not present

## 2016-02-13 LAB — MYOCARDIAL PERFUSION IMAGING
CHL CUP NUCLEAR SDS: 1
CHL CUP NUCLEAR SRS: 0
CHL CUP RESTING HR STRESS: 69 {beats}/min
LV sys vol: 16 mL
LVDIAVOL: 60 mL (ref 46–106)
Peak HR: 107 {beats}/min
SSS: 1
TID: 0.95

## 2016-02-13 MED ORDER — TECHNETIUM TC 99M SESTAMIBI GENERIC - CARDIOLITE
31.6000 | Freq: Once | INTRAVENOUS | Status: AC | PRN
  Administered 2016-02-13: 30.9 via INTRAVENOUS

## 2016-02-13 MED ORDER — REGADENOSON 0.4 MG/5ML IV SOLN
0.4000 mg | Freq: Once | INTRAVENOUS | Status: AC
  Administered 2016-02-13: 0.4 mg via INTRAVENOUS

## 2016-02-13 MED ORDER — AMINOPHYLLINE 25 MG/ML IV SOLN
75.0000 mg | Freq: Once | INTRAVENOUS | Status: AC
  Administered 2016-02-13: 75 mg via INTRAVENOUS

## 2016-02-13 MED ORDER — TECHNETIUM TC 99M SESTAMIBI GENERIC - CARDIOLITE
10.2000 | Freq: Once | INTRAVENOUS | Status: AC | PRN
  Administered 2016-02-13: 9.7 via INTRAVENOUS

## 2016-02-14 ENCOUNTER — Telehealth: Payer: Self-pay | Admitting: *Deleted

## 2016-02-14 NOTE — Telephone Encounter (Signed)
Advised patient and will send to Dr Wynelle Link

## 2016-02-14 NOTE — Telephone Encounter (Signed)
-----   Message from Skeet Latch, MD sent at 02/13/2016  7:23 PM EDT ----- Normal stress test.  She is low risk for surgery.

## 2016-02-19 DIAGNOSIS — S81812A Laceration without foreign body, left lower leg, initial encounter: Secondary | ICD-10-CM | POA: Diagnosis not present

## 2016-02-19 DIAGNOSIS — Z09 Encounter for follow-up examination after completed treatment for conditions other than malignant neoplasm: Secondary | ICD-10-CM | POA: Diagnosis not present

## 2016-02-19 DIAGNOSIS — Z872 Personal history of diseases of the skin and subcutaneous tissue: Secondary | ICD-10-CM | POA: Diagnosis not present

## 2016-02-19 NOTE — Progress Notes (Signed)
Marland KitchenMarland KitchenMarland Kitchen.................................   Cardiology Office Note   Date:  [id number]   ID:  BRISTOL GAUTHREAUX, DOB [id number], MRN [id number]  PCP:  Ann Held, MD  Cardiologist:   Sharol Harness, MD   Chief Complaint  Patient presents with  . Follow-up    lexiscan myoview  pt states she is waiting on hip surgery, supposed to have in April--she has been tired, but no other Sx.     Patient ID: ANNESIA AILLS is a 82 y.o. female with hypertension, hyperlipidemia, moderate aortic stenosis, and TIA who presents for pre-surgical risk assessment.   Interval History 02/20/16: Ms. Cederquist underwent nuclear stress testing that was negative for ischemia.  Amlodipine was added to her regimen and she was asked to keep a log of her blood pressures.  She brings a log of her blood pressures that have ranged 121-142/50s-60s.   Ms. Dott is scheduled for hip surgery on April 19th.  She has been trying to walk for exercise  But she is limited by hip pain. She denies any chest pain or shortness of breath. She has not noted any palpitations, lightheadedness, or dizziness.  One year ago Ms. Ashby had a TIA (double vision for a few minutes that resolved).  Her PCP initially prescribed Plavix, but she decided not to take this anymore so she was switched to aspirin 325 mg twice a day.  She denies any history of bleeding but wonders if she needs to take that much.  Dr. Nicki Reaper checks her lipids and she reports that they were "excellent" recently.    History of Present Illness 02/01/16:Ms. Nored presented for an anesthesia appointment prior to R hip arthroplasty.  It was noted that her aortic stenosis had progressed from mild to moderate.  She was referred to cardiology for clearance pre-operatively.  Ms. Zeise has been feeling well.  Her only complaint is hip pain when walking.  She is unable to get exercise due to the pain.  She can walk up stairs slowly and walks with a cane at a slow pace.  She denies  chest pain or shortness of breath with these activities.  She also denies lower extremity edema, orthopnea or PND.    Ms. Mercil checks her blood pressure at home and it has been running slightly high, but not as high as in clinic today.  She denies headache or dizziness.  She did take her antihypertensive today.  Past Medical History  Diagnosis Date  . Hypertension   . Heart murmur   . PONV (postoperative nausea and vomiting)   . Arthritis     OA AND PAIN BOTH KNEES; SOME PAIN IN RT HIP  . Hypercholesteremia   . Stroke [facility])     ?   SMALL TIA , PATIENT HAD 1 EPISODE DOUBLE VISION 12/2014  . Cancer (HCC)     SKIN CANCER ON LT EAR  . Aortic stenosis     peak/mean pressure gradient 28/16 mmHg by echo at [facility] 12/28/15  . Moderate aortic stenosis 02/01/2016  . Essential hypertension 02/01/2016  . Hip arthritis 02/01/2016  . Hyperlipidemia 02/01/2016    Past Surgical History  Procedure Laterality Date  . Tonsillectomy  1943  . Cholecystectomy  ? 1982  . Abdominal hysterectomy  ? 2005  . Total knee arthroplasty Right 01/23/2014    Procedure: RIGHT TOTAL KNEE ARTHROPLASTY;  Surgeon: [name], MD;  Location: [facility] ORS;  Service: Orthopedics;  Laterality: Right;     Current Outpatient Prescriptions  Medication Sig Dispense Refill  . acetaminophen (TYLENOL) 500 MG tablet Take 1,000 mg by mouth every 6 (six) hours as needed.    Marland Kitchen amLODipine (NORVASC) 5 MG tablet Take 1 tablet (5 mg total) by mouth daily. 30 tablet 5  . aspirin 81 MG tablet Take 81 mg by mouth daily.    . Biotin 1000 MCG tablet Take 1,000 mcg by mouth daily.    Marland Kitchen co-enzyme Q-10 30 MG capsule Take 30 mg by mouth daily.    . Ergocalciferol (VITAMIN D2) 2000 units TABS Take 1 tablet by mouth daily.    Marland Kitchen latanoprost (XALATAN) 0.005 % ophthalmic solution Place 1 drop into both eyes at bedtime.    Marland Kitchen losartan (COZAAR) 100 MG tablet Take 100 mg by mouth daily.    . simvastatin (ZOCOR) 10 MG tablet Take 10 mg by mouth  at bedtime.     Marland Kitchen spironolactone (ALDACTONE) 25 MG tablet Take 25 mg by mouth every other day.     No current facility-administered medications for this visit.    Allergies:   Biaxin    Social History:  The patient  reports that she has quit smoking. Her smoking use included Cigarettes. She has a 2.5 pack-year smoking history. She has never used smokeless tobacco. She reports that she drinks alcohol. She reports that she does not use illicit drugs.   Family History:  The patient's family history includes Cancer in her brother; Heart attack in her father; Other in her mother.    ROS:  Please see the history of present illness.   Otherwise, review of systems are positive for none.   All other systems are reviewed and negative.    PHYSICAL EXAM: VS:  BP 155/76 mmHg  Pulse 76  Ht 5\' 2"  (1.575 m)  Wt 66.769 kg (147 lb 3.2 oz)  BMI 26.92 kg/m2 , BMI Body mass index is 26.92 kg/(m^2). GENERAL:  Well appearing HEENT:  Pupils equal round and reactive, fundi not visualized, oral mucosa unremarkable NECK:  No jugular venous distention, waveform within normal limits, carotid upstroke brisk and symmetric.  Radiation of AS murmur to bilateral carotids. LYMPHATICS:  No cervical adenopathy LUNGS:  Clear to auscultation bilaterally HEART:  RRR.  PMI not displaced or sustained,S1 and S2 within normal limits, no S3, no S4, no clicks, no rubs, III/VI mid-peaking systolic murmur at the LUSB. ABD:  Flat, positive bowel sounds normal in frequency in pitch, no bruits, no rebound, no guarding, no midline pulsatile mass, no hepatomegaly, no splenomegaly EXT:  2 plus pulses throughout, no edema, no cyanosis no clubbing SKIN:  No rashes no nodules NEURO:  Cranial nerves II through XII grossly intact, motor grossly intact throughout PSYCH:  Cognitively intact, oriented to person place and time   EKG:  EKG is  not ordered today.   Echo 12/28/15 Oval Linsey):  1. Moderate calcific aortic stenosis, and mild  aortic regurgitation; possibly bicuspid valve.  2. Mild concentric LVH with normal systolic function. EF 65-70% 3. Mild mitral regurgitation with mild LA dilatation 4. Normal R heart size/function with mild TR, borderline/mild elevation of pulmonary artery pressure 5. Incidentally noted PVCs  Exercise treadmill stress test 11/09/13 Oval Linsey):  1. Poor exercise tolerance 2. Good workload achieved 3. EKG showed changes that are not diagnostic for ischemia.  4. No chest pain during the exercise   Recent Labs: 01/08/2016: ALT 15; BUN 17; Creatinine, Ser 0.80; Hemoglobin 14.5; Platelets 170; Potassium 4.0; Sodium 139    Lipid Panel No results  found for: CHOL, TRIG, HDL, CHOLHDL, VLDL, LDLCALC, LDLDIRECT    Wt Readings from Last 3 Encounters:  02/20/16 66.769 kg (147 lb 3.2 oz)  02/13/16 67.132 kg (148 lb)  02/01/16 67.314 kg (148 lb 6.4 oz)      ASSESSMENT AND PLAN:  # Pre-operative risk assessment: Ms. Revolorio Had a negative stress test. Therefore, she is low risk for surgery. She can hold her aspirin preoperatively.  # Moderate aortic stenosis: Stable.  Repeat echo in 1 year.  # Hypertension: Ms. Straka's blood pressure is well-controlled after adding amlodipine.  Continue amlodipine and losartan.  # TIA: Ms. Amendt  had a TIA one year ago. She has been taking aspirin 325 mg twice a day. This is likely to increase her bleeding risk and has not been proven to be more effective at preventing stroke. Therefore we will reduce the dose to 81 mg. Continue simvastatin. We will check carotid ultrasound as she does not remember having this study completed at the time of her TIA   Current medicines are reviewed at length with the patient today.  The patient does not have concerns regarding medicines.  The following changes have been made:  no change  Labs/ tests ordered today include:   No orders of the defined types were placed in this encounter.     Disposition:   FU with  Verlie Hellenbrand C. Oval Linsey, MD, [facility] in 1 year.  This note was written with the assistance of speech recognition software.  Please excuse any transcriptional errors.  Signed, Desiderio Dolata C. Oval Linsey, MD, [facility]  02/20/2016 8:33 AM    [facility]

## 2016-02-20 ENCOUNTER — Encounter: Payer: Self-pay | Admitting: Cardiovascular Disease

## 2016-02-20 ENCOUNTER — Telehealth: Payer: Self-pay | Admitting: *Deleted

## 2016-02-20 ENCOUNTER — Ambulatory Visit (INDEPENDENT_AMBULATORY_CARE_PROVIDER_SITE_OTHER): Payer: Medicare Other | Admitting: Cardiovascular Disease

## 2016-02-20 VITALS — BP 155/76 | HR 76 | Ht 62.0 in | Wt 147.2 lb

## 2016-02-20 DIAGNOSIS — I350 Nonrheumatic aortic (valve) stenosis: Secondary | ICD-10-CM

## 2016-02-20 DIAGNOSIS — I10 Essential (primary) hypertension: Secondary | ICD-10-CM

## 2016-02-20 DIAGNOSIS — Z8673 Personal history of transient ischemic attack (TIA), and cerebral infarction without residual deficits: Secondary | ICD-10-CM | POA: Diagnosis not present

## 2016-02-20 NOTE — Telephone Encounter (Signed)
Faxed surgical clearance to [facility] yesterday Spoke with [facility] surgical scheduler this am and confirmed received

## 2016-02-20 NOTE — Patient Instructions (Addendum)
Medication Instructions:  DECREASE YOUR ASPIRIN TO 81 MG DAILY   Labwork: none  Testing/Procedures: Your physician has requested that you have a carotid duplex. This test is an ultrasound of the carotid arteries in your neck. It looks at blood flow through these arteries that supply the brain with blood. Allow one hour for this exam. There are no restrictions or special instructions.  Follow-Up: Your physician wants you to follow-up in: 1 year ov  You will receive a reminder letter in the mail two months in advance. If you don't receive a letter, please call our office to schedule the follow-up appointment.  If you need a refill on your cardiac medications before your next appointment, please call your pharmacy.

## 2016-02-25 ENCOUNTER — Telehealth: Payer: Self-pay | Admitting: Cardiovascular Disease

## 2016-02-25 NOTE — Telephone Encounter (Signed)
New Message  Pt called states that her PCP with [facility] family physician states that the carotid is no longer needed. Pt req a call back to discuss.

## 2016-02-25 NOTE — Telephone Encounter (Signed)
Ashley Ruiz does not need carotid Dopplers for screening.  However, she reported a history of TIA symptoms last year and she could not remember having a carotid ultrasound.  If she had one since her symptoms last year, I agree, she does not need another ultrasound.  However, if she has not had one, we should keep the scheduled appointment and make sure she does not have significant blockages that caused her symptoms.

## 2016-02-25 NOTE — Telephone Encounter (Signed)
Spoke with patient and she stated she did have a carotid since TIA symptoms Requested from Dr Herbie Baltimore Scott's office Advised patient ok to cancel carotid

## 2016-02-25 NOTE — Telephone Encounter (Signed)
Returned call to patient.She stated her PCP Dr.Scott advised she no longer needs to have carotid dopplers.Stated she has carotid dopplers scheduled 02/28/16,but she wanted to ask Dr.Emmett what she thinks before she cancelled appointment.Message sent to [place] for advice.

## 2016-02-26 ENCOUNTER — Telehealth: Payer: Self-pay | Admitting: Cardiovascular Disease

## 2016-02-26 NOTE — Telephone Encounter (Signed)
Carotid Doppler from 01/30/15 reviewed.  There was no carotid stenosis on the right or left.   We will not repeat that study.

## 2016-02-27 ENCOUNTER — Encounter (HOSPITAL_COMMUNITY): Payer: Self-pay

## 2016-02-28 ENCOUNTER — Telehealth: Payer: Self-pay | Admitting: *Deleted

## 2016-02-28 ENCOUNTER — Ambulatory Visit: Payer: Self-pay | Admitting: Orthopedic Surgery

## 2016-02-28 ENCOUNTER — Inpatient Hospital Stay (HOSPITAL_COMMUNITY): Admission: RE | Admit: 2016-02-28 | Payer: Self-pay | Source: Ambulatory Visit

## 2016-02-28 DIAGNOSIS — Z471 Aftercare following joint replacement surgery: Secondary | ICD-10-CM | POA: Diagnosis not present

## 2016-02-28 DIAGNOSIS — M1712 Unilateral primary osteoarthritis, left knee: Secondary | ICD-10-CM | POA: Diagnosis not present

## 2016-02-28 DIAGNOSIS — M1611 Unilateral primary osteoarthritis, right hip: Secondary | ICD-10-CM | POA: Diagnosis not present

## 2016-02-28 DIAGNOSIS — Z96651 Presence of right artificial knee joint: Secondary | ICD-10-CM | POA: Diagnosis not present

## 2016-02-28 NOTE — Telephone Encounter (Signed)
Advised patient

## 2016-02-28 NOTE — Telephone Encounter (Signed)
-----   Message from Skeet Latch, MD sent at 02/26/2016  8:00 AM EDT ----- I reviewed her carotid Doppler from 01/2015. She does not need to repeat this on 4/6.  Thanks.

## 2016-02-28 NOTE — Progress Notes (Signed)
Preoperative surgical orders have been place into the Epic hospital system for Ashley Ruiz on 0000000, 3:01 PM  by Mickel Crow for surgery on 03-12-16.  Preop Total Hip - Anterior Approach orders including IV Tylenol, and IV Decadron as long as there are no contraindications to the above medications. Ashley Muslim, PA-C

## 2016-03-03 DIAGNOSIS — H40003 Preglaucoma, unspecified, bilateral: Secondary | ICD-10-CM | POA: Diagnosis not present

## 2016-03-04 ENCOUNTER — Ambulatory Visit: Payer: Self-pay | Admitting: Orthopedic Surgery

## 2016-03-04 NOTE — H&P (Signed)
Blessed Sanpedro DOB: [id number] Married / Language: English / Race: White Female Date of Admission:  03/12/16 CC:  Right hip pain and left knee pain History of Present Illness  The patient is a 82 year old female who comes in for a preoperative History and Physical. The patient is scheduled for a right total hip arthroplasty (anterior) and a left knee cortisone injection to be performed by Dr. Dione Plover. Aluisio, MD at [facility] on 03-12-16 . The patient is a 82 year old female who presented for follow up of their hip. The patient is being followed for their right hip pain and osteoarthritis. They are several months out from I-A injection with Dr. Nelva Bush. Symptoms reported include: pain (with weightbearing), pain with weightbearing and difficulty ambulating. The patient feels that they are doing poorly and report their pain level to be severe. The following medication has been used for pain control: Ultram and ibuprofen. She states that her right hip has gotten progressively worse. The injection helped for couple of weeks. She is at a stage now where the hips essentially taken all her life. She is ready to go ahead and get this replaced. We had previously discussed the hip replacement. She is now ready to proceed with surgery at this time. They have been treated conservatively in the past for the above stated problem and despite conservative measures, they continue to have progressive pain and severe functional limitations and dysfunction. They have failed non-operative management including home exercise, medications, and injections. It is felt that they would benefit from undergoing total joint replacement. Risks and benefits of the procedure have been discussed with the patient and they elect to proceed with surgery. There are no active contraindications to surgery such as ongoing infection or rapidly progressive neurological disease. She would also like to get a cortisone injection into the left  knee at the time of surgery.   Problem List/Past Medical Primary osteoarthritis of one knee (M17.10)  LEFT Primary osteoarthritis of right hip (M16.11)  Status post total right knee replacement (Z96.651)  Heart murmur  High blood pressure  Hypercholesterolemia  Osteoarthritis  Measles  Mumps  Allergies  Biaxin *MACROLIDES*  GI upset RifAMPin *Antimycobacterial Agents**  GI upset  Family History Cerebrovascular Accident  mother Heart Disease  First Degree Relatives. father  Social History Drug/Alcohol Rehab (Currently)  no Exercise  Exercises weekly; does other Illicit drug use  no Number of flights of stairs before winded  2-3 Marital status  married Tobacco use  Former smoker. former smoker; smoke(d) less than 1/2 pack(s) per day Alcohol use  current drinker; drinks wine; only occasionally per week Children  4 Current work status  retired Pain Contract  no Previously in rehab  no Living situation  live with spouse [place] following the [place] to be performed on 03/12/2016.  Medication History Acetaminophen (500MG  Tablet, Oral) Active. Norvasc (5MG  Tablet, Oral) Active. Aspirin (81MG  Tablet, Oral two times daily) Active. Biotin (Oral) Specific strength unknown - Active. CoQ10 (Oral) Specific strength unknown - Active. Vitamin D (Oral) Specific strength unknown - Active. Advil (prn) Active. Losartan Potassium-HCTZ (100-12.5MG  Tablet, Oral) Active. Simvastatin (10MG  Tablet, Oral) Active. Latanoprost (0.005% Solution, Ophthalmic) Active. Spironolactone (25MG  Tablet, Oral) Active.  Past Surgical History  Arthroscopy of Knee  right Tonsillectomy  Date: 75. Gallbladder Surgery  Date: 89. open Hysterectomy  Date: 2005. complete (non-cancerous) Total Knee Replacement - Right  Date: 2015.   Review of  Systems  General Not Present- Chills, Fatigue,  Fever, Memory Loss, Night Sweats, Weight Gain and Weight Loss. Skin Not Present- Eczema, Hives, Itching, Lesions and Rash. HEENT Not Present- Dentures, Double Vision, Headache, Hearing Loss, Tinnitus and Visual Loss. Respiratory Not Present- Allergies, Chronic Cough, Coughing up blood, Shortness of breath at rest and Shortness of breath with exertion. Cardiovascular Not Present- Chest Pain, Difficulty Breathing Lying Down, Murmur, Palpitations, Racing/skipping heartbeats and Swelling. Gastrointestinal Present- Heartburn. Not Present- Abdominal Pain, Bloody Stool, Constipation, Diarrhea, Difficulty Swallowing, Jaundice, Loss of appetitie, Nausea and Vomiting. Female Genitourinary Not Present- Blood in Urine, Discharge, Flank Pain, Incontinence, Painful Urination, Urgency, Urinary frequency, Urinary Retention, Urinating at Night and Weak urinary stream. Musculoskeletal Present- Joint Pain and Morning Stiffness. Not Present- Back Pain, Joint Swelling, Muscle Pain, Muscle Weakness and Spasms. Neurological Not Present- Blackout spells, Difficulty with balance, Dizziness, Paralysis, Tremor and Weakness. Psychiatric Not Present- Insomnia.  Vitals  Weight: 150 lb Height: 61.5in Body Surface Area: 1.68 m Body Mass Index: 27.88 kg/m  BP: 128/62 (Sitting, Right Arm, Standard)  Physical Exam General Mental Status -Alert, cooperative and good historian. General Appearance-pleasant, Not in acute distress. Orientation-Oriented X3. Build & Nutrition-Well nourished and Well developed.  Head and Neck Head-normocephalic, atraumatic . Neck Global Assessment - bruit auscultated on the right(faint, likely referred murmur from chest), bruit auscultated on the left(faint, likely referred murmur from chest) and supple. Note: upper and lower dentures   Eye Vision-Wears corrective lenses. Pupil - Bilateral-Regular and Round. Motion - Bilateral-EOMI.  Chest and Lung  Exam Auscultation Breath sounds - clear at anterior chest wall and clear at posterior chest wall. Adventitious sounds - No Adventitious sounds.  Cardiovascular Auscultation Rhythm - Regular rate and rhythm. Heart Sounds - S1 WNL and S2 WNL. Murmurs & Other Heart Sounds: Murmur 1 - Location - Aortic Area, Pulmonic Area and Sternal Border - Left. Timing - Early systolic. Grade - III/VI. Character - Medium pitched. Radiation - Carotids(likely referred murmur from chest).  Abdomen Palpation/Percussion Tenderness - Abdomen is non-tender to palpation. Rigidity (guarding) - Abdomen is soft. Auscultation Auscultation of the abdomen reveals - Bowel sounds normal.  Female Genitourinary Note: Not done, not pertinent to present illness   Musculoskeletal Note: She is alert and oriented, in no apparent distress. Her right hip shows flexion about 100. There is no internal rotation, about 20 external rotation, 20 abduction. Her left knee shows slight varus deformity. Full extension, further flexion to 120 degrees. Mild crepitation throughout range of motion. No pain.  IMAGING Her radiographs from last year and she at that point had bone on bone arthritis of the right hip with subchondral cystic formation. I looked at the films from her intraarticular injection and it appears that the arthritis has progressed even further.  Assessment & Plan  Status post total right knee replacement [id number]) Primary osteoarthritis of left knee (M17.12) Primary osteoarthritis of right hip (M16.11)  Note:Surgical Plans: Right Total Hip Replacement - Anterior Approach and a Left Knee Cortisone Injection  Disposition: Home  PCP: Dr. Ann Held Cards: Dr. Oval Linsey - Patient has been seen preoperatively and felt to be stable for surgery. Patient has recently underwent a cardiac stress test.  Topical TXA - possible TIA in 2016  Anesthesia Issues: None  Signed electronically by Ok Edwards,  III PA-C

## 2016-03-05 ENCOUNTER — Encounter (HOSPITAL_COMMUNITY): Payer: Self-pay

## 2016-03-05 ENCOUNTER — Encounter (HOSPITAL_COMMUNITY)
Admission: RE | Admit: 2016-03-05 | Discharge: 2016-03-05 | Disposition: A | Discharge status: Home / Self Care | Payer: Medicare Other | Source: Ambulatory Visit | Attending: Orthopedic Surgery | Admitting: Orthopedic Surgery

## 2016-03-05 DIAGNOSIS — M1611 Unilateral primary osteoarthritis, right hip: Secondary | ICD-10-CM | POA: Diagnosis not present

## 2016-03-05 DIAGNOSIS — M1612 Unilateral primary osteoarthritis, left hip: Secondary | ICD-10-CM | POA: Insufficient documentation

## 2016-03-05 DIAGNOSIS — Z01812 Encounter for preprocedural laboratory examination: Secondary | ICD-10-CM | POA: Diagnosis not present

## 2016-03-05 LAB — SURGICAL PCR SCREEN
MRSA, PCR: NEGATIVE
STAPHYLOCOCCUS AUREUS: POSITIVE — AB

## 2016-03-05 LAB — URINALYSIS, ROUTINE W REFLEX MICROSCOPIC
BILIRUBIN URINE: NEGATIVE
Glucose, UA: NEGATIVE mg/dL
HGB URINE DIPSTICK: NEGATIVE
KETONES UR: NEGATIVE mg/dL
Leukocytes, UA: NEGATIVE
NITRITE: NEGATIVE
PH: 7 (ref 5.0–8.0)
Protein, ur: NEGATIVE mg/dL
Specific Gravity, Urine: 1.011 (ref 1.005–1.030)

## 2016-03-05 LAB — COMPREHENSIVE METABOLIC PANEL
ALK PHOS: 70 U/L (ref 38–126)
ALT: 15 U/L (ref 14–54)
AST: 24 U/L (ref 15–41)
Albumin: 4.4 g/dL (ref 3.5–5.0)
Anion gap: 10 (ref 5–15)
BILIRUBIN TOTAL: 0.2 mg/dL — AB (ref 0.3–1.2)
BUN: 23 mg/dL — AB (ref 6–20)
CALCIUM: 10.3 mg/dL (ref 8.9–10.3)
CO2: 28 mmol/L (ref 22–32)
CREATININE: 0.75 mg/dL (ref 0.44–1.00)
Chloride: 104 mmol/L (ref 101–111)
GFR calc Af Amer: >60 mL/min (ref >60)
Glucose, Bld: 102 mg/dL — ABNORMAL HIGH (ref 65–99)
Potassium: 5.3 mmol/L — ABNORMAL HIGH (ref 3.5–5.1)
Sodium: 142 mmol/L (ref 135–145)
TOTAL PROTEIN: 7.5 g/dL (ref 6.5–8.1)

## 2016-03-05 LAB — CBC
HEMATOCRIT: 42.9 % (ref 36.0–46.0)
Hemoglobin: 14.6 g/dL (ref 12.0–15.0)
MCH: 28.2 pg (ref 26.0–34.0)
MCHC: 34 g/dL (ref 30.0–36.0)
MCV: 83 fL (ref 78.0–100.0)
Platelets: 193 10*3/uL (ref 150–400)
RBC: 5.17 MIL/uL — AB (ref 3.87–5.11)
RDW: 13.2 % (ref 11.5–15.5)
WBC: 7.1 10*3/uL (ref 4.0–10.5)

## 2016-03-05 LAB — PROTIME-INR
INR: 1.07 (ref 0.00–1.49)
PROTHROMBIN TIME: 14.1 s (ref 11.6–15.2)

## 2016-03-05 LAB — APTT: aPTT: 36 seconds (ref 24–37)

## 2016-03-05 NOTE — Patient Instructions (Signed)
Ashley Ruiz  [id number]   Your procedure is scheduled on: Wednesday 03/12/2016  Report to [facility] Main  Entrance take [place]  elevators to 3rd floor to  [place] at  [place]  AM.  Call this number if you have problems the morning of surgery [phone]   Remember: ONLY 1 PERSON MAY GO WITH YOU TO SHORT STAY TO GET  READY MORNING OF [name]   Do not eat food or drink liquids :After Midnight.     Take these medicines the morning of surgery with A SIP OF WATER: Amlodipine, use eye drops                                You may not have any metal on your body including hair pins and              piercings  Do not wear jewelry, make-up, lotions, powders or perfumes, deodorant             Do not wear nail polish.  Do not shave  48 hours prior to surgery.              Men may shave face and neck.   Do not bring valuables to the hospital. [place].  Contacts, dentures or bridgework may not be worn into surgery.  Leave suitcase in the car. After surgery it may be brought to your room.                   Please read over the following fact sheets you were given: _____________________________________________________________________             [facility] - Preparing for Surgery Before surgery, you can play an important role.  Because skin is not sterile, your skin needs to be as free of germs as possible.  You can reduce the number of germs on your skin by washing with CHG (chlorahexidine gluconate) soap before surgery.  CHG is an antiseptic cleaner which kills germs and bonds with the skin to continue killing germs even after washing. Please DO NOT use if you have an allergy to CHG or antibacterial soaps.  If your skin becomes reddened/irritated stop using the CHG and inform your nurse when you arrive at Short Stay. Do not shave (including legs and underarms) for at least 48 hours prior to the first  CHG shower.  You may shave your face/neck. Please follow these instructions carefully:  1.  Shower with CHG Soap the night before surgery and the  morning of Surgery.  2.  If you choose to wash your hair, wash your hair first as usual with your  normal  shampoo.  3.  After you shampoo, rinse your hair and body thoroughly to remove the  shampoo.                           4.  Use CHG as you would any other liquid soap.  You can apply chg directly  to the skin and wash                       Gently with a scrungie or clean  washcloth.  5.  Apply the CHG Soap to your body ONLY FROM THE NECK DOWN.   Do not use on face/ open                           Wound or open sores. Avoid contact with eyes, ears mouth and genitals (private parts).                       Wash face,  Genitals (private parts) with your normal soap.             6.  Wash thoroughly, paying special attention to the area where your surgery  will be performed.  7.  Thoroughly rinse your body with warm water from the neck down.  8.  DO NOT shower/wash with your normal soap after using and rinsing off  the CHG Soap.                9.  Pat yourself dry with a clean towel.            10.  Wear clean pajamas.            11.  Place clean sheets on your bed the night of your first shower and do not  sleep with pets. Day of Surgery : Do not apply any lotions/deodorants the morning of surgery.  Please wear clean clothes to the hospital/surgery center.  FAILURE TO FOLLOW THESE INSTRUCTIONS MAY RESULT IN THE CANCELLATION OF YOUR SURGERY PATIENT SIGNATURE_________________________________  NURSE SIGNATURE__________________________________  ________________________________________________________________________   Ashley Ruiz  An incentive spirometer is a tool that can help keep your lungs clear and active. This tool measures how well you are filling your lungs with each breath. Taking long deep breaths may help reverse or decrease the  chance of developing breathing (pulmonary) problems (especially infection) following:  A long period of time when you are unable to move or be active. BEFORE THE PROCEDURE   If the spirometer includes an indicator to show your best effort, your nurse or respiratory therapist will set it to a desired goal.  If possible, sit up straight or lean slightly forward. Try not to slouch.  Hold the incentive spirometer in an upright position. INSTRUCTIONS FOR USE   Sit on the edge of your bed if possible, or sit up as far as you can in bed or on a chair.  Hold the incentive spirometer in an upright position.  Breathe out normally.  Place the mouthpiece in your mouth and seal your lips tightly around it.  Breathe in slowly and as deeply as possible, raising the piston or the ball toward the top of the column.  Hold your breath for 3-5 seconds or for as long as possible. Allow the piston or ball to fall to the bottom of the column.  Remove the mouthpiece from your mouth and breathe out normally.  Rest for a few seconds and repeat Steps 1 through 7 at least 10 times every 1-2 hours when you are awake. Take your time and take a few normal breaths between deep breaths.  The spirometer may include an indicator to show your best effort. Use the indicator as a goal to work toward during each repetition.  After each set of 10 deep breaths, practice coughing to be sure your lungs are clear. If you have an incision (the cut made at the time of surgery), support your incision when coughing by  placing a pillow or rolled up towels firmly against it. Once you are able to get out of bed, walk around indoors and cough well. You may stop using the incentive spirometer when instructed by your caregiver.  RISKS AND COMPLICATIONS  Take your time so you do not get dizzy or light-headed.  If you are in pain, you may need to take or ask for pain medication before doing incentive spirometry. It is harder to take a  deep breath if you are having pain. AFTER USE  Rest and breathe slowly and easily.  It can be helpful to keep track of a log of your progress. Your caregiver can provide you with a simple table to help with this. If you are using the spirometer at home, follow these instructions: [place] IF:   You are having difficultly using the spirometer.  You have trouble using the spirometer as often as instructed.  Your pain medication is not giving enough relief while using the spirometer.  You develop fever of 100.5 F (38.1 C) or higher. SEEK IMMEDIATE MEDICAL CARE IF:   You cough up bloody sputum that had not been present before.  You develop fever of 102 F (38.9 C) or greater.  You develop worsening pain at or near the incision site. MAKE SURE YOU:   Understand these instructions.  Will watch your condition.  Will get help right away if you are not doing well or get worse. Document Released: 03/23/2007 Document Revised: 02/02/2012 Document Reviewed: 05/24/2007 ExitCare Patient Information 2014 ExitCare, [place].   ________________________________________________________________________  WHAT IS A BLOOD TRANSFUSION? Blood Transfusion Information  A transfusion is the replacement of blood or some of its parts. Blood is made up of multiple cells which provide different functions.  Red blood cells carry oxygen and are used for blood loss replacement.  White blood cells fight against infection.  Platelets control bleeding.  Plasma helps clot blood.  Other blood products are available for specialized needs, such as hemophilia or other clotting disorders. BEFORE THE TRANSFUSION  Who gives blood for transfusions?   Healthy volunteers who are fully evaluated to make sure their blood is safe. This is blood bank blood. Transfusion therapy is the safest it has ever been in the practice of medicine. Before blood is taken from a donor, a complete history is taken to make  sure that person has no history of diseases nor engages in risky social behavior (examples are intravenous drug use or sexual activity with multiple partners). The donor's travel history is screened to minimize risk of transmitting infections, such as malaria. The donated blood is tested for signs of infectious diseases, such as HIV and hepatitis. The blood is then tested to be sure it is compatible with you in order to minimize the chance of a transfusion reaction. If you or a relative donates blood, this is often done in anticipation of surgery and is not appropriate for emergency situations. It takes many days to process the donated blood. RISKS AND COMPLICATIONS Although transfusion therapy is very safe and saves many lives, the main dangers of transfusion include:   Getting an infectious disease.  Developing a transfusion reaction. This is an allergic reaction to something in the blood you were given. Every precaution is taken to prevent this. The decision to have a blood transfusion has been considered carefully by your caregiver before blood is given. Blood is not given unless the benefits outweigh the risks. AFTER THE TRANSFUSION  Right after receiving a blood  transfusion, you will usually feel much better and more energetic. This is especially true if your red blood cells have gotten low (anemic). The transfusion raises the level of the red blood cells which carry oxygen, and this usually causes an energy increase.  The nurse administering the transfusion will monitor you carefully for complications. HOME CARE INSTRUCTIONS  No special instructions are needed after a transfusion. You may find your energy is better. Speak with your caregiver about any limitations on activity for underlying diseases you may have. SEEK MEDICAL CARE IF:   Your condition is not improving after your transfusion.  You develop redness or irritation at the intravenous (IV) site. SEEK IMMEDIATE MEDICAL CARE IF:   Any of the following symptoms occur over the next 12 hours:  Shaking chills.  You have a temperature by mouth above 102 F (38.9 C), not controlled by medicine.  Chest, back, or muscle pain.  People around you feel you are not acting correctly or are confused.  Shortness of breath or difficulty breathing.  Dizziness and fainting.  You get a rash or develop hives.  You have a decrease in urine output.  Your urine turns a dark color or changes to pink, red, or brown. Any of the following symptoms occur over the next 10 days:  You have a temperature by mouth above 102 F (38.9 C), not controlled by medicine.  Shortness of breath.  Weakness after normal activity.  The white part of the eye turns yellow (jaundice).  You have a decrease in the amount of urine or are urinating less often.  Your urine turns a dark color or changes to pink, red, or brown. Document Released: 11/07/2000 Document Revised: 02/02/2012 Document Reviewed: 06/26/2008 [facility] Patient Information 2014 [place], [place].  _______________________________________________________________________

## 2016-03-05 NOTE — Progress Notes (Addendum)
Faxed lab results to Dr. Wynelle Link to review-CMET and PCR MRSA screen

## 2016-03-05 NOTE — Progress Notes (Signed)
10/10/2015-Pre-op clearance from Dr. Ann Held on chart. 01/08/2016-noted in EPIC-EKG 02/16/2016-Pre-op clearance with note from Dr. Skeet Latch on chart.

## 2016-03-11 NOTE — Anesthesia Preprocedure Evaluation (Addendum)
Anesthesia Evaluation  Patient identified by MRN, date of birth, ID band Patient awake    Reviewed: Allergy & Precautions, H&P , NPO status , Patient's Chart, lab work & pertinent test results  History of Anesthesia Complications (+) PONV  Airway Mallampati: II  TM Distance: >3 FB Neck ROM: Full    Dental  (+) Dental Advisory Given, Edentulous Upper, Edentulous Lower   Pulmonary neg pulmonary ROS, former smoker,    Pulmonary exam normal breath sounds clear to auscultation       Cardiovascular hypertension, Pt. on medications Normal cardiovascular exam+ Valvular Problems/Murmurs AS  Rhythm:Regular Rate:Normal  Moderate AS   Neuro/Psych TIAnegative neurological ROS  negative psych ROS   GI/Hepatic negative GI ROS, Neg liver ROS,   Endo/Other  negative endocrine ROS  Renal/GU negative Renal ROS  negative genitourinary   Musculoskeletal negative musculoskeletal ROS (+)   Abdominal   Peds negative pediatric ROS (+)  Hematology negative hematology ROS (+)   Anesthesia Other Findings   Reproductive/Obstetrics negative OB ROS                            Anesthesia Physical Anesthesia Plan  ASA: III  Anesthesia Plan:    Post-op Pain Management:    Induction:   Airway Management Planned:   Additional Equipment:   Intra-op Plan:   Post-operative Plan:   Informed Consent:   Plan Discussed with:   Anesthesia Plan Comments:         Anesthesia Quick Evaluation

## 2016-03-12 ENCOUNTER — Encounter (HOSPITAL_COMMUNITY)
Admission: RE | Disposition: A | Discharge status: Home Health | Payer: Self-pay | Source: Ambulatory Visit | Attending: Orthopedic Surgery

## 2016-03-12 ENCOUNTER — Inpatient Hospital Stay (HOSPITAL_COMMUNITY): Payer: Medicare Other

## 2016-03-12 ENCOUNTER — Inpatient Hospital Stay (HOSPITAL_COMMUNITY): Payer: Medicare Other | Admitting: Anesthesiology

## 2016-03-12 ENCOUNTER — Encounter (HOSPITAL_COMMUNITY): Payer: Self-pay

## 2016-03-12 ENCOUNTER — Inpatient Hospital Stay (HOSPITAL_COMMUNITY)
Admission: RE | Admit: 2016-03-12 | Discharge: 2016-03-13 | DRG: 470 | Disposition: A | Discharge status: Home Health | Payer: Medicare Other | Source: Ambulatory Visit | Attending: Orthopedic Surgery | Admitting: Orthopedic Surgery

## 2016-03-12 DIAGNOSIS — Z87891 Personal history of nicotine dependence: Secondary | ICD-10-CM

## 2016-03-12 DIAGNOSIS — Z7982 Long term (current) use of aspirin: Secondary | ICD-10-CM

## 2016-03-12 DIAGNOSIS — Z96641 Presence of right artificial hip joint: Secondary | ICD-10-CM | POA: Diagnosis not present

## 2016-03-12 DIAGNOSIS — I10 Essential (primary) hypertension: Secondary | ICD-10-CM | POA: Diagnosis present

## 2016-03-12 DIAGNOSIS — Z96651 Presence of right artificial knee joint: Secondary | ICD-10-CM | POA: Diagnosis present

## 2016-03-12 DIAGNOSIS — M1611 Unilateral primary osteoarthritis, right hip: Secondary | ICD-10-CM | POA: Diagnosis present

## 2016-03-12 DIAGNOSIS — I350 Nonrheumatic aortic (valve) stenosis: Secondary | ICD-10-CM | POA: Diagnosis present

## 2016-03-12 DIAGNOSIS — M169 Osteoarthritis of hip, unspecified: Secondary | ICD-10-CM

## 2016-03-12 DIAGNOSIS — Z96649 Presence of unspecified artificial hip joint: Secondary | ICD-10-CM

## 2016-03-12 DIAGNOSIS — E7800 Pure hypercholesterolemia, unspecified: Secondary | ICD-10-CM | POA: Diagnosis present

## 2016-03-12 DIAGNOSIS — Z471 Aftercare following joint replacement surgery: Secondary | ICD-10-CM | POA: Diagnosis not present

## 2016-03-12 DIAGNOSIS — M25551 Pain in right hip: Secondary | ICD-10-CM | POA: Diagnosis not present

## 2016-03-12 DIAGNOSIS — M1712 Unilateral primary osteoarthritis, left knee: Secondary | ICD-10-CM | POA: Diagnosis present

## 2016-03-12 DIAGNOSIS — Z79899 Other long term (current) drug therapy: Secondary | ICD-10-CM

## 2016-03-12 DIAGNOSIS — E785 Hyperlipidemia, unspecified: Secondary | ICD-10-CM | POA: Diagnosis present

## 2016-03-12 HISTORY — DX: Osteoarthritis of hip, unspecified: M16.9

## 2016-03-12 HISTORY — PX: TOTAL HIP ARTHROPLASTY: SHX124

## 2016-03-12 LAB — TYPE AND SCREEN
ABO/RH(D): O NEG
Antibody Screen: NEGATIVE

## 2016-03-12 SURGERY — ARTHROPLASTY, HIP, TOTAL, ANTERIOR APPROACH
Anesthesia: General | Site: Hip | Laterality: Right

## 2016-03-12 MED ORDER — CEFAZOLIN SODIUM-DEXTROSE 2-4 GM/100ML-% IV SOLN
2.0000 g | Freq: Four times a day (QID) | INTRAVENOUS | Status: AC
  Administered 2016-03-12 (×2): 2 g via INTRAVENOUS
  Filled 2016-03-12 (×2): qty 100

## 2016-03-12 MED ORDER — ACETAMINOPHEN 500 MG PO TABS
1000.0000 mg | ORAL_TABLET | Freq: Four times a day (QID) | ORAL | Status: AC
  Administered 2016-03-12 – 2016-03-13 (×4): 1000 mg via ORAL
  Filled 2016-03-12 (×6): qty 2

## 2016-03-12 MED ORDER — ROCURONIUM BROMIDE 100 MG/10ML IV SOLN
INTRAVENOUS | Status: DC | PRN
  Administered 2016-03-12: 40 mg via INTRAVENOUS

## 2016-03-12 MED ORDER — CHLORHEXIDINE GLUCONATE 4 % EX LIQD: 60.0000 mL | Freq: Once | CUTANEOUS | Status: DC

## 2016-03-12 MED ORDER — SUGAMMADEX SODIUM 200 MG/2ML IV SOLN
INTRAVENOUS | Status: DC | PRN
  Administered 2016-03-12: 130 mg via INTRAVENOUS

## 2016-03-12 MED ORDER — LACTATED RINGERS IV SOLN: INTRAVENOUS | Status: DC

## 2016-03-12 MED ORDER — LIDOCAINE HCL (CARDIAC) 20 MG/ML IV SOLN
INTRAVENOUS | Status: AC
  Filled 2016-03-12: qty 5

## 2016-03-12 MED ORDER — RIVAROXABAN 10 MG PO TABS
10.0000 mg | ORAL_TABLET | Freq: Every day | ORAL | Status: DC
  Administered 2016-03-13: 10 mg via ORAL
  Filled 2016-03-12 (×2): qty 1

## 2016-03-12 MED ORDER — AMLODIPINE BESYLATE 5 MG PO TABS
5.0000 mg | ORAL_TABLET | Freq: Every day | ORAL | Status: DC
  Administered 2016-03-13: 5 mg via ORAL
  Filled 2016-03-12: qty 1

## 2016-03-12 MED ORDER — DEXAMETHASONE SODIUM PHOSPHATE 10 MG/ML IJ SOLN
INTRAMUSCULAR | Status: DC | PRN
  Administered 2016-03-12: 5 mg via INTRAVENOUS

## 2016-03-12 MED ORDER — ONDANSETRON HCL 4 MG PO TABS: 4.0000 mg | ORAL_TABLET | Freq: Four times a day (QID) | ORAL | Status: DC | PRN

## 2016-03-12 MED ORDER — METHOCARBAMOL 500 MG PO TABS
500.0000 mg | ORAL_TABLET | Freq: Four times a day (QID) | ORAL | Status: DC | PRN
  Administered 2016-03-13: 500 mg via ORAL
  Filled 2016-03-12: qty 1

## 2016-03-12 MED ORDER — LOSARTAN POTASSIUM 50 MG PO TABS
100.0000 mg | ORAL_TABLET | Freq: Every morning | ORAL | Status: DC
Start: 2016-03-13 — End: 2016-03-13
  Administered 2016-03-13: 100 mg via ORAL
  Filled 2016-03-12: qty 2

## 2016-03-12 MED ORDER — SPIRONOLACTONE 25 MG PO TABS
25.0000 mg | ORAL_TABLET | ORAL | Status: DC
  Administered 2016-03-13: 25 mg via ORAL
  Filled 2016-03-12: qty 1

## 2016-03-12 MED ORDER — SODIUM CHLORIDE 0.9 % IJ SOLN
INTRAMUSCULAR | Status: AC
  Filled 2016-03-12: qty 10

## 2016-03-12 MED ORDER — OXYCODONE HCL 5 MG PO TABS
5.0000 mg | ORAL_TABLET | ORAL | Status: DC | PRN
  Filled 2016-03-12: qty 1

## 2016-03-12 MED ORDER — POLYETHYLENE GLYCOL 3350 17 G PO PACK: 17.0000 g | PACK | Freq: Every day | ORAL | Status: DC | PRN

## 2016-03-12 MED ORDER — EPHEDRINE SULFATE 50 MG/ML IJ SOLN
INTRAMUSCULAR | Status: DC | PRN
Start: 2016-03-12 — End: 2016-03-12
  Administered 2016-03-12: 5 mg via INTRAVENOUS

## 2016-03-12 MED ORDER — METHYLPREDNISOLONE ACETATE 40 MG/ML IJ SUSP
INTRAMUSCULAR | Status: DC | PRN
  Administered 2016-03-12: 80 mg

## 2016-03-12 MED ORDER — ATROPINE SULFATE 0.4 MG/ML IJ SOLN
INTRAMUSCULAR | Status: AC
  Filled 2016-03-12: qty 1

## 2016-03-12 MED ORDER — ONDANSETRON HCL 4 MG/2ML IJ SOLN
INTRAMUSCULAR | Status: DC | PRN
  Administered 2016-03-12: 4 mg via INTRAVENOUS

## 2016-03-12 MED ORDER — DIPHENHYDRAMINE HCL 12.5 MG/5ML PO ELIX: 12.5000 mg | ORAL_SOLUTION | ORAL | Status: DC | PRN

## 2016-03-12 MED ORDER — BUPIVACAINE LIPOSOME 1.3 % IJ SUSP
20.0000 mL | Freq: Once | INTRAMUSCULAR | Status: DC
  Filled 2016-03-12: qty 20

## 2016-03-12 MED ORDER — METHOCARBAMOL 1000 MG/10ML IJ SOLN
500.0000 mg | Freq: Four times a day (QID) | INTRAVENOUS | Status: DC | PRN
  Administered 2016-03-12: 500 mg via INTRAVENOUS
  Filled 2016-03-12 (×2): qty 5

## 2016-03-12 MED ORDER — ACETAMINOPHEN 325 MG PO TABS: 650.0000 mg | ORAL_TABLET | Freq: Four times a day (QID) | ORAL | Status: DC | PRN

## 2016-03-12 MED ORDER — MENTHOL 3 MG MT LOZG: 1.0000 | LOZENGE | OROMUCOSAL | Status: DC | PRN

## 2016-03-12 MED ORDER — MORPHINE SULFATE (PF) 2 MG/ML IV SOLN: 1.0000 mg | INTRAVENOUS | Status: DC | PRN

## 2016-03-12 MED ORDER — DEXAMETHASONE SODIUM PHOSPHATE 10 MG/ML IJ SOLN
10.0000 mg | Freq: Once | INTRAMUSCULAR | Status: AC
  Administered 2016-03-13: 10 mg via INTRAVENOUS
  Filled 2016-03-12: qty 1

## 2016-03-12 MED ORDER — ACETAMINOPHEN 10 MG/ML IV SOLN
1000.0000 mg | Freq: Once | INTRAVENOUS | Status: AC
  Administered 2016-03-12: 1000 mg via INTRAVENOUS

## 2016-03-12 MED ORDER — HYDROMORPHONE HCL 1 MG/ML IJ SOLN
INTRAMUSCULAR | Status: DC | PRN
  Administered 2016-03-12 (×3): .4 mg via INTRAVENOUS

## 2016-03-12 MED ORDER — ONDANSETRON HCL 4 MG/2ML IJ SOLN
INTRAMUSCULAR | Status: AC
  Filled 2016-03-12: qty 2

## 2016-03-12 MED ORDER — BISACODYL 10 MG RE SUPP: 10.0000 mg | Freq: Every day | RECTAL | Status: DC | PRN

## 2016-03-12 MED ORDER — LACTATED RINGERS IV SOLN
INTRAVENOUS | Status: DC | PRN
  Administered 2016-03-12 (×2): via INTRAVENOUS

## 2016-03-12 MED ORDER — HYDROMORPHONE HCL 2 MG/ML IJ SOLN
INTRAMUSCULAR | Status: AC
  Filled 2016-03-12: qty 1

## 2016-03-12 MED ORDER — METOCLOPRAMIDE HCL 10 MG PO TABS: 5.0000 mg | ORAL_TABLET | Freq: Three times a day (TID) | ORAL | Status: DC | PRN

## 2016-03-12 MED ORDER — ONDANSETRON HCL 4 MG/2ML IJ SOLN: 4.0000 mg | Freq: Four times a day (QID) | INTRAMUSCULAR | Status: DC | PRN

## 2016-03-12 MED ORDER — DOCUSATE SODIUM 100 MG PO CAPS
100.0000 mg | ORAL_CAPSULE | Freq: Two times a day (BID) | ORAL | Status: DC
  Administered 2016-03-12 – 2016-03-13 (×3): 100 mg via ORAL

## 2016-03-12 MED ORDER — FENTANYL CITRATE (PF) 100 MCG/2ML IJ SOLN
INTRAMUSCULAR | Status: AC
  Filled 2016-03-12: qty 2

## 2016-03-12 MED ORDER — SODIUM CHLORIDE 0.9 % IV SOLN
INTRAVENOUS | Status: DC
  Administered 2016-03-12 – 2016-03-13 (×2): via INTRAVENOUS

## 2016-03-12 MED ORDER — SUGAMMADEX SODIUM 200 MG/2ML IV SOLN
INTRAVENOUS | Status: AC
  Filled 2016-03-12: qty 2

## 2016-03-12 MED ORDER — BUPIVACAINE HCL (PF) 0.25 % IJ SOLN
INTRAMUSCULAR | Status: AC
  Filled 2016-03-12: qty 30

## 2016-03-12 MED ORDER — SODIUM CHLORIDE 0.9 % IV SOLN: INTRAVENOUS | Status: DC

## 2016-03-12 MED ORDER — EPHEDRINE SULFATE 50 MG/ML IJ SOLN
INTRAMUSCULAR | Status: AC
  Filled 2016-03-12: qty 1

## 2016-03-12 MED ORDER — TRANEXAMIC ACID 1000 MG/10ML IV SOLN
2000.0000 mg | Freq: Once | INTRAVENOUS | Status: DC
  Filled 2016-03-12: qty 20

## 2016-03-12 MED ORDER — ACETAMINOPHEN 10 MG/ML IV SOLN
INTRAVENOUS | Status: AC
  Filled 2016-03-12: qty 100

## 2016-03-12 MED ORDER — LATANOPROST 0.005 % OP SOLN
1.0000 [drp] | Freq: Every day | OPHTHALMIC | Status: DC
  Administered 2016-03-12: 1 [drp] via OPHTHALMIC
  Filled 2016-03-12: qty 2.5

## 2016-03-12 MED ORDER — DEXAMETHASONE SODIUM PHOSPHATE 10 MG/ML IJ SOLN
INTRAMUSCULAR | Status: AC
  Filled 2016-03-12: qty 1

## 2016-03-12 MED ORDER — DEXAMETHASONE SODIUM PHOSPHATE 10 MG/ML IJ SOLN: 10.0000 mg | Freq: Once | INTRAMUSCULAR | Status: DC

## 2016-03-12 MED ORDER — LIDOCAINE HCL (CARDIAC) 20 MG/ML IV SOLN
INTRAVENOUS | Status: DC | PRN
  Administered 2016-03-12: 50 mg via INTRAVENOUS

## 2016-03-12 MED ORDER — MIDAZOLAM HCL 2 MG/2ML IJ SOLN
INTRAMUSCULAR | Status: AC
  Filled 2016-03-12: qty 2

## 2016-03-12 MED ORDER — SIMVASTATIN 10 MG PO TABS
10.0000 mg | ORAL_TABLET | Freq: Every day | ORAL | Status: DC
  Administered 2016-03-12: 10 mg via ORAL
  Filled 2016-03-12 (×2): qty 1

## 2016-03-12 MED ORDER — PROPOFOL 10 MG/ML IV BOLUS
INTRAVENOUS | Status: DC | PRN
  Administered 2016-03-12: 80 mg via INTRAVENOUS

## 2016-03-12 MED ORDER — TRANEXAMIC ACID 1000 MG/10ML IV SOLN
2000.0000 mg | INTRAVENOUS | Status: DC | PRN
  Administered 2016-03-12: 2000 mg via TOPICAL

## 2016-03-12 MED ORDER — FENTANYL CITRATE (PF) 100 MCG/2ML IJ SOLN
INTRAMUSCULAR | Status: DC | PRN
  Administered 2016-03-12: 100 ug via INTRAVENOUS

## 2016-03-12 MED ORDER — HYDROMORPHONE HCL 1 MG/ML IJ SOLN: 0.2500 mg | INTRAMUSCULAR | Status: DC | PRN

## 2016-03-12 MED ORDER — FLEET ENEMA 7-19 GM/118ML RE ENEM: 1.0000 | ENEMA | Freq: Once | RECTAL | Status: DC | PRN

## 2016-03-12 MED ORDER — CEFAZOLIN SODIUM-DEXTROSE 2-4 GM/100ML-% IV SOLN
2.0000 g | INTRAVENOUS | Status: AC
  Administered 2016-03-12: 2 g via INTRAVENOUS
  Filled 2016-03-12: qty 100

## 2016-03-12 MED ORDER — METHYLPREDNISOLONE ACETATE 40 MG/ML IJ SUSP
INTRAMUSCULAR | Status: AC
  Filled 2016-03-12: qty 2

## 2016-03-12 MED ORDER — PHENOL 1.4 % MT LIQD: 1.0000 | OROMUCOSAL | Status: DC | PRN

## 2016-03-12 MED ORDER — ACETAMINOPHEN 650 MG RE SUPP: 650.0000 mg | Freq: Four times a day (QID) | RECTAL | Status: DC | PRN

## 2016-03-12 MED ORDER — TRANEXAMIC ACID 1000 MG/10ML IV SOLN: 2000.0000 mg | Freq: Once | INTRAVENOUS | Status: DC

## 2016-03-12 MED ORDER — PROPOFOL 10 MG/ML IV BOLUS
INTRAVENOUS | Status: AC
  Filled 2016-03-12: qty 40

## 2016-03-12 MED ORDER — METOCLOPRAMIDE HCL 5 MG/ML IJ SOLN
5.0000 mg | Freq: Three times a day (TID) | INTRAMUSCULAR | Status: DC | PRN
Start: 2016-03-12 — End: 2016-03-13

## 2016-03-12 MED ORDER — BUPIVACAINE HCL (PF) 0.25 % IJ SOLN
INTRAMUSCULAR | Status: DC | PRN
  Administered 2016-03-12: 30 mL

## 2016-03-12 MED ORDER — CEFAZOLIN SODIUM-DEXTROSE 2-4 GM/100ML-% IV SOLN
INTRAVENOUS | Status: AC
  Filled 2016-03-12: qty 100

## 2016-03-12 MED ORDER — TRAMADOL HCL 50 MG PO TABS
50.0000 mg | ORAL_TABLET | Freq: Four times a day (QID) | ORAL | Status: DC | PRN
  Administered 2016-03-13: 50 mg via ORAL
  Filled 2016-03-12: qty 1

## 2016-03-12 SURGICAL SUPPLY — 35 items
BAG DECANTER FOR FLEXI CONT (MISCELLANEOUS) ×3 IMPLANT
BAG ZIPLOCK 12X15 (MISCELLANEOUS) IMPLANT
BLADE SAG 18X100X1.27 (BLADE) ×3 IMPLANT
BNDG COHESIVE 2X5 WHT NS (GAUZE/BANDAGES/DRESSINGS) ×3 IMPLANT
CAPT HIP TOTAL 2 ×3 IMPLANT
CLOSURE WOUND 1/2 X4 (GAUZE/BANDAGES/DRESSINGS) ×1
CLOTH BEACON ORANGE TIMEOUT ST (SAFETY) ×3 IMPLANT
COVER PERINEAL POST (MISCELLANEOUS) ×3 IMPLANT
DECANTER SPIKE VIAL GLASS SM (MISCELLANEOUS) ×3 IMPLANT
DRAPE STERI IOBAN 125X83 (DRAPES) ×3 IMPLANT
DRAPE U-SHAPE 47X51 STRL (DRAPES) ×6 IMPLANT
DRSG ADAPTIC 3X8 NADH LF (GAUZE/BANDAGES/DRESSINGS) ×3 IMPLANT
DRSG MEPILEX BORDER 4X4 (GAUZE/BANDAGES/DRESSINGS) ×3 IMPLANT
DRSG MEPILEX BORDER 4X8 (GAUZE/BANDAGES/DRESSINGS) ×3 IMPLANT
DURAPREP 26ML APPLICATOR (WOUND CARE) ×3 IMPLANT
ELECT REM PT RETURN 9FT ADLT (ELECTROSURGICAL) ×3
ELECTRODE REM PT RTRN 9FT ADLT (ELECTROSURGICAL) ×1 IMPLANT
EVACUATOR 1/8 PVC DRAIN (DRAIN) ×3 IMPLANT
GLOVE BIO SURGEON STRL SZ7.5 (GLOVE) ×3 IMPLANT
GLOVE BIO SURGEON STRL SZ8 (GLOVE) ×6 IMPLANT
GLOVE BIOGEL PI IND STRL 8 (GLOVE) ×2 IMPLANT
GLOVE BIOGEL PI INDICATOR 8 (GLOVE) ×4
GOWN STRL REUS W/TWL LRG LVL3 (GOWN DISPOSABLE) ×3 IMPLANT
GOWN STRL REUS W/TWL XL LVL3 (GOWN DISPOSABLE) ×3 IMPLANT
PACK ANTERIOR HIP CUSTOM (KITS) ×3 IMPLANT
STRIP CLOSURE SKIN 1/2X4 (GAUZE/BANDAGES/DRESSINGS) ×2 IMPLANT
SUT ETHIBOND NAB CT1 #1 30IN (SUTURE) ×3 IMPLANT
SUT MNCRL AB 4-0 PS2 18 (SUTURE) ×3 IMPLANT
SUT VIC AB 2-0 CT1 27 (SUTURE) ×4
SUT VIC AB 2-0 CT1 TAPERPNT 27 (SUTURE) ×2 IMPLANT
SUT VLOC 180 0 24IN GS25 (SUTURE) ×3 IMPLANT
SYR 50ML LL SCALE MARK (SYRINGE) IMPLANT
TRAY FOLEY W/METER SILVER 14FR (SET/KITS/TRAYS/PACK) ×3 IMPLANT
TRAY FOLEY W/METER SILVER 16FR (SET/KITS/TRAYS/PACK) IMPLANT
YANKAUER SUCT BULB TIP 10FT TU (MISCELLANEOUS) ×3 IMPLANT

## 2016-03-12 NOTE — H&P (View-Only) (Signed)
Ashley Ruiz DOB: [id number] Married / Language: English / Race: White Female Date of Admission:  03/12/16 CC:  Right hip pain and left knee pain History of Present Illness  The patient is a 82 year old female who comes in for a preoperative History and Physical. The patient is scheduled for a right total hip arthroplasty (anterior) and a left knee cortisone injection to be performed by Dr. Dione Plover. Aluisio, MD at [facility] on 03-12-16 . The patient is a 82 year old female who presented for follow up of their hip. The patient is being followed for their right hip pain and osteoarthritis. They are several months out from I-A injection with Dr. Nelva Bush. Symptoms reported include: pain (with weightbearing), pain with weightbearing and difficulty ambulating. The patient feels that they are doing poorly and report their pain level to be severe. The following medication has been used for pain control: Ultram and ibuprofen. She states that her right hip has gotten progressively worse. The injection helped for couple of weeks. She is at a stage now where the hips essentially taken all her life. She is ready to go ahead and get this replaced. We had previously discussed the hip replacement. She is now ready to proceed with surgery at this time. They have been treated conservatively in the past for the above stated problem and despite conservative measures, they continue to have progressive pain and severe functional limitations and dysfunction. They have failed non-operative management including home exercise, medications, and injections. It is felt that they would benefit from undergoing total joint replacement. Risks and benefits of the procedure have been discussed with the patient and they elect to proceed with surgery. There are no active contraindications to surgery such as ongoing infection or rapidly progressive neurological disease. She would also like to get a cortisone injection into the left  knee at the time of surgery.   Problem List/Past Medical Primary osteoarthritis of one knee (M17.10)  LEFT Primary osteoarthritis of right hip (M16.11)  Status post total right knee replacement (Z96.651)  Heart murmur  High blood pressure  Hypercholesterolemia  Osteoarthritis  Measles  Mumps  Allergies  Biaxin *MACROLIDES*  GI upset RifAMPin *Antimycobacterial Agents**  GI upset  Family History Cerebrovascular Accident  mother Heart Disease  First Degree Relatives. father  Social History Drug/Alcohol Rehab (Currently)  no Exercise  Exercises weekly; does other Illicit drug use  no Number of flights of stairs before winded  2-3 Marital status  married Tobacco use  Former smoker. former smoker; smoke(d) less than 1/2 pack(s) per day Alcohol use  current drinker; drinks wine; only occasionally per week Children  4 Current work status  retired Pain Contract  no Previously in rehab  no Living situation  live with spouse [employer] following the [place] to be performed on 03/12/2016.  Medication History Acetaminophen (500MG  Tablet, Oral) Active. Norvasc (5MG  Tablet, Oral) Active. Aspirin (81MG  Tablet, Oral two times daily) Active. Biotin (Oral) Specific strength unknown - Active. CoQ10 (Oral) Specific strength unknown - Active. Vitamin D (Oral) Specific strength unknown - Active. Advil (prn) Active. Losartan Potassium-HCTZ (100-12.5MG  Tablet, Oral) Active. Simvastatin (10MG  Tablet, Oral) Active. Latanoprost (0.005% Solution, Ophthalmic) Active. Spironolactone (25MG  Tablet, Oral) Active.  Past Surgical History  Arthroscopy of Knee  right Tonsillectomy  Date: 77. Gallbladder Surgery  Date: 24. open Hysterectomy  Date: 2005. complete (non-cancerous) Total Knee Replacement - Right  Date: 2015.   Review of  Systems  General Not Present- Chills, Fatigue,  Fever, Memory Loss, Night Sweats, Weight Gain and Weight Loss. Skin Not Present- Eczema, Hives, Itching, Lesions and Rash. HEENT Not Present- Dentures, Double Vision, Headache, Hearing Loss, Tinnitus and Visual Loss. Respiratory Not Present- Allergies, Chronic Cough, Coughing up blood, Shortness of breath at rest and Shortness of breath with exertion. Cardiovascular Not Present- Chest Pain, Difficulty Breathing Lying Down, Murmur, Palpitations, Racing/skipping heartbeats and Swelling. Gastrointestinal Present- Heartburn. Not Present- Abdominal Pain, Bloody Stool, Constipation, Diarrhea, Difficulty Swallowing, Jaundice, Loss of appetitie, Nausea and Vomiting. Female Genitourinary Not Present- Blood in Urine, Discharge, Flank Pain, Incontinence, Painful Urination, Urgency, Urinary frequency, Urinary Retention, Urinating at Night and Weak urinary stream. Musculoskeletal Present- Joint Pain and Morning Stiffness. Not Present- Back Pain, Joint Swelling, Muscle Pain, Muscle Weakness and Spasms. Neurological Not Present- Blackout spells, Difficulty with balance, Dizziness, Paralysis, Tremor and Weakness. Psychiatric Not Present- Insomnia.  Vitals  Weight: 150 lb Height: 61.5in Body Surface Area: 1.68 m Body Mass Index: 27.88 kg/m  BP: 128/62 (Sitting, Right Arm, Standard)  Physical Exam General Mental Status -Alert, cooperative and good historian. General Appearance-pleasant, Not in acute distress. Orientation-Oriented X3. Build & Nutrition-Well nourished and Well developed.  Head and Neck Head-normocephalic, atraumatic . Neck Global Assessment - bruit auscultated on the right(faint, likely referred murmur from chest), bruit auscultated on the left(faint, likely referred murmur from chest) and supple. Note: upper and lower dentures   Eye Vision-Wears corrective lenses. Pupil - Bilateral-Regular and Round. Motion - Bilateral-EOMI.  Chest and Lung  Exam Auscultation Breath sounds - clear at anterior chest wall and clear at posterior chest wall. Adventitious sounds - No Adventitious sounds.  Cardiovascular Auscultation Rhythm - Regular rate and rhythm. Heart Sounds - S1 WNL and S2 WNL. Murmurs & Other Heart Sounds: Murmur 1 - Location - Aortic Area, Pulmonic Area and Sternal Border - Left. Timing - Early systolic. Grade - III/VI. Character - Medium pitched. Radiation - Carotids(likely referred murmur from chest).  Abdomen Palpation/Percussion Tenderness - Abdomen is non-tender to palpation. Rigidity (guarding) - Abdomen is soft. Auscultation Auscultation of the abdomen reveals - Bowel sounds normal.  Female Genitourinary Note: Not done, not pertinent to present illness   Musculoskeletal Note: She is alert and oriented, in no apparent distress. Her right hip shows flexion about 100. There is no internal rotation, about 20 external rotation, 20 abduction. Her left knee shows slight varus deformity. Full extension, further flexion to 120 degrees. Mild crepitation throughout range of motion. No pain.  IMAGING Her radiographs from last year and she at that point had bone on bone arthritis of the right hip with subchondral cystic formation. I looked at the films from her intraarticular injection and it appears that the arthritis has progressed even further.  Assessment & Plan  Status post total right knee replacement [id number]) Primary osteoarthritis of left knee (M17.12) Primary osteoarthritis of right hip (M16.11)  Note:Surgical Plans: Right Total Hip Replacement - Anterior Approach and a Left Knee Cortisone Injection  Disposition: Home  PCP: Dr. Ann Held Cards: Dr. Oval Linsey - Patient has been seen preoperatively and felt to be stable for surgery. Patient has recently underwent a cardiac stress test.  Topical TXA - possible TIA in 2016  Anesthesia Issues: None  Signed electronically by Ok Edwards,  III PA-C

## 2016-03-12 NOTE — Evaluation (Signed)
Physical Therapy Evaluation Patient Details Name: Ashley Ruiz MRN: [id number] DOB: 1929/10/12 Today's Date: 03/12/2016   History of Present Illness  82 yo female s/p R THA 03/12/16.   Clinical Impression  On eval POD 0, pt required Min assist for mobility. She walked ~30 feet with RW. Pain rated 6/10 with activity. Anticipate pt will progress well during stay.     Follow Up Recommendations Home health PT    Equipment Recommendations  None recommended by PT    Recommendations for Other Services       Precautions / Restrictions Precautions Precautions: Fall Restrictions Weight Bearing Restrictions: No RLE Weight Bearing: Weight bearing as tolerated      Mobility  Bed Mobility Overal bed mobility: Needs Assistance Bed Mobility: Supine to Sit     Supine to sit: Min assist     General bed mobility comments: small amount of assist for R LE. Increased time.   Transfers Overall transfer level: Needs assistance Equipment used: Rolling walker (2 wheeled) Transfers: Sit to/from Stand Sit to Stand: Min assist         General transfer comment: VC safety, hand placement. Assist to stabilize.   Ambulation/Gait Ambulation/Gait assistance: Min assist Ambulation Distance (Feet): 30 Feet Assistive device: Rolling walker (2 wheeled) Gait Pattern/deviations: Step-to pattern;Antalgic     General Gait Details: small amount of assist to steady. VCs safety, sequence.   Stairs            Wheelchair Mobility    Modified Rankin (Stroke Patients Only)       Balance                                             Pertinent Vitals/Pain Pain Assessment: 0-10 Pain Score: 6  Pain Location: R hip/thigh with activity Pain Descriptors / Indicators: Sore Pain Intervention(s): Monitored during session;Repositioned    Home Living Family/patient expects to be discharged to:: Private residence Living Arrangements: Spouse/significant other Available Help  at Discharge: Family Type of Home: House Home Access: Stairs to enter Entrance Stairs-Rails: Right Entrance Stairs-Number of Steps: 3 Home Layout: One level Home Equipment: Environmental consultant - 2 wheels      Prior Function Level of Independence: Independent               Hand Dominance        Extremity/Trunk Assessment   Upper Extremity Assessment: Defer to OT evaluation           Lower Extremity Assessment: Generalized weakness      Cervical / Trunk Assessment: Normal  Communication   Communication: No difficulties  Cognition Arousal/Alertness: Awake/alert Behavior During Therapy: WFL for tasks assessed/performed Overall Cognitive Status: Within Functional Limits for tasks assessed                      General Comments      Exercises        Assessment/Plan    PT Assessment Patient needs continued PT services  PT Diagnosis Difficulty walking;Acute pain   PT Problem List Decreased strength;Decreased range of motion;Decreased activity tolerance;Decreased balance;Decreased mobility;Pain;Decreased knowledge of use of DME  PT Treatment Interventions DME instruction;Gait training;Functional mobility training;Stair training;Therapeutic activities;Patient/family education;Balance training;Therapeutic exercise   PT Goals (Current goals can be found in the Care Plan section) Acute Rehab PT Goals Patient Stated Goal: regain independence PT Goal Formulation: With patient Time  For Goal Achievement: 03/19/16 Potential to Achieve Goals: Good    Frequency 7X/week   Barriers to discharge        Co-evaluation               End of Session Equipment Utilized During Treatment: Gait belt Activity Tolerance: Patient tolerated treatment well Patient left: in chair;with call bell/phone within reach;with family/visitor present           Time: [id number] PT Time Calculation (min) (ACUTE ONLY): 19 min   Charges:   PT Evaluation $PT Eval Low Complexity: 1  Procedure     PT G Codes:        Weston Anna, MPT Pager: [phone]

## 2016-03-12 NOTE — Anesthesia Postprocedure Evaluation (Signed)
Anesthesia Post Note  Patient: Ashley Ruiz  Procedure(s) Performed: Procedure(s) (LRB): RIGHT TOTAL HIP ARTHROPLASTY ANTERIOR APPROACH WITH LEFT KNEE INJECTION (Right)  Patient location during evaluation: PACU Anesthesia Type: General Level of consciousness: awake and alert Pain management: pain level controlled Vital Signs Assessment: post-procedure vital signs reviewed and stable Respiratory status: spontaneous breathing, nonlabored ventilation, respiratory function stable and patient connected to nasal cannula oxygen Cardiovascular status: blood pressure returned to baseline and stable Postop Assessment: no signs of nausea or vomiting Anesthetic complications: no    Last Vitals:  Filed Vitals:   03/12/16 1053 03/12/16 1100  BP: 137/45 130/50  Pulse:  57  Temp: 36.6 C 36.5 C  Resp:  14    Last Pain:  Filed Vitals:   03/12/16 1133  PainSc: 0-No pain                 Marlie Kuennen L

## 2016-03-12 NOTE — Interval H&P Note (Signed)
History and Physical Interval Note:  [id number] AM  Ashley Ruiz  has presented today for surgery, with the diagnosis of RIGHT HIP OA   The various methods of treatment have been discussed with the patient and family. After consideration of risks, benefits and other options for treatment, the patient has consented to  Procedure(s): RIGHT TOTAL HIP ARTHROPLASTY ANTERIOR APPROACH (Right) as a surgical intervention .  The patient's history has been reviewed, patient examined, no change in status, stable for surgery.  I have reviewed the patient's chart and labs.  Questions were answered to the patient's satisfaction.     [name]

## 2016-03-12 NOTE — Op Note (Addendum)
OPERATIVE REPORT  PREOPERATIVE DIAGNOSIS: Osteoarthritis of the Right hip.    Osteoarthritis left knee  POSTOPERATIVE DIAGNOSIS: Osteoarthritis of the Right  hip.    Osteoarthritis left knee  PROCEDURE: Right total hip arthroplasty, anterior approach.    Left knee cortisone injection  SURGEON: Gaynelle Arabian, MD   ASSISTANT: Molli Barrows, PA-C  ANESTHESIA:  General  ESTIMATED BLOOD LOSS:-275 ml   DRAINS: Hemovac x1.   COMPLICATIONS: None   CONDITION: PACU - hemodynamically stable.   BRIEF CLINICAL NOTE: Ashley Ruiz is a 82 y.o. female who has advanced end-  stage arthritis of her Right  hip with progressively worsening pain and  dysfunction.The patient has failed nonoperative management and presents for  total hip arthroplasty. She also has symptomatic osteoarthritis of her left knee and requests cortisone injection.  PROCEDURE IN DETAIL: After successful administration of spinal  anesthetic, the traction boots for the [facility] bed were placed on both  feet and the patient was placed onto the [facility] bed, boots placed into the leg  holders. The Right hip was then isolated from the perineum with plastic  drapes and prepped and draped in the usual sterile fashion. ASIS and  greater trochanter were marked and a oblique incision was made, starting  at about 1 cm lateral and 2 cm distal to the ASIS and coursing towards  the anterior cortex of the femur. The skin was cut with a 10 blade  through subcutaneous tissue to the level of the fascia overlying the  tensor fascia lata muscle. The fascia was then incised in line with the  incision at the junction of the anterior third and posterior 2/3rd. The  muscle was teased off the fascia and then the interval between the TFL  and the rectus was developed. The Hohmann retractor was then placed at  the top of the femoral neck over the capsule. The vessels overlying the  capsule were cauterized and the fat on top of the capsule  was removed.  A Hohmann retractor was then placed anterior underneath the rectus  femoris to give exposure to the entire anterior capsule. A T-shaped  capsulotomy was performed. The edges were tagged and the femoral head  was identified.       Osteophytes are removed off the superior acetabulum.  The femoral neck was then cut in situ with an oscillating saw. Traction  was then applied to the left lower extremity utilizing the [facility]  traction. The femoral head was then removed. Retractors were placed  around the acetabulum and then circumferential removal of the labrum was  performed. Osteophytes were also removed. Reaming starts at 43 mm to  medialize and  Increased in 2 mm increments to 47 mm. We reamed in  approximately 40 degrees of abduction, 20 degrees anteversion. A 48 mm  pinnacle acetabular shell was then impacted in anatomic position under  fluoroscopic guidance with excellent purchase. We did not need to place  any additional dome screws. A 28 mm neutral + 4 marathon liner was then  placed into the acetabular shell.       The femoral lift was then placed along the lateral aspect of the femur  just distal to the vastus ridge. The leg was  externally rotated and capsule  was stripped off the inferior aspect of the femoral neck down to the  level of the lesser trochanter, this was done with electrocautery. The femur was lifted after this was performed. The  leg was then placed and extended in adducted position to essentially delivering the femur. We also removed the capsule superiorly and the  piriformis from the piriformis fossa to gain excellent exposure of the  proximal femur. Rongeur was used to remove some cancellous bone to get  into the lateral portion of the proximal femur for placement of the  initial starter reamer. The starter broaches was placed  the starter broach  and was shown to go down the center of the canal. Broaching  with the  Corail system was then performed  starting at size 8, coursing  Up to size 12. A size 12 had excellent torsional and rotational  and axial stability. The trial high offset neck was then placed  with a 28 + 1.5 trial head. The hip was then reduced. We confirmed that  the stem was in the canal both on AP and lateral x-rays. It also has excellent sizing. The hip was reduced with outstanding stability through full extension, full external rotation,  and then flexion in adduction internal rotation. AP pelvis was taken  and the leg lengths were measured and found to be exactly equal. Hip  was then dislocated again and the femoral head and neck removed. The  femoral broach was removed. Size 12 Corail stem with a high offset  neck was then impacted into the femur following native anteversion. Has  excellent purchase in the canal. Excellent torsional and rotational and  axial stability. It is confirmed to be in the canal on AP and lateral  fluoroscopic views. The 28 + 1.5 ceramic head was placed and the hip  reduced with outstanding stability. Again AP pelvis was taken and it  confirmed that the leg lengths were equal. The wound was then copiously  irrigated with saline solution and the capsule reattached and repaired  with Ethibond suture. 30 ml of .25% Bupivicaine injected into the capsule and into the edge of the tensor fascia lata as well as subcutaneous tissue. The fascia overlying the tensor fascia lata was  then closed with a running #1 V-Loc. Subcu was closed with interrupted  2-0 Vicryl and subcuticular running 4-0 Monocryl. Incision was cleaned  and dried. Steri-Strips and a bulky sterile dressing applied. Hemovac  drain was hooked to suction.       The left knee was then prepped with betadine and injected with 2 ml (80mg ) of Depomedrol without difficulties. The patient was awakened and transported to recovery in stable condition.        Please note that a surgical assistant was a medical necessity for this procedure to  perform it in a safe and expeditious manner. Assistant was necessary to provide appropriate retraction of vital neurovascular structures and to prevent femoral fracture and allow for anatomic placement of the prosthesis.  Gaynelle Arabian, M.D.

## 2016-03-12 NOTE — Transfer of Care (Signed)
Immediate Anesthesia Transfer of Care Note  Patient: Ashley Ruiz  Procedure(s) Performed: Procedure(s): RIGHT TOTAL HIP ARTHROPLASTY ANTERIOR APPROACH WITH LEFT KNEE INJECTION (Right)  Patient Location: PACU  Anesthesia Type:General  Level of Consciousness: awake and alert   Airway & Oxygen Therapy: Patient Spontanous Breathing and Patient connected to face mask oxygen  Post-op Assessment: Report given to RN and Post -op Vital signs reviewed and stable  Post vital signs: Reviewed and stable  Last Vitals:  Filed Vitals:   03/12/16 0630  BP: 158/68  Pulse: 68  Temp: 37 C  Resp: 16    Complications: No apparent anesthesia complications

## 2016-03-12 NOTE — Anesthesia Procedure Notes (Signed)
Procedure Name: Intubation Date/Time: 03/12/2016 8:23 AM Performed by: Danley Danker L Patient Re-evaluated:Patient Re-evaluated prior to inductionOxygen Delivery Method: Circle system utilized Preoxygenation: Pre-oxygenation with 100% oxygen Intubation Type: IV induction Ventilation: Mask ventilation without difficulty and Oral airway inserted - appropriate to patient size Laryngoscope Size: Sabra Heck and 2 Grade View: Grade I Tube type: Oral Tube size: 7.5 mm Number of attempts: 1 Airway Equipment and Method: Stylet Placement Confirmation: ETT inserted through vocal cords under direct vision,  breath sounds checked- equal and bilateral and positive ETCO2 Secured at: 20 cm Tube secured with: Tape Dental Injury: Teeth and Oropharynx as per pre-operative assessment

## 2016-03-13 LAB — BASIC METABOLIC PANEL
Anion gap: 8 (ref 5–15)
BUN: 12 mg/dL (ref 6–20)
CALCIUM: 8.8 mg/dL — AB (ref 8.9–10.3)
CO2: 24 mmol/L (ref 22–32)
Chloride: 108 mmol/L (ref 101–111)
Creatinine, Ser: 0.65 mg/dL (ref 0.44–1.00)
GFR calc Af Amer: >60 mL/min (ref >60)
GLUCOSE: 126 mg/dL — AB (ref 65–99)
POTASSIUM: 3.7 mmol/L (ref 3.5–5.1)
SODIUM: 140 mmol/L (ref 135–145)

## 2016-03-13 LAB — CBC
HCT: 31.7 % — ABNORMAL LOW (ref 36.0–46.0)
Hemoglobin: 10.8 g/dL — ABNORMAL LOW (ref 12.0–15.0)
MCH: 27.2 pg (ref 26.0–34.0)
MCHC: 34.1 g/dL (ref 30.0–36.0)
MCV: 79.8 fL (ref 78.0–100.0)
PLATELETS: 155 10*3/uL (ref 150–400)
RBC: 3.97 MIL/uL (ref 3.87–5.11)
RDW: 13.2 % (ref 11.5–15.5)
WBC: 13.2 10*3/uL — AB (ref 4.0–10.5)

## 2016-03-13 MED ORDER — RIVAROXABAN 10 MG PO TABS: 10.0000 mg | ORAL_TABLET | Freq: Every day | ORAL | Status: DC

## 2016-03-13 NOTE — Progress Notes (Signed)
   Subjective: 1 Day Post-Op Procedure(s) (LRB): RIGHT TOTAL HIP ARTHROPLASTY ANTERIOR APPROACH WITH LEFT KNEE INJECTION (Right) Patient reports pain as mild.   Patient seen in rounds with Dr. Wynelle Link. Patient is well, and has had no acute complaints or problems. Reports that she is doing well. No issues overnight. Plan is to go Home after hospital stay.  Objective: Vital signs in last 24 hours: Temp:  [97.7 F (36.5 C)-99.1 F (37.3 C)] 98.4 F (36.9 C) (04/20 0540) Pulse Rate:  [57-76] 76 (04/20 0540) Resp:  [12-18] 16 (04/20 0540) BP: (122-154)/(36-69) 138/53 mmHg (04/20 0540) SpO2:  [98 %-100 %] 100 % (04/20 0540) Weight:  [66.027 kg (145 lb 9 oz)] 66.027 kg (145 lb 9 oz) (04/19 0703)  Intake/Output from previous day:  Intake/Output Summary (Last 24 hours) at 03/13/16 0653 Last data filed at 03/13/16 0541  Gross per 24 hour  Intake 3333.75 ml  Output   2375 ml  Net 958.75 ml    Intake/Output this shift: Total I/O In: 1373.8 [P.O.:120; I.V.:1253.8] Out: 1500 [Urine:1500]  Labs:  Recent Labs  03/13/16 0422  HGB 10.8*    Recent Labs  03/13/16 0422  WBC 13.2*  RBC 3.97  HCT 31.7*  PLT 155    Recent Labs  03/13/16 0422  NA 140  K 3.7  CL 108  CO2 24  BUN 12  CREATININE 0.65  GLUCOSE 126*  CALCIUM 8.8*    EXAM General - Patient is Alert and Oriented Extremity - Neurologically intact Intact pulses distally Dorsiflexion/Plantar flexion intact No cellulitis present Compartment soft Dressing - dressing C/D/I Motor Function - intact, moving foot and toes well on exam.  Hemovac pulled without difficulty.  Past Medical History  Diagnosis Date  . Hypertension   . Heart murmur   . PONV (postoperative nausea and vomiting)   . Arthritis     OA AND PAIN BOTH KNEES; SOME PAIN IN RT HIP  . Hypercholesteremia   . Stroke [facility])     ?   SMALL TIA , PATIENT HAD 1 EPISODE DOUBLE VISION 12/2014  . Cancer (HCC)     SKIN CANCER ON LT EAR  . Aortic  stenosis     peak/mean pressure gradient 28/16 mmHg by echo at [facility] 12/28/15  . Moderate aortic stenosis 02/01/2016  . Essential hypertension 02/01/2016  . Hip arthritis 02/01/2016  . Hyperlipidemia 02/01/2016    Assessment/Plan: 1 Day Post-Op Procedure(s) (LRB): RIGHT TOTAL HIP ARTHROPLASTY ANTERIOR APPROACH WITH LEFT KNEE INJECTION (Right) Principal Problem:   OA (osteoarthritis) of hip  Estimated body mass index is 26.62 kg/(m^2) as calculated from the following:   Height as of this encounter: 5\' 2"  (1.575 m).   Weight as of this encounter: 66.027 kg (145 lb 9 oz). Advance diet Up with therapy D/C IV fluids Discharge home with home health  DVT Prophylaxis - Xarelto Weight Bearing As Tolerated  D/C Knee Immobilizer Hemovac Pulled Begin Therapy  Continue therapy today. Home this afternoon is progressing well.  Ardeen Jourdain, PA-C Orthopaedic Surgery 03/13/2016, 6:53 AM

## 2016-03-13 NOTE — Progress Notes (Signed)
Physical Therapy Treatment Patient Details Name: CHAISE TEWALT MRN: [id number] DOB: 1929/01/24 Today's Date: 03/13/2016    History of Present Illness 82 yo female s/p R THA 03/12/16.     PT Comments    Progressing well with mobility. Will have a 2nd session prior to d/c on today.   Follow Up Recommendations  Home health PT     Equipment Recommendations  None recommended by PT    Recommendations for Other Services       Precautions / Restrictions Precautions Precautions: Fall Restrictions Weight Bearing Restrictions: No RLE Weight Bearing: Weight bearing as tolerated    Mobility  Bed Mobility Overal bed mobility: Needs Assistance Bed Mobility: Supine to Sit     Supine to sit: Min assist     General bed mobility comments: small amount of assist for R LE. Increased time.   Transfers Overall transfer level: Needs assistance Equipment used: Rolling walker (2 wheeled) Transfers: Sit to/from Stand Sit to Stand: Min guard         General transfer comment: close guard for safety.  Ambulation/Gait Ambulation/Gait assistance: Min guard Ambulation Distance (Feet): 75 Feet Assistive device: Rolling walker (2 wheeled) Gait Pattern/deviations: Step-to pattern     General Gait Details: close guard for safety.    Stairs            Wheelchair Mobility    Modified Rankin (Stroke Patients Only)       Balance                                    Cognition Arousal/Alertness: Awake/alert Behavior During Therapy: WFL for tasks assessed/performed Overall Cognitive Status: Within Functional Limits for tasks assessed                      Exercises Total Joint Exercises Ankle Circles/Pumps: AROM;Both;10 reps;Supine Quad Sets: AROM;Both;10 reps;Supine Heel Slides: AAROM;Right;10 reps;Supine Hip ABduction/ADduction: AAROM;Right;10 reps;Supine    General Comments        Pertinent Vitals/Pain Pain Assessment: 0-10 Pain Score: 5   Pain Location: R thigh with activity Pain Descriptors / Indicators: Sore;Burning Pain Intervention(s): Monitored during session;Ice applied;Repositioned    Home Living                      Prior Function            PT Goals (current goals can now be found in the care plan section) Progress towards PT goals: Progressing toward goals    Frequency  7X/week    PT Plan Current plan remains appropriate    Co-evaluation             End of Session   Activity Tolerance: Patient tolerated treatment well Patient left: in chair;with call bell/phone within reach;with chair alarm set     Time: [phone] PT Time Calculation (min) (ACUTE ONLY): 17 min  Charges:  $Gait Training: 8-22 mins                    G Codes:      Weston Anna, MPT Pager: [phone]

## 2016-03-13 NOTE — Progress Notes (Signed)
Physical Therapy Treatment Patient Details Name: Ashley Ruiz MRN: [id number] DOB: [birth year] Today's Date: 03/13/2016    History of Present Illness 82 yo female s/p R THA 03/12/16.     PT Comments    Continuing to perform well with therapy. Practiced gait training and stair training. All education completed. Ready to d/c from PT standpoint.   Follow Up Recommendations  Home health PT     Equipment Recommendations  None recommended by PT    Recommendations for Other Services       Precautions / Restrictions Precautions Precautions: Fall Restrictions Weight Bearing Restrictions: No RLE Weight Bearing: Weight bearing as tolerated    Mobility  Bed Mobility               General bed mobility comments: pt in chair  Transfers Overall transfer level: Needs assistance Equipment used: Rolling walker (2 wheeled) Transfers: Sit to/from Stand Sit to Stand: Min guard Stand pivot transfers: Min guard       General transfer comment: close guard for safety.  Ambulation/Gait Ambulation/Gait assistance: Min guard Ambulation Distance (Feet): 75 Feet Assistive device: Rolling walker (2 wheeled) Gait Pattern/deviations: Step-to pattern;Trunk flexed     General Gait Details: close guard for safety.    Stairs Stairs: Yes Stairs assistance: Min assist Stair Management: Step to pattern;Forwards;Two rails Number of Stairs: 2 General stair comments: Pt stated whe will have 2 person assist getting into home + 1 rail. VCs safety, sequence, technique. Small amount of assist to stabilize.   Wheelchair Mobility    Modified Rankin (Stroke Patients Only)       Balance                                    Cognition Arousal/Alertness: Awake/alert Behavior During Therapy: WFL for tasks assessed/performed Overall Cognitive Status: Within Functional Limits for tasks assessed                      Exercises      General Comments         Pertinent Vitals/Pain Pain Assessment: 0-10 Pain Score: 5  Pain Location: R thigh Pain Descriptors / Indicators: Sore Pain Intervention(s): Monitored during session;Repositioned;Ice applied    Home Living Family/patient expects to be discharged to:: Private residence Living Arrangements: Spouse/significant other Available Help at Discharge: Family Type of Home: House Home Access: Stairs to enter Entrance Stairs-Rails: Right Home Layout: One level Home Equipment: Environmental consultant - 2 wheels      Prior Function Level of Independence: Independent          PT Goals (current goals can now be found in the care plan section) Acute Rehab PT Goals Patient Stated Goal: regain I Progress towards PT goals: Progressing toward goals    Frequency  7X/week    PT Plan Current plan remains appropriate    Co-evaluation             End of Session Equipment Utilized During Treatment: Gait belt Activity Tolerance: Patient tolerated treatment well Patient left: in chair;with call bell/phone within reach     Time: 1323-1336 PT Time Calculation (min) (ACUTE ONLY): 13 min  Charges:  $Gait Training: 8-22 mins                    G Codes:      Weston Anna, MPT Pager: [phone]

## 2016-03-13 NOTE — Care Management Note (Signed)
Case Management Note  Patient Details  Name: Ashley Ruiz MRN: [id number] Date of Birth: May 05, 1929  Subjective/Objective:      82 yo admitted with OA              Action/Plan: From home with spouse.  Expected Discharge Date:                  Expected Discharge Plan:  [place]  In-House Referral:     Discharge planning Services  CM Consult  Post Acute Care Choice:  Home Health Choice offered to:  Patient  DME Arranged:    DME Agency:     HH Arranged:  PT HH Agency:  [facility]  Status of Service:  In process, will continue to follow  Medicare Important Message Given:    Date Medicare IM Given:    Medicare IM give by:    Date Additional Medicare IM Given:    Additional Medicare Important Message give by:     If discussed at [place] of Stay Meetings, dates discussed:    Additional Comments: This CM met with pt at bedside to discuss DC needs.  PT is recommending HHPT. No DME recommended.  Pt on [place] list for [facility] services and this was confirmed with pt that [facility] was her choice for [facility].  MD orders placed for [facility] and rep alerted of referral.  No other CM needs communicated. Lynnell Catalan, RN 03/13/2016, 1:48 PM [phone]

## 2016-03-13 NOTE — Evaluation (Signed)
Occupational Therapy Evaluation Patient Details Name: Ashley Ruiz MRN: [id number] DOB: 1929-03-21 Today's Date: 03/13/2016    History of Present Illness 82 yo female s/p R THA 03/12/16.    Clinical Impression    OT education complete regarding ADL activity s/p THA    Follow Up Recommendations  No OT follow up    Equipment Recommendations  None recommended by OT    Recommendations for Other Services       Precautions / Restrictions Precautions Precautions: Fall Restrictions Weight Bearing Restrictions: No RLE Weight Bearing: Weight bearing as tolerated      Mobility Bed Mobility Overal bed mobility: Needs Assistance Bed Mobility: Supine to Sit     Supine to sit: Min assist     General bed mobility comments: pt in chair  Transfers Overall transfer level: Needs assistance Equipment used: Rolling walker (2 wheeled) Transfers: Sit to/from [facility] Sit to Stand: Supervision Stand pivot transfers: Supervision       General transfer comment: close guard for safety.    Balance                                            ADL Overall ADL's : Needs assistance/impaired                                       General ADL Comments: Pt overall S- min A with ADL activity.. Education complete regarding ADL activity s/p THA- including dressing, toileting, shower transfer and functional mobility               Pertinent Vitals/Pain Pain Assessment: 0-10 Pain Score: 3  Pain Location: r hip Pain Descriptors / Indicators: Sore Pain Intervention(s): Monitored during session     Hand Dominance     Extremity/Trunk Assessment Upper Extremity Assessment Upper Extremity Assessment: Overall WFL for tasks assessed           Communication Communication Communication: No difficulties   Cognition Arousal/Alertness: Awake/alert Behavior During Therapy: WFL for tasks assessed/performed Overall Cognitive  Status: Within Functional Limits for tasks assessed                                Home Living Family/patient expects to be discharged to:: Private residence Living Arrangements: Spouse/significant other Available Help at Discharge: Family Type of Home: House Home Access: Stairs to enter Technical brewer of Steps: 3 Entrance Stairs-Rails: Right Home Layout: One level     Bathroom Shower/Tub: Occupational psychologist: Handicapped height     Home Equipment: Environmental consultant - 2 wheels          Prior Functioning/Environment Level of Independence: Independent             OT Diagnosis:     OT Problem List:     OT Treatment/Interventions:      OT Goals(Current goals can be found in the care plan section) Acute Rehab OT Goals Patient Stated Goal: regain I  OT Frequency:     Barriers to D/C:            Co-evaluation              End of Session Equipment Utilized During Treatment: Surveyor, mining Communication: Mobility status  Activity Tolerance: Patient tolerated treatment well Patient left:  in chair with call bell within reach   Time: 1228-1240 OT Time Calculation (min): 12 min Charges:  OT General Charges $OT Visit: 1 Procedure OT Evaluation $OT Eval Low Complexity: 1 Procedure G-Codes:    Betsy Pries Mar 15, 2016, 12:48 PM

## 2016-03-13 NOTE — Discharge Instructions (Signed)
Dr. Gaynelle Arabian Total Joint Specialist [facility] [address], [place], [place] [address] [phone]  ANTERIOR APPROACH TOTAL HIP REPLACEMENT POSTOPERATIVE DIRECTIONS   Hip Rehabilitation, Guidelines Following Surgery  The results of a hip operation are greatly improved after range of motion and muscle strengthening exercises. Follow all safety measures which are given to protect your hip. If any of these exercises cause increased pain or swelling in your joint, decrease the amount until you are comfortable again. Then slowly increase the exercises. Call your caregiver if you have problems or questions.   HOME CARE INSTRUCTIONS  Remove items at home which could result in a fall. This includes throw rugs or furniture in walking pathways.   ICE to the affected hip every three hours for 30 minutes at a time and then as needed for pain and swelling.  Continue to use ice on the hip for pain and swelling from surgery. You may notice swelling that will progress down to the foot and ankle.  This is normal after surgery.  Elevate the leg when you are not up walking on it.    Continue to use the breathing machine which will help keep your temperature down.  It is common for your temperature to cycle up and down following surgery, especially at night when you are not up moving around and exerting yourself.  The breathing machine keeps your lungs expanded and your temperature down.   DIET You may resume your previous home diet once your are discharged from the hospital.  DRESSING / WOUND CARE / SHOWERING You may start showering once you are discharged home but do not submerge the incision under water. Just pat the incision dry and apply a dry gauze dressing on daily. Change the surgical dressing daily and reapply a dry dressing each time.  ACTIVITY Walk with your walker as instructed. Use walker as long as suggested by your caregivers. Avoid periods of inactivity  such as sitting longer than an hour when not asleep. This helps prevent blood clots.  You may resume a sexual relationship in one month or when given the OK by your doctor.  You may return to work once you are cleared by your doctor.  Do not drive a car for 6 weeks or until released by you surgeon.  Do not drive while taking narcotics.  WEIGHT BEARING Weight bearing as tolerated with assist device (walker, cane, etc) as directed, use it as long as suggested by your surgeon or therapist, typically at least 4-6 weeks.  POSTOPERATIVE CONSTIPATION PROTOCOL Constipation - defined medically as fewer than three stools per week and severe constipation as less than one stool per week.  One of the most common issues patients have following surgery is constipation.  Even if you have a regular bowel pattern at home, your normal regimen is likely to be disrupted due to multiple reasons following surgery.  Combination of anesthesia, postoperative narcotics, change in appetite and fluid intake all can affect your bowels.  In order to avoid complications following surgery, here are some recommendations in order to help you during your recovery period.  Colace (docusate) - Pick up an over-the-counter form of Colace or another stool softener and take twice a day as long as you are requiring postoperative pain medications.  Take with a full glass of water daily.  If you experience loose stools or diarrhea, hold the colace until you stool forms back up.  If your symptoms do not get better within 1  week or if they get worse, check with your doctor. ° °Dulcolax (bisacodyl) - Pick up over-the-counter and take as directed by the product packaging as needed to assist with the movement of your bowels.  Take with a full glass of water.  Use this product as needed if not relieved by Colace only.  ° °MiraLax (polyethylene glycol) - Pick up over-the-counter to have on hand.  MiraLax is a solution that will increase the amount of  water in your bowels to assist with bowel movements.  Take as directed and can mix with a glass of water, juice, soda, coffee, or tea.  Take if you go more than two days without a movement. °Do not use MiraLax more than once per day. Call your doctor if you are still constipated or irregular after using this medication for 7 days in a row. ° °If you continue to have problems with postoperative constipation, please contact the office for further assistance and recommendations.  If you experience "the worst abdominal pain ever" or develop nausea or vomiting, please contact the office immediatly for further recommendations for treatment. ° °ITCHING ° If you experience itching with your medications, try taking only a single pain pill, or even half a pain pill at a time.  You can also use Benadryl over the counter for itching or also to help with sleep.  ° °TED HOSE STOCKINGS °Wear the elastic stockings on both legs for three weeks following surgery during the day but you may remove then at night for sleeping. ° °MEDICATIONS °See your medication summary on the “After Visit Summary” that the nursing staff will review with you prior to discharge.  You may have some home medications which will be placed on hold until you complete the course of blood thinner medication.  It is important for you to complete the blood thinner medication as prescribed by your surgeon.  Continue your approved medications as instructed at time of discharge. ° °PRECAUTIONS °If you experience chest pain or shortness of breath - call 911 immediately for transfer to the hospital emergency department.  °If you develop a fever greater that 101 F, purulent drainage from wound, increased redness or drainage from wound, foul odor from the wound/dressing, or calf pain - CONTACT YOUR SURGEON.   °                                                °FOLLOW-UP APPOINTMENTS °Make sure you keep all of your appointments after your operation with your surgeon and  caregivers. You should call the office at the above phone number and make an appointment for approximately two weeks after the date of your surgery or on the date instructed by your surgeon outlined in the "After Visit Summary". ° °RANGE OF MOTION AND STRENGTHENING EXERCISES  °These exercises are designed to help you keep full movement of your hip joint. Follow your caregiver's or physical therapist's instructions. Perform all exercises about fifteen times, three times per day or as directed. Exercise both hips, even if you have had only one joint replacement. These exercises can be done on a training (exercise) mat, on the floor, on a table or on a bed. Use whatever works the best and is most comfortable for you. Use music or television while you are exercising so that the exercises are a pleasant break in your day. This   will make your life better with the exercises acting as a break in routine you can look forward to.  °Lying on your back, slowly slide your foot toward your buttocks, raising your knee up off the floor. Then slowly slide your foot back down until your leg is straight again.  °Lying on your back spread your legs as far apart as you can without causing discomfort.  °Lying on your side, raise your upper leg and foot straight up from the floor as far as is comfortable. Slowly lower the leg and repeat.  °Lying on your back, tighten up the muscle in the front of your thigh (quadriceps muscles). You can do this by keeping your leg straight and trying to raise your heel off the floor. This helps strengthen the largest muscle supporting your knee.  °Lying on your back, tighten up the muscles of your buttocks both with the legs straight and with the knee bent at a comfortable angle while keeping your heel on the floor.  ° °IF YOU ARE TRANSFERRED TO A SKILLED REHAB FACILITY °If the patient is transferred to a skilled rehab facility following release from the hospital, a list of the current medications will be  sent to the facility for the patient to continue.  When discharged from the skilled rehab facility, please have the facility set up the patient's Home Health Physical Therapy prior to being released. Also, the skilled facility will be responsible for providing the patient with their medications at time of release from the facility to include their pain medication, the muscle relaxants, and their blood thinner medication. If the patient is still at the rehab facility at time of the two week follow up appointment, the skilled rehab facility will also need to assist the patient in arranging follow up appointment in our office and any transportation needs. ° °MAKE SURE YOU:  °Understand these instructions.  °Get help right away if you are not doing well or get worse.  ° ° °Pick up stool softner and laxative for home use following surgery while on pain medications. °Do not submerge incision under water. °Please use good hand washing techniques while changing dressing each day. °May shower starting three days after surgery. °Please use a clean towel to pat the incision dry following showers. °Continue to use ice for pain and swelling after surgery. °Do not use any lotions or creams on the incision until instructed by your surgeon. °Information on my medicine - XARELTO® (Rivaroxaban) ° °This medication education was reviewed with me or my healthcare representative as part of my discharge preparation.  The pharmacist that spoke with me during my hospital stay was:  Deserea Bordley E, RPH ° °Why was Xarelto® prescribed for you? °Xarelto® was prescribed for you to reduce the risk of blood clots forming after orthopedic surgery. The medical term for these abnormal blood clots is venous thromboembolism (VTE). ° °What do you need to know about xarelto® ? °Take your Xarelto® ONCE DAILY at the same time every day. °You may take it either with or without food. ° °If you have difficulty swallowing the tablet whole, you may crush it  and mix in applesauce just prior to taking your dose. ° °Take Xarelto® exactly as prescribed by your doctor and DO NOT stop taking Xarelto® without talking to the doctor who prescribed the medication.  Stopping without other VTE prevention medication to take the place of Xarelto® may increase your risk of developing a clot. ° °After discharge, you should have regular check-up   appointments with your healthcare provider that is prescribing your Xarelto®.   ° °What do you do if you miss a dose? °If you miss a dose, take it as soon as you remember on the same day then continue your regularly scheduled once daily regimen the next day. Do not take two doses of Xarelto® on the same day.  ° °Important Safety Information °A possible side effect of Xarelto® is bleeding. You should call your healthcare provider right away if you experience any of the following: °? Bleeding from an injury or your nose that does not stop. °? Unusual colored urine (red or dark brown) or unusual colored stools (red or black). °? Unusual bruising for unknown reasons. °? A serious fall or if you hit your head (even if there is no bleeding). ° °Some medicines may interact with Xarelto® and might increase your risk of bleeding while on Xarelto®. To help avoid this, consult your healthcare provider or pharmacist prior to using any new prescription or non-prescription medications, including herbals, vitamins, non-steroidal anti-inflammatory drugs (NSAIDs) and supplements. ° °This website has more information on Xarelto®: [email or web address].com. ° ° ° °

## 2016-03-14 DIAGNOSIS — Z471 Aftercare following joint replacement surgery: Secondary | ICD-10-CM | POA: Diagnosis not present

## 2016-03-14 DIAGNOSIS — Z96651 Presence of right artificial knee joint: Secondary | ICD-10-CM | POA: Diagnosis not present

## 2016-03-14 DIAGNOSIS — M1712 Unilateral primary osteoarthritis, left knee: Secondary | ICD-10-CM | POA: Diagnosis not present

## 2016-03-14 DIAGNOSIS — I10 Essential (primary) hypertension: Secondary | ICD-10-CM | POA: Diagnosis not present

## 2016-03-14 DIAGNOSIS — Z96641 Presence of right artificial hip joint: Secondary | ICD-10-CM | POA: Diagnosis not present

## 2016-03-14 DIAGNOSIS — Z87891 Personal history of nicotine dependence: Secondary | ICD-10-CM | POA: Diagnosis not present

## 2016-03-18 DIAGNOSIS — M1712 Unilateral primary osteoarthritis, left knee: Secondary | ICD-10-CM | POA: Diagnosis not present

## 2016-03-18 DIAGNOSIS — Z96641 Presence of right artificial hip joint: Secondary | ICD-10-CM | POA: Diagnosis not present

## 2016-03-18 DIAGNOSIS — Z96651 Presence of right artificial knee joint: Secondary | ICD-10-CM | POA: Diagnosis not present

## 2016-03-18 DIAGNOSIS — Z471 Aftercare following joint replacement surgery: Secondary | ICD-10-CM | POA: Diagnosis not present

## 2016-03-18 DIAGNOSIS — Z87891 Personal history of nicotine dependence: Secondary | ICD-10-CM | POA: Diagnosis not present

## 2016-03-18 DIAGNOSIS — I10 Essential (primary) hypertension: Secondary | ICD-10-CM | POA: Diagnosis not present

## 2016-03-19 NOTE — Discharge Summary (Signed)
Physician Discharge Summary   Patient ID: Ashley Ruiz MRN: [id number] DOB/AGE: [birth year] 82 y.o.  Admit date: 03/12/2016 Discharge date: 03/13/2016  Primary Diagnosis:  Osteoarthritis of the Right hip.  Osteoarthritis left knee Admission Diagnoses:  Past Medical History  Diagnosis Date  . Hypertension   . Heart murmur   . PONV (postoperative nausea and vomiting)   . Arthritis     OA AND PAIN BOTH KNEES; SOME PAIN IN RT HIP  . Hypercholesteremia   . Stroke [facility])     ?   SMALL TIA , PATIENT HAD 1 EPISODE DOUBLE VISION 12/2014  . Cancer (HCC)     SKIN CANCER ON LT EAR  . Aortic stenosis     peak/mean pressure gradient 28/16 mmHg by echo at [facility] 12/28/15  . Moderate aortic stenosis 02/01/2016  . Essential hypertension 02/01/2016  . Hip arthritis 02/01/2016  . Hyperlipidemia 02/01/2016   Discharge Diagnoses:   Principal Problem:   OA (osteoarthritis) of hip  Estimated body mass index is 26.62 kg/(m^2) as calculated from the following:   Height as of this encounter: _0  (1.575 m).   Weight as of this encounter: 66.027 kg (145 lb 9 oz).  Procedure(s) (LRB): RIGHT TOTAL HIP ARTHROPLASTY ANTERIOR APPROACH WITH LEFT KNEE INJECTION (Right)   Consults: None  HPI: Ashley Ruiz is a 82 y.o. female who has advanced end-  stage arthritis of her Right hip with progressively worsening pain and  dysfunction.The patient has failed nonoperative management and presents for  total hip arthroplasty. She also has symptomatic osteoarthritis of her left knee and requests cortisone injection.  Laboratory Data: Admission on 03/12/2016, Discharged on 03/13/2016  Component Date Value Ref Range Status  . WBC 03/13/2016 13.2* 4.0 - 10.5 K/uL Final  . RBC 03/13/2016 3.97  3.87 - 5.11 MIL/uL Final  . Hemoglobin 03/13/2016 10.8* 12.0 - 15.0 g/dL Final  . HCT 03/13/2016 31.7* 36.0 - 46.0 % Final  . MCV 03/13/2016 79.8  78.0 - 100.0 fL Final  . MCH  03/13/2016 27.2  26.0 - 34.0 pg Final  . MCHC 03/13/2016 34.1  30.0 - 36.0 g/dL Final  . RDW 03/13/2016 13.2  11.5 - 15.5 % Final  . Platelets 03/13/2016 155  150 - 400 K/uL Final  . Sodium 03/13/2016 140  135 - 145 mmol/L Final  . Potassium 03/13/2016 3.7  3.5 - 5.1 mmol/L Final  . Chloride 03/13/2016 108  101 - 111 mmol/L Final  . CO2 03/13/2016 24  22 - 32 mmol/L Final  . Glucose, Bld 03/13/2016 126* 65 - 99 mg/dL Final  . BUN 03/13/2016 12  6 - 20 mg/dL Final  . Creatinine, Ser 03/13/2016 0.65  0.44 - 1.00 mg/dL Final  . Calcium 03/13/2016 8.8* 8.9 - 10.3 mg/dL Final  . GFR calc non Af Amer 03/13/2016 >60  >60 mL/min Final  . GFR calc Af Amer 03/13/2016 >60  >60 mL/min Final   Comment: (NOTE) The eGFR has been calculated using the CKD EPI equation. This calculation has not been validated in all clinical situations. eGFR's persistently <60 mL/min signify possible Chronic Kidney Disease.   Georgiann Hahn gap 03/13/2016 8  5 - 15 Final  Hospital Outpatient Visit on 03/05/2016  Component Date Value Ref Range Status  . aPTT 03/05/2016 36  24 - 37 seconds Final  . WBC 03/05/2016 7.1  4.0 - 10.5 K/uL Final  . RBC 03/05/2016 5.17* 3.87 - 5.11 MIL/uL Final  . Hemoglobin 03/05/2016 14.6  12.0 - 15.0 g/dL Final  . HCT 03/05/2016 42.9  36.0 - 46.0 % Final  . MCV 03/05/2016 83.0  78.0 - 100.0 fL Final  . MCH 03/05/2016 28.2  26.0 - 34.0 pg Final  . MCHC 03/05/2016 34.0  30.0 - 36.0 g/dL Final  . RDW 03/05/2016 13.2  11.5 - 15.5 % Final  . Platelets 03/05/2016 193  150 - 400 K/uL Final  . Sodium 03/05/2016 142  135 - 145 mmol/L Final  . Potassium 03/05/2016 5.3* 3.5 - 5.1 mmol/L Final  . Chloride 03/05/2016 104  101 - 111 mmol/L Final  . CO2 03/05/2016 28  22 - 32 mmol/L Final  . Glucose, Bld 03/05/2016 102* 65 - 99 mg/dL Final  . BUN 03/05/2016 23* 6 - 20 mg/dL Final  . Creatinine, Ser 03/05/2016 0.75  0.44 - 1.00 mg/dL Final  . Calcium 03/05/2016 10.3  8.9 - 10.3 mg/dL Final  . Total  Protein 03/05/2016 7.5  6.5 - 8.1 g/dL Final  . Albumin 03/05/2016 4.4  3.5 - 5.0 g/dL Final  . AST 03/05/2016 24  15 - 41 U/L Final  . ALT 03/05/2016 15  14 - 54 U/L Final  . Alkaline Phosphatase 03/05/2016 70  38 - 126 U/L Final  . Total Bilirubin 03/05/2016 0.2* 0.3 - 1.2 mg/dL Final  . GFR calc non Af Amer 03/05/2016 >60  >60 mL/min Final  . GFR calc Af Amer 03/05/2016 >60  >60 mL/min Final   Comment: (NOTE) The eGFR has been calculated using the CKD EPI equation. This calculation has not been validated in all clinical situations. eGFR's persistently <60 mL/min signify possible Chronic Kidney Disease.   . Anion gap 03/05/2016 10  5 - 15 Final  . Prothrombin Time 03/05/2016 14.1  11.6 - 15.2 seconds Final  . INR 03/05/2016 1.07  0.00 - 1.49 Final  . ABO/RH(D) 03/05/2016 O NEG   Final  . Antibody Screen 03/05/2016 NEG   Final  . Sample Expiration 03/05/2016 03/15/2016   Final  . Extend sample reason 03/05/2016 NO TRANSFUSIONS OR PREGNANCY IN THE PAST 3 MONTHS   Final  . Color, Urine 03/05/2016 YELLOW  YELLOW Final  . APPearance 03/05/2016 CLEAR  CLEAR Final  . Specific Gravity, Urine 03/05/2016 1.011  1.005 - 1.030 Final  . pH 03/05/2016 7.0  5.0 - 8.0 Final  . Glucose, UA 03/05/2016 NEGATIVE  NEGATIVE mg/dL Final  . Hgb urine dipstick 03/05/2016 NEGATIVE  NEGATIVE Final  . Bilirubin Urine 03/05/2016 NEGATIVE  NEGATIVE Final  . Ketones, ur 03/05/2016 NEGATIVE  NEGATIVE mg/dL Final  . Protein, ur 03/05/2016 NEGATIVE  NEGATIVE mg/dL Final  . Nitrite 03/05/2016 NEGATIVE  NEGATIVE Final  . Leukocytes, UA 03/05/2016 NEGATIVE  NEGATIVE Final   MICROSCOPIC NOT DONE ON URINES WITH NEGATIVE PROTEIN, BLOOD, LEUKOCYTES, NITRITE, OR GLUCOSE <1000 mg/dL.  Marland Kitchen MRSA, PCR 03/05/2016 NEGATIVE  NEGATIVE Final  . Staphylococcus aureus 03/05/2016 POSITIVE* NEGATIVE Final   Comment:        The Xpert SA Assay (FDA approved for NASAL specimens in patients over 1 years of age), is one component of a  comprehensive surveillance program.  Test performance has been validated by [facility] for patients greater than or equal to 5 year old. It is not intended to diagnose infection nor to guide or monitor treatment.   Hospital Outpatient Visit on 02/13/2016  Component Date Value Ref Range Status  . Rest HR 02/13/2016 69   Final  . Rest BP 02/13/2016 142/60  Final  . Peak HR 02/13/2016 107   Final  . Peak BP 02/13/2016 154/65   Final  . LV sys vol 02/13/2016 16   Final  . TID 02/13/2016 0.95   Final  . LV dias vol 02/13/2016 60  46 - 106 mL Final  . SSS 02/13/2016 1   Final  . SRS 02/13/2016 0   Final  . SDS 02/13/2016 1   Final     X-Rays:Dg Pelvis Portable  03/12/2016  CLINICAL DATA:  Rt total hip replacement .2 min fluoro; Post op right total hip replacement, anterior approach, Dr. Wynelle Link EXAM: DG C-ARM 1-60 MIN-NO REPORT; PORTABLE PELVIS 1-2 VIEWS COMPARISON:  None FINDINGS: RIGHT hip total arthroplasty. Surgical drain in place. No fracture or dislocation. IMPRESSION: No complication following RIGHT hip arthroplasty. Electronically Signed   By: Suzy Bouchard M.D.   On: 03/12/2016 10:24   Dg C-arm 1-60 Min-no Report  03/12/2016  CLINICAL DATA: surgery C-ARM 1-60 MINUTES Fluoroscopy was utilized by the requesting physician.  No radiographic interpretation.    EKG: Orders placed or performed during the hospital encounter of 03/05/16  . EKG 12-Lead  . EKG 12-Lead     Hospital Course: Patient was admitted to [facility] and taken to the OR and underwent the above state procedure without complications.  Patient tolerated the procedure well and was later transferred to the recovery room and then to the orthopaedic floor for postoperative care.  They were given PO and IV analgesics for pain control following their surgery.  They were given 24 hours of postoperative antibiotics of  Anti-infectives    Start     Dose/Rate Route Frequency Ordered Stop   03/12/16 1400   ceFAZolin (ANCEF) IVPB 2g/100 mL premix     2 g 200 mL/hr over 30 Minutes Intravenous Every 6 hours 03/12/16 1116 03/12/16 2048   03/12/16 0652  ceFAZolin (ANCEF) IVPB 2g/100 mL premix     2 g 200 mL/hr over 30 Minutes Intravenous On call to O.R. 03/12/16 [id number] 03/12/16 0830     and started on DVT prophylaxis in the form of Xarelto.   PT and OT were ordered for total hip protocol.  The patient was allowed to be WBAT with therapy. Discharge planning was consulted to help with postop disposition and equipment needs.  Patient had a good night on the evening of surgery.  They started to get up OOB with therapy on day one.  Hemovac drain was pulled without difficulty. Patient was seen in rounds on POD 1and was ready to go home following therapy.  Diet: Cardiac diet Activity:WBAT Follow-up:in 2 weeks Disposition - Home Discharged Condition: good   Discharge Instructions    Call MD / Call 911    Complete by:  As directed   If you experience chest pain or shortness of breath, CALL 911 and be transported to the hospital emergency room.  If you develope a fever above 101 F, pus (white drainage) or increased drainage or redness at the wound, or calf pain, call your surgeon's office.     Constipation Prevention    Complete by:  As directed   Drink plenty of fluids.  Prune juice may be helpful.  You may use a stool softener, such as Colace (over the counter) 100 mg twice a day.  Use MiraLax (over the counter) for constipation as needed.     Diet - low sodium heart healthy    Complete by:  As directed  Discharge instructions    Complete by:  As directed   Dr. Gaynelle Arabian Total Joint Specialist [facility] [address], [place], [place] [address] [phone]  ANTERIOR APPROACH TOTAL HIP REPLACEMENT POSTOPERATIVE DIRECTIONS   Hip Rehabilitation, Guidelines Following Surgery  The results of a hip operation are greatly improved after range of motion and muscle  strengthening exercises. Follow all safety measures which are given to protect your hip. If any of these exercises cause increased pain or swelling in your joint, decrease the amount until you are comfortable again. Then slowly increase the exercises. Call your caregiver if you have problems or questions.   HOME CARE INSTRUCTIONS  Remove items at home which could result in a fall. This includes throw rugs or furniture in walking pathways.  ICE to the affected hip every three hours for 30 minutes at a time and then as needed for pain and swelling.  Continue to use ice on the hip for pain and swelling from surgery. You may notice swelling that will progress down to the foot and ankle.  This is normal after surgery.  Elevate the leg when you are not up walking on it.   Continue to use the breathing machine which will help keep your temperature down.  It is common for your temperature to cycle up and down following surgery, especially at night when you are not up moving around and exerting yourself.  The breathing machine keeps your lungs expanded and your temperature down.   DIET You may resume your previous home diet once your are discharged from the hospital.  DRESSING / WOUND CARE / SHOWERING You may start showering once you are discharged home but do not submerge the incision under water. Just pat the incision dry and apply a dry gauze dressing on daily. Change the surgical dressing daily and reapply a dry dressing each time.  ACTIVITY Walk with your walker as instructed. Use walker as long as suggested by your caregivers. Avoid periods of inactivity such as sitting longer than an hour when not asleep. This helps prevent blood clots.  You may resume a sexual relationship in one month or when given the OK by your doctor.  You may return to work once you are cleared by your doctor.  Do not drive a car for 6 weeks or until released by you surgeon.  Do not drive while taking narcotics.  WEIGHT  BEARING Weight bearing as tolerated with assist device (walker, cane, etc) as directed, use it as long as suggested by your surgeon or therapist, typically at least 4-6 weeks.  POSTOPERATIVE CONSTIPATION PROTOCOL Constipation - defined medically as fewer than three stools per week and severe constipation as less than one stool per week.  One of the most common issues patients have following surgery is constipation.  Even if you have a regular bowel pattern at home, your normal regimen is likely to be disrupted due to multiple reasons following surgery.  Combination of anesthesia, postoperative narcotics, change in appetite and fluid intake all can affect your bowels.  In order to avoid complications following surgery, here are some recommendations in order to help you during your recovery period.  Colace (docusate) - Pick up an over-the-counter form of Colace or another stool softener and take twice a day as long as you are requiring postoperative pain medications.  Take with a full glass of water daily.  If you experience loose stools or diarrhea, hold the colace until you stool forms back up.  If your symptoms do not get better within 1 week or if they get worse, check with your doctor.  Dulcolax (bisacodyl) - Pick up over-the-counter and take as directed by the product packaging as needed to assist with the movement of your bowels.  Take with a full glass of water.  Use this product as needed if not relieved by Colace only.   MiraLax (polyethylene glycol) - Pick up over-the-counter to have on hand.  MiraLax is a solution that will increase the amount of water in your bowels to assist with bowel movements.  Take as directed and can mix with a glass of water, juice, soda, coffee, or tea.  Take if you go more than two days without a movement. Do not use MiraLax more than once per day. Call your doctor if you are still constipated or irregular after using this medication for 7 days in a row.  If you  continue to have problems with postoperative constipation, please contact the office for further assistance and recommendations.  If you experience "the worst abdominal pain ever" or develop nausea or vomiting, please contact the office immediatly for further recommendations for treatment.  ITCHING  If you experience itching with your medications, try taking only a single pain pill, or even half a pain pill at a time.  You can also use Benadryl over the counter for itching or also to help with sleep.   TED HOSE STOCKINGS Wear the elastic stockings on both legs for three weeks following surgery during the day but you may remove then at night for sleeping.  MEDICATIONS See your medication summary on the "After Visit Summary" that the nursing staff will review with you prior to discharge.  You may have some home medications which will be placed on hold until you complete the course of blood thinner medication.  It is important for you to complete the blood thinner medication as prescribed by your surgeon.  Continue your approved medications as instructed at time of discharge.  PRECAUTIONS If you experience chest pain or shortness of breath - call 911 immediately for transfer to the hospital emergency department.  If you develop a fever greater that 101 F, purulent drainage from wound, increased redness or drainage from wound, foul odor from the wound/dressing, or calf pain - CONTACT YOUR SURGEON.                                                   FOLLOW-UP APPOINTMENTS Make sure you keep all of your appointments after your operation with your surgeon and caregivers. You should call the office at the above phone number and make an appointment for approximately two weeks after the date of your surgery or on the date instructed by your surgeon outlined in the "After Visit Summary".  RANGE OF MOTION AND STRENGTHENING EXERCISES  These exercises are designed to help you keep full movement of your hip joint.  Follow your caregiver's or physical therapist's instructions. Perform all exercises about fifteen times, three times per day or as directed. Exercise both hips, even if you have had only one joint replacement. These exercises can be done on a training (exercise) mat, on the floor, on a table or on a bed. Use whatever works the best and is most comfortable for you. Use music or television while you are exercising so that the  exercises are a pleasant break in your day. This will make your life better with the exercises acting as a break in routine you can look forward to.  Lying on your back, slowly slide your foot toward your buttocks, raising your knee up off the floor. Then slowly slide your foot back down until your leg is straight again.  Lying on your back spread your legs as far apart as you can without causing discomfort.  Lying on your side, raise your upper leg and foot straight up from the floor as far as is comfortable. Slowly lower the leg and repeat.  Lying on your back, tighten up the muscle in the front of your thigh (quadriceps muscles). You can do this by keeping your leg straight and trying to raise your heel off the floor. This helps strengthen the largest muscle supporting your knee.  Lying on your back, tighten up the muscles of your buttocks both with the legs straight and with the knee bent at a comfortable angle while keeping your heel on the floor.   IF YOU ARE TRANSFERRED TO A SKILLED REHAB FACILITY If the patient is transferred to a skilled rehab facility following release from the hospital, a list of the current medications will be sent to the facility for the patient to continue.  When discharged from the skilled rehab facility, please have the facility set up the patient's [place] prior to being released. Also, the skilled facility will be responsible for providing the patient with their medications at time of release from the facility to include their pain  medication, the muscle relaxants, and their blood thinner medication. If the patient is still at the rehab facility at time of the two week follow up appointment, the skilled rehab facility will also need to assist the patient in arranging follow up appointment in our office and any transportation needs.  MAKE SURE YOU:  Understand these instructions.  Get help right away if you are not doing well or get worse.    Pick up stool softner and laxative for home use following surgery while on pain medications. Do not submerge incision under water. Please use good hand washing techniques while changing dressing each day. May shower starting three days after surgery. Please use a clean towel to pat the incision dry following showers. Continue to use ice for pain and swelling after surgery. Do not use any lotions or creams on the incision until instructed by your surgeon.     Increase activity slowly as tolerated    Complete by:  As directed             Medication List    STOP taking these medications        aspirin 81 MG tablet     Biotin 1000 MCG tablet     co-enzyme Q-10 30 MG capsule     Vitamin D2 2000 units Tabs      TAKE these medications        acetaminophen 500 MG tablet  Commonly known as:  TYLENOL  Take 1,000 mg by mouth every 6 (six) hours as needed for moderate pain.     amLODipine 5 MG tablet  Commonly known as:  NORVASC  Take 1 tablet (5 mg total) by mouth daily.     latanoprost 0.005 % ophthalmic solution  Commonly known as:  XALATAN  Place 1 drop into both eyes at bedtime.     losartan 100 MG tablet  Commonly known as:  COZAAR  Take 100 mg by mouth every morning.     rivaroxaban 10 MG Tabs tablet  Commonly known as:  XARELTO  Take 1 tablet (10 mg total) by mouth daily with breakfast.     simvastatin 10 MG tablet  Commonly known as:  ZOCOR  Take 10 mg by mouth at bedtime.     spironolactone 25 MG tablet  Commonly known as:  ALDACTONE  Take 25 mg  by mouth every other day.     traMADol 50 MG tablet  Commonly known as:  ULTRAM  Take 50 mg by mouth every 6 (six) hours as needed for moderate pain.           Follow-up Information    Follow up with [name], MD. Schedule an appointment as soon as possible for a visit on 03/25/2016.   Specialty:  Orthopedic Surgery   Why:  Call [phone] tomorrow to make the appointment   Contact information:   [address] [place] [address] [phone]       Signed: Arlee Muslim, PA-C Orthopaedic Surgery 03/19/2016, 10:14 PM

## 2016-03-20 DIAGNOSIS — Z96641 Presence of right artificial hip joint: Secondary | ICD-10-CM | POA: Diagnosis not present

## 2016-03-20 DIAGNOSIS — Z471 Aftercare following joint replacement surgery: Secondary | ICD-10-CM | POA: Diagnosis not present

## 2016-03-20 DIAGNOSIS — Z96651 Presence of right artificial knee joint: Secondary | ICD-10-CM | POA: Diagnosis not present

## 2016-03-20 DIAGNOSIS — Z87891 Personal history of nicotine dependence: Secondary | ICD-10-CM | POA: Diagnosis not present

## 2016-03-20 DIAGNOSIS — I10 Essential (primary) hypertension: Secondary | ICD-10-CM | POA: Diagnosis not present

## 2016-03-20 DIAGNOSIS — M1712 Unilateral primary osteoarthritis, left knee: Secondary | ICD-10-CM | POA: Diagnosis not present

## 2016-03-24 DIAGNOSIS — Z471 Aftercare following joint replacement surgery: Secondary | ICD-10-CM | POA: Diagnosis not present

## 2016-03-24 DIAGNOSIS — Z96651 Presence of right artificial knee joint: Secondary | ICD-10-CM | POA: Diagnosis not present

## 2016-03-24 DIAGNOSIS — Z87891 Personal history of nicotine dependence: Secondary | ICD-10-CM | POA: Diagnosis not present

## 2016-03-24 DIAGNOSIS — M1712 Unilateral primary osteoarthritis, left knee: Secondary | ICD-10-CM | POA: Diagnosis not present

## 2016-03-24 DIAGNOSIS — Z96641 Presence of right artificial hip joint: Secondary | ICD-10-CM | POA: Diagnosis not present

## 2016-03-24 DIAGNOSIS — I10 Essential (primary) hypertension: Secondary | ICD-10-CM | POA: Diagnosis not present

## 2016-03-25 DIAGNOSIS — Z471 Aftercare following joint replacement surgery: Secondary | ICD-10-CM | POA: Diagnosis not present

## 2016-03-25 DIAGNOSIS — Z96641 Presence of right artificial hip joint: Secondary | ICD-10-CM | POA: Diagnosis not present

## 2016-03-26 DIAGNOSIS — I10 Essential (primary) hypertension: Secondary | ICD-10-CM | POA: Diagnosis not present

## 2016-03-26 DIAGNOSIS — Z96641 Presence of right artificial hip joint: Secondary | ICD-10-CM | POA: Diagnosis not present

## 2016-03-26 DIAGNOSIS — M1712 Unilateral primary osteoarthritis, left knee: Secondary | ICD-10-CM | POA: Diagnosis not present

## 2016-03-26 DIAGNOSIS — Z96651 Presence of right artificial knee joint: Secondary | ICD-10-CM | POA: Diagnosis not present

## 2016-03-26 DIAGNOSIS — Z471 Aftercare following joint replacement surgery: Secondary | ICD-10-CM | POA: Diagnosis not present

## 2016-03-26 DIAGNOSIS — Z87891 Personal history of nicotine dependence: Secondary | ICD-10-CM | POA: Diagnosis not present

## 2016-04-15 DIAGNOSIS — Z471 Aftercare following joint replacement surgery: Secondary | ICD-10-CM | POA: Diagnosis not present

## 2016-04-15 DIAGNOSIS — Z96641 Presence of right artificial hip joint: Secondary | ICD-10-CM | POA: Diagnosis not present

## 2016-05-05 DIAGNOSIS — H9192 Unspecified hearing loss, left ear: Secondary | ICD-10-CM | POA: Diagnosis not present

## 2016-05-05 DIAGNOSIS — H6982 Other specified disorders of Eustachian tube, left ear: Secondary | ICD-10-CM | POA: Diagnosis not present

## 2016-05-05 DIAGNOSIS — H903 Sensorineural hearing loss, bilateral: Secondary | ICD-10-CM | POA: Diagnosis not present

## 2016-05-12 DIAGNOSIS — R6889 Other general symptoms and signs: Secondary | ICD-10-CM | POA: Diagnosis not present

## 2016-05-20 DIAGNOSIS — Z96641 Presence of right artificial hip joint: Secondary | ICD-10-CM | POA: Diagnosis not present

## 2016-05-20 DIAGNOSIS — Z471 Aftercare following joint replacement surgery: Secondary | ICD-10-CM | POA: Diagnosis not present

## 2016-05-26 DIAGNOSIS — H938X2 Other specified disorders of left ear: Secondary | ICD-10-CM | POA: Diagnosis not present

## 2016-05-26 DIAGNOSIS — H9192 Unspecified hearing loss, left ear: Secondary | ICD-10-CM | POA: Diagnosis not present

## 2016-06-03 DIAGNOSIS — L570 Actinic keratosis: Secondary | ICD-10-CM | POA: Diagnosis not present

## 2016-06-09 DIAGNOSIS — Z9181 History of falling: Secondary | ICD-10-CM | POA: Diagnosis not present

## 2016-06-09 DIAGNOSIS — Z0000 Encounter for general adult medical examination without abnormal findings: Secondary | ICD-10-CM | POA: Diagnosis not present

## 2016-06-09 DIAGNOSIS — H9042 Sensorineural hearing loss, unilateral, left ear, with unrestricted hearing on the contralateral side: Secondary | ICD-10-CM | POA: Diagnosis not present

## 2016-06-09 DIAGNOSIS — Z1389 Encounter for screening for other disorder: Secondary | ICD-10-CM | POA: Diagnosis not present

## 2016-06-12 DIAGNOSIS — H9191 Unspecified hearing loss, right ear: Secondary | ICD-10-CM | POA: Diagnosis not present

## 2016-06-12 DIAGNOSIS — H9042 Sensorineural hearing loss, unilateral, left ear, with unrestricted hearing on the contralateral side: Secondary | ICD-10-CM | POA: Diagnosis not present

## 2016-06-15 IMAGING — NM NM MISC PROCEDURE
6 series · 36 of 36 positions shown · non-contrast
Comparison: none

[Series 1: wbr_r-proj_st wbr rest · 6.40mm/px · 6 of 64 frames shown]
[frame 6/64]
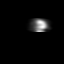
[frame 16/64]
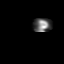
[frame 27/64]
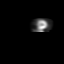
[frame 38/64]
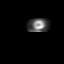
[frame 48/64]
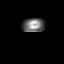
[frame 59/64]
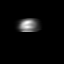

[Series 1: wbr rest · 6.40mm/px · 6 of 64 frames shown]
[frame 6/64]
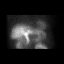
[frame 16/64]
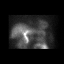
[frame 27/64]
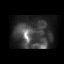
[frame 38/64]
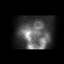
[frame 48/64]
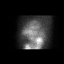
[frame 59/64]
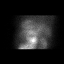

[Series 2: wbr_s-proj_st wbr stress-gsp · 6.40mm/px · 6 of 512 frames shown]
[frame 43/512]
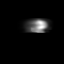
[frame 128/512]
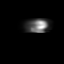
[frame 214/512]
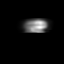
[frame 299/512]
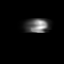
[frame 384/512]
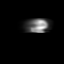
[frame 470/512]
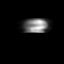

[Series 2: wbr stress-gsp · 6.40mm/px · 6 of 512 frames shown]
[frame 43/512]
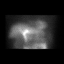
[frame 128/512]
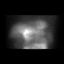
[frame 214/512]
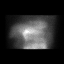
[frame 299/512]
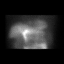
[frame 384/512]
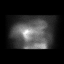
[frame 470/512]
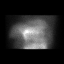

[Series 3: wbr_s-proj_st wbr stress-sum-em · 6.40mm/px · 6 of 64 frames shown]
[frame 6/64]
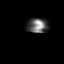
[frame 16/64]
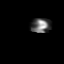
[frame 27/64]
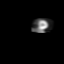
[frame 38/64]
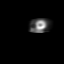
[frame 48/64]
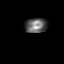
[frame 59/64]
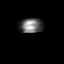

[Series 3: wbr stress-sum-em · 6.40mm/px · 6 of 64 frames shown]
[frame 6/64]
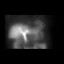
[frame 16/64]
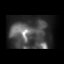
[frame 27/64]
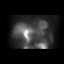
[frame 38/64]
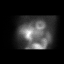
[frame 48/64]
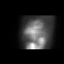
[frame 59/64]
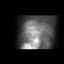

[36 of 36 positions shown; findings below may reference images not displayed]

Canned report from images found in remote index.

Refer to host system for actual result text.

## 2016-07-07 DIAGNOSIS — H40003 Preglaucoma, unspecified, bilateral: Secondary | ICD-10-CM | POA: Diagnosis not present

## 2016-07-08 DIAGNOSIS — M170 Bilateral primary osteoarthritis of knee: Secondary | ICD-10-CM | POA: Diagnosis not present

## 2016-07-08 DIAGNOSIS — Z471 Aftercare following joint replacement surgery: Secondary | ICD-10-CM | POA: Diagnosis not present

## 2016-07-08 DIAGNOSIS — Z96641 Presence of right artificial hip joint: Secondary | ICD-10-CM | POA: Diagnosis not present

## 2016-07-08 DIAGNOSIS — M1712 Unilateral primary osteoarthritis, left knee: Secondary | ICD-10-CM | POA: Diagnosis not present

## 2016-07-21 ENCOUNTER — Other Ambulatory Visit: Payer: Self-pay | Admitting: Cardiovascular Disease

## 2016-07-21 DIAGNOSIS — Z1231 Encounter for screening mammogram for malignant neoplasm of breast: Secondary | ICD-10-CM | POA: Diagnosis not present

## 2016-07-21 MED ORDER — AMLODIPINE BESYLATE 5 MG PO TABS: 5.0000 mg | ORAL_TABLET | Freq: Every day | ORAL | 11 refills | Status: DC

## 2016-07-21 NOTE — Telephone Encounter (Signed)
Rx request sent to pharmacy.  

## 2016-08-07 DIAGNOSIS — H25813 Combined forms of age-related cataract, bilateral: Secondary | ICD-10-CM | POA: Diagnosis not present

## 2016-08-13 DIAGNOSIS — Z23 Encounter for immunization: Secondary | ICD-10-CM | POA: Diagnosis not present

## 2016-09-02 DIAGNOSIS — Z7982 Long term (current) use of aspirin: Secondary | ICD-10-CM | POA: Diagnosis not present

## 2016-09-02 DIAGNOSIS — R42 Dizziness and giddiness: Secondary | ICD-10-CM | POA: Diagnosis not present

## 2016-09-02 DIAGNOSIS — E7800 Pure hypercholesterolemia, unspecified: Secondary | ICD-10-CM | POA: Diagnosis not present

## 2016-09-02 DIAGNOSIS — I10 Essential (primary) hypertension: Secondary | ICD-10-CM | POA: Diagnosis not present

## 2016-09-02 DIAGNOSIS — Z79899 Other long term (current) drug therapy: Secondary | ICD-10-CM | POA: Diagnosis not present

## 2016-09-03 DIAGNOSIS — I639 Cerebral infarction, unspecified: Secondary | ICD-10-CM | POA: Diagnosis not present

## 2016-10-23 DIAGNOSIS — Z1389 Encounter for screening for other disorder: Secondary | ICD-10-CM | POA: Diagnosis not present

## 2016-10-23 DIAGNOSIS — I679 Cerebrovascular disease, unspecified: Secondary | ICD-10-CM | POA: Diagnosis not present

## 2016-12-16 DIAGNOSIS — M1712 Unilateral primary osteoarthritis, left knee: Secondary | ICD-10-CM | POA: Diagnosis not present

## 2016-12-16 DIAGNOSIS — Z471 Aftercare following joint replacement surgery: Secondary | ICD-10-CM | POA: Diagnosis not present

## 2016-12-16 DIAGNOSIS — Z96651 Presence of right artificial knee joint: Secondary | ICD-10-CM | POA: Diagnosis not present

## 2017-01-06 DIAGNOSIS — Z01818 Encounter for other preprocedural examination: Secondary | ICD-10-CM | POA: Diagnosis not present

## 2017-01-06 DIAGNOSIS — H25812 Combined forms of age-related cataract, left eye: Secondary | ICD-10-CM | POA: Diagnosis not present

## 2017-01-06 DIAGNOSIS — H40003 Preglaucoma, unspecified, bilateral: Secondary | ICD-10-CM | POA: Diagnosis not present

## 2017-01-27 DIAGNOSIS — H268 Other specified cataract: Secondary | ICD-10-CM | POA: Diagnosis not present

## 2017-01-27 DIAGNOSIS — H25812 Combined forms of age-related cataract, left eye: Secondary | ICD-10-CM | POA: Diagnosis not present

## 2017-02-09 ENCOUNTER — Ambulatory Visit (INDEPENDENT_AMBULATORY_CARE_PROVIDER_SITE_OTHER): Payer: Medicare Other | Admitting: Cardiovascular Disease

## 2017-02-09 ENCOUNTER — Encounter: Payer: Self-pay | Admitting: Cardiovascular Disease

## 2017-02-09 VITALS — BP 147/70 | HR 64 | Ht 62.0 in | Wt 157.0 lb

## 2017-02-09 DIAGNOSIS — I350 Nonrheumatic aortic (valve) stenosis: Secondary | ICD-10-CM | POA: Diagnosis not present

## 2017-02-09 DIAGNOSIS — I10 Essential (primary) hypertension: Secondary | ICD-10-CM

## 2017-02-09 DIAGNOSIS — G458 Other transient cerebral ischemic attacks and related syndromes: Secondary | ICD-10-CM | POA: Diagnosis not present

## 2017-02-09 DIAGNOSIS — G459 Transient cerebral ischemic attack, unspecified: Secondary | ICD-10-CM

## 2017-02-09 HISTORY — DX: Transient cerebral ischemic attack, unspecified: G45.9

## 2017-02-09 NOTE — Progress Notes (Signed)
Marland KitchenMarland KitchenMarland Kitchen.................................   Cardiology Office Note   Date:  9/44/9675   ID:  EBONE ALCIVAR, DOB [birth year], MRN [id number]  PCP:  Ann Held, MD  Cardiologist:   Skeet Latch, MD   Chief Complaint  Patient presents with  . Follow-up    yearly followup had one chest pain episode     Patient ID: Ashley Ruiz is a 83 y.o. female with hypertension, hyperlipidemia, moderate aortic stenosis, and TIA who presents for follow up.  Ashley Ruiz was initially seen 01/2016 for preoperative risk assessment prior to hip replacement. At the time she was unable to exercise due to hip pain. She was referred for [facility] 02/13/16 that revealed LVEF 73% and no ischemia.  Her blood pressure was poorly-controlled so amlodipine was added to her regimen.  Since her last appointment Ashley Ruiz has been doing well.  In October 2017 she had an episode of feeling off balance while at bible study.  There was no associated chest pain or shortness of breath and she didn't feel dizzy.  There was no weakness or slurred speech.  She was diagnosed with a TIA and aspirin was switched to clopidogrel. She is active but doesn't get much formal exercise due to knee pain.  She does like to do water aerobics.  She checks her BP at home and it has been typically around 127/62.  She underwent hip replacement without complication.   Past Medical History:  Diagnosis Date  . Aortic stenosis    peak/mean pressure gradient 28/16 mmHg by echo at [facility] 12/28/15  . Arthritis    OA AND PAIN BOTH KNEES; SOME PAIN IN RT HIP  . Cancer (HCC)    SKIN CANCER ON LT EAR  . Essential hypertension 02/01/2016  . Heart murmur   . Hip arthritis 02/01/2016  . Hypercholesteremia   . Hyperlipidemia 02/01/2016  . Hypertension   . Moderate aortic stenosis 02/01/2016  . PONV (postoperative nausea and vomiting)   . Stroke [facility])    ?   SMALL TIA , PATIENT HAD 1 EPISODE DOUBLE VISION 12/2014  . TIA (transient  ischemic attack) 02/09/2017    Past Surgical History:  Procedure Laterality Date  . ABDOMINAL HYSTERECTOMY  ? 2005  . CHOLECYSTECTOMY  ? 1982  . TONSILLECTOMY  1943  . TOTAL HIP ARTHROPLASTY Right 03/12/2016   Procedure: RIGHT TOTAL HIP ARTHROPLASTY ANTERIOR APPROACH WITH LEFT KNEE INJECTION;  Surgeon: Gaynelle Arabian, MD;  Location: [facility] ORS;  Service: Orthopedics;  Laterality: Right;  . TOTAL KNEE ARTHROPLASTY Right 01/23/2014   Procedure: RIGHT TOTAL KNEE ARTHROPLASTY;  Surgeon: [name], MD;  Location: [facility] ORS;  Service: Orthopedics;  Laterality: Right;     Current Outpatient Prescriptions  Medication Sig Dispense Refill  . acetaminophen (TYLENOL) 500 MG tablet Take 1,000 mg by mouth every 6 (six) hours as needed for moderate pain.     Marland Kitchen amLODipine (NORVASC) 5 MG tablet Take 1 tablet (5 mg total) by mouth daily. 30 tablet 11  . clopidogrel (PLAVIX) 75 MG tablet Take 75 mg by mouth daily.  12  . latanoprost (XALATAN) 0.005 % ophthalmic solution Place 1 drop into both eyes at bedtime.    Marland Kitchen losartan (COZAAR) 100 MG tablet Take 100 mg by mouth every morning.     . simvastatin (ZOCOR) 10 MG tablet Take 10 mg by mouth at bedtime.     Marland Kitchen spironolactone (ALDACTONE) 25 MG tablet Take 25 mg by mouth every other day.  No current facility-administered medications for this visit.     Allergies:   Biaxin [clarithromycin]    Social History:  The patient  reports that she quit smoking about 31 years ago. Her smoking use included Cigarettes. She has a 2.50 pack-year smoking history. She has never used smokeless tobacco. She reports that she drinks alcohol. She reports that she does not use drugs.   Family History:  The patient's family history includes Cancer in her brother; Heart attack in her father; Other in her mother.    ROS:  Please see the history of present illness.   Otherwise, review of systems are positive for none.   All other systems are reviewed and negative.    PHYSICAL  EXAM: VS:  BP (!) 147/70   Pulse 64   Ht 5\' 2"  (1.575 m)   Wt 71.2 kg (157 lb)   BMI 28.72 kg/m  , BMI Body mass index is 28.72 kg/m. GENERAL:  Well appearing HEENT:  Pupils equal round and reactive, fundi not visualized, oral mucosa unremarkable NECK:  No jugular venous distention, waveform within normal limits, carotid upstroke brisk and symmetric.  Radiation of AS murmur to bilateral carotids. LYMPHATICS:  No cervical adenopathy LUNGS:  Clear to auscultation bilaterally HEART:  RRR.  PMI not displaced or sustained,S1 and S2 within normal limits, no S3, no S4, no clicks, no rubs, III/VI mid-peaking systolic murmur at the LUSB. ABD:  Flat, positive bowel sounds normal in frequency in pitch, no bruits, no rebound, no guarding, no midline pulsatile mass, no hepatomegaly, no splenomegaly EXT:  2 plus pulses throughout, no edema, no cyanosis no clubbing SKIN:  No rashes no nodules NEURO:  Cranial nerves II through XII grossly intact, motor grossly intact throughout PSYCH:  Cognitively intact, oriented to person place and time   EKG:  EKG is ordered today. 02/09/17: Sinus rhythm.  Rate 62 bpm.   Echo 12/28/15 Oval Linsey):  1. Moderate calcific aortic stenosis, and mild aortic regurgitation; possibly bicuspid valve.  2. Mild concentric LVH with normal systolic function. EF 65-70% 3. Mild mitral regurgitation with mild LA dilatation 4. Normal R heart size/function with mild TR, borderline/mild elevation of pulmonary artery pressure 5. Incidentally noted PVCs  Exercise treadmill stress test 11/09/13 Oval Linsey):  1. Poor exercise tolerance 2. Good workload achieved 3. EKG showed changes that are not diagnostic for ischemia.  4. No chest pain during the exercise   Recent Labs: 03/05/2016: ALT 15 03/13/2016: BUN 12; Creatinine, Ser 0.65; Hemoglobin 10.8; Platelets 155; Potassium 3.7; Sodium 140    Lipid Panel No results found for: CHOL, TRIG, HDL, CHOLHDL, VLDL, LDLCALC, LDLDIRECT     Wt Readings from Last 3 Encounters:  02/09/17 71.2 kg (157 lb)  03/12/16 66 kg (145 lb 9 oz)  03/05/16 66 kg (145 lb 9.6 oz)      ASSESSMENT AND PLAN:  # Moderate aortic stenosis: Stable.  Repeat echo 01/2018.  # Hypertension: Ashley Ruiz blood pressure is above goal.  She reports that it has been well-controlled at home.  She will keep a log and bring the log and her machine to a pharmacy appointment in 2 weeks. Continue amlodipine and losartan for now.  # TIA: Ashley Ruiz  had a TIA 01/2016.  Clopidogrel was restarted.  Continue simvastatin and obtain records from [facility]. She reports that a full work up occurred.   Current medicines are reviewed at length with the patient today.  The patient does not have concerns regarding medicines.  The  following changes have been made:  no change  Labs/ tests ordered today include:   Orders Placed This Encounter  Procedures  . EKG 12-Lead     Disposition:   FU with Breleigh Carpino C. Oval Linsey, MD, [facility] in 1 year.  This note was written with the assistance of speech recognition software.  Please excuse any transcriptional errors.  Signed, Bartholomew Ramesh C. Oval Linsey, MD, [facility]  02/09/2017 9:08 AM    [place]

## 2017-02-09 NOTE — Patient Instructions (Addendum)
Medication Instructions:  Your physician recommends that you continue on your current medications as directed. Please refer to the Current Medication list given to you today.  Labwork: None   Testing/Procedures: None   Follow-Up: Your physician recommends that you schedule a follow-up appointment in: 2 Weeks with Hypertension clinic  Your physician wants you to follow-up in: 12 months with Dr Oval Linsey. You will receive a reminder letter in the mail two months in advance. If you don't receive a letter, please call our office to schedule the follow-up appointment.   Any Other Special Instructions Will Be Listed Below (If Applicable).  Record your blood pressure for 2 weeks and bring readings with you to your next visit with the pharmacist.  If you need a refill on your cardiac medications before your next appointment, please call your pharmacy.

## 2017-02-11 DIAGNOSIS — E782 Mixed hyperlipidemia: Secondary | ICD-10-CM | POA: Diagnosis not present

## 2017-02-11 DIAGNOSIS — I679 Cerebrovascular disease, unspecified: Secondary | ICD-10-CM | POA: Diagnosis not present

## 2017-02-11 DIAGNOSIS — I10 Essential (primary) hypertension: Secondary | ICD-10-CM | POA: Diagnosis not present

## 2017-02-11 DIAGNOSIS — Z79899 Other long term (current) drug therapy: Secondary | ICD-10-CM | POA: Diagnosis not present

## 2017-02-11 DIAGNOSIS — I350 Nonrheumatic aortic (valve) stenosis: Secondary | ICD-10-CM | POA: Diagnosis not present

## 2017-02-11 DIAGNOSIS — M8580 Other specified disorders of bone density and structure, unspecified site: Secondary | ICD-10-CM | POA: Diagnosis not present

## 2017-02-11 DIAGNOSIS — I340 Nonrheumatic mitral (valve) insufficiency: Secondary | ICD-10-CM | POA: Diagnosis not present

## 2017-02-18 ENCOUNTER — Telehealth: Payer: Self-pay | Admitting: Cardiovascular Disease

## 2017-02-18 NOTE — Telephone Encounter (Signed)
Received records from [facility] as requested by Dr Pun[place] Rassa. Records given to Dr Maverick for review. lp °

## 2017-02-25 ENCOUNTER — Ambulatory Visit (HOSPITAL_COMMUNITY)
Admission: RE | Admit: 2017-02-25 | Discharge: 2017-02-25 | Disposition: A | Discharge status: Home / Self Care | Payer: Medicare Other | Source: Ambulatory Visit | Attending: Cardiology | Admitting: Cardiology

## 2017-02-25 ENCOUNTER — Ambulatory Visit (INDEPENDENT_AMBULATORY_CARE_PROVIDER_SITE_OTHER): Payer: Medicare Other | Admitting: Pharmacist

## 2017-02-25 VITALS — BP 140/68 | HR 60

## 2017-02-25 DIAGNOSIS — Z87891 Personal history of nicotine dependence: Secondary | ICD-10-CM | POA: Diagnosis not present

## 2017-02-25 DIAGNOSIS — I6523 Occlusion and stenosis of bilateral carotid arteries: Secondary | ICD-10-CM | POA: Insufficient documentation

## 2017-02-25 DIAGNOSIS — I10 Essential (primary) hypertension: Secondary | ICD-10-CM | POA: Diagnosis not present

## 2017-02-25 DIAGNOSIS — Z8673 Personal history of transient ischemic attack (TIA), and cerebral infarction without residual deficits: Secondary | ICD-10-CM | POA: Insufficient documentation

## 2017-02-25 DIAGNOSIS — E785 Hyperlipidemia, unspecified: Secondary | ICD-10-CM | POA: Diagnosis not present

## 2017-02-25 NOTE — Progress Notes (Signed)
Patient ID: Ashley Ruiz                 DOB: March 08, 1929                      MRN: [id number]     HPI:  WARRENE Ruiz is a 83 y.o. female referred by Dr. Oval Linsey to HTN clinic. PMH includes hypertension, hyperlipidemia, moderate aortic stenosis, and TIA. Patient BP slightly elevated during most recent office visit but home readings remains appropriate per patient statement.  Presents today for HTN evaluation and possible therapy adjustment.  Patient denies dizziness, headaches, fatigue or swelling.  Compliant with current therapy and tolerated current medication regimen well.   Current HTN meds:  Amlodipine 5mg  daily Losartan 100mg  daily Spironolactone 25mg  every other day   BP goal: 130/80  Family History: The patient's family history includes Cancer in her brother; Heart attack in her father; Other in her mother.   Social History:  The patient  reports that she quit smoking about 31 years ago. Her smoking use included Cigarettes. She has a 2.50 pack-year smoking history. She has never used smokeless tobacco. She reports that she drinks alcohol. She reports that she does not use drugs.   Diet: 1 cup of regular coffee every morning, little salt on food, no added table salt  Exercise: very active, some walking at home; swims every day in the summer  Home BP readings:  LEADER  (<48 year old) accurate with 10 mmHg.19 readings total (16 evening reading ; 3 morning readings):  morning average: 136/65 (range 130-147/62-70) Afternoon average: 127/58 (range 115-136/52-71)  Wt Readings from Last 3 Encounters:  02/09/17 157 lb (71.2 kg)  03/12/16 145 lb 9 oz (66 kg)  03/05/16 145 lb 9.6 oz (66 kg)   BP Readings from Last 3 Encounters:  02/25/17 140/68  02/09/17 (!) 147/70  03/13/16 (!) 123/36   Pulse Readings from Last 3 Encounters:  02/25/17 60  02/09/17 64  03/13/16 79    Past Medical History:  Diagnosis Date  . Aortic stenosis    peak/mean pressure gradient 28/16 mmHg by  echo at [facility] 12/28/15  . Arthritis    OA AND PAIN BOTH KNEES; SOME PAIN IN RT HIP  . Cancer (HCC)    SKIN CANCER ON LT EAR  . Essential hypertension 02/01/2016  . Heart murmur   . Hip arthritis 02/01/2016  . Hypercholesteremia   . Hyperlipidemia 02/01/2016  . Hypertension   . Moderate aortic stenosis 02/01/2016  . PONV (postoperative nausea and vomiting)   . Stroke [facility])    ?   SMALL TIA , PATIENT HAD 1 EPISODE DOUBLE VISION 12/2014  . TIA (transient ischemic attack) 02/09/2017    Current Outpatient Prescriptions on File Prior to Visit  Medication Sig Dispense Refill  . amLODipine (NORVASC) 5 MG tablet Take 1 tablet (5 mg total) by mouth daily. 30 tablet 11  . clopidogrel (PLAVIX) 75 MG tablet Take 75 mg by mouth daily.  12  . latanoprost (XALATAN) 0.005 % ophthalmic solution Place 1 drop into both eyes at bedtime.    Marland Kitchen losartan (COZAAR) 100 MG tablet Take 100 mg by mouth every morning.     . simvastatin (ZOCOR) 10 MG tablet Take 10 mg by mouth at bedtime.     Marland Kitchen spironolactone (ALDACTONE) 25 MG tablet Take 25 mg by mouth every other day.    Marland Kitchen acetaminophen (TYLENOL) 500 MG tablet Take 1,000 mg by mouth  every 6 (six) hours as needed for moderate pain.      No current facility-administered medications on file prior to visit.     Allergies  Allergen Reactions  . Biaxin [Clarithromycin] Nausea And Vomiting    Blood pressure 140/68, pulse 60, SpO2 98 %.  Essential hypertension: Blood pressure today is slightly above goal of 130/80 but home readings show blood pressure property controlled.  Patient may have some degree of white coat syndrome, but also noted her morning BP readings are higher than the evening readings.  Will continue losartan 100mg  every morning, spironolactone 25mg  every other day in the mornings and change amlodipine 5mg  every evening.  Patient BP home device calibrated today and determined to be accurate.  Patient instructed to keep record of home BP reading to  bring to next office visit in 5 weeks.    Kairi Harshbarger Rodriguez-Guzman PharmD, [place] [address] 02/25/2017 10:10 AM

## 2017-02-25 NOTE — Patient Instructions (Addendum)
Return for a  follow up appointment 5 weeks  Your blood pressure today is 140/68 pulse 60  Check your blood pressure at home daily (if able) and keep record of the readings.  Take your BP meds as follows: Amlodipine 5mg  daily every evening  Losartan 100mg  daily every morning Spironolactone 25mg  every other day in the morning  Bring all of your meds, your BP cuff and your record of home blood pressures to your next appointment.  Exercise as you're able, try to walk approximately 30 minutes per day.  Keep salt intake to a minimum, especially watch canned and prepared boxed foods.  Eat more fresh fruits and vegetables and fewer canned items.  Avoid eating in fast food restaurants.    HOW TO TAKE YOUR BLOOD PRESSURE: . Rest 5 minutes before taking your blood pressure. .  Don't smoke or drink caffeinated beverages for at least 30 minutes before. . Take your blood pressure before (not after) you eat. . Sit comfortably with your back supported and both feet on the floor (don't cross your legs). . Elevate your arm to heart level on a table or a desk. . Use the proper sized cuff. It should fit smoothly and snugly around your bare upper arm. There should be enough room to slip a fingertip under the cuff. The bottom edge of the cuff should be 1 inch above the crease of the elbow. . Ideally, take 3 measurements at one sitting and record the average.

## 2017-03-11 ENCOUNTER — Encounter (HOSPITAL_COMMUNITY): Payer: Self-pay

## 2017-03-17 ENCOUNTER — Ambulatory Visit (INDEPENDENT_AMBULATORY_CARE_PROVIDER_SITE_OTHER): Payer: Medicare Other | Admitting: Pharmacist Clinician (PhC)/ Clinical Pharmacy Specialist

## 2017-03-17 DIAGNOSIS — I10 Essential (primary) hypertension: Secondary | ICD-10-CM | POA: Diagnosis not present

## 2017-03-17 NOTE — Progress Notes (Signed)
Patient ID: Ashley Ruiz                 DOB: 1929/09/07                      MRN: [id number]     HPI:  Ashley Ruiz is a 83 y.o. female referred by Dr. Oval Linsey to HTN clinic. Today she is here for a follow up appointment.  PMH includes hypertension, hyperlipidemia, moderate aortic stenosis, and TIA.  She denies dizziness, headaches, fatigue or swelling.  Compliant with current therapy and tolerated current medication regimen well.   Current HTN meds:  Amlodipine 5mg  daily Losartan 100mg  daily Spironolactone 25mg  every other day   BP goal: 130/80  Family History: The patient's family history includes Cancer in her brother; Heart attack in her father (11);   Social History:  The patient  reports that she quit smoking about 31 years ago. Her smoking use included Cigarettes. She has a 2.50 pack-year smoking history. She has never used smokeless tobacco. She reports that she drinks alcohol. She reports that she does not use drugs.   Diet: 1 cup of regular coffee every morning, little salt on food, no added table salt; not a lot of fish, but otherwise chicken, beef, pork; only occasional canned foods  Exercise: very active, some walking at home; swims every day in the summer  Home BP readings:  LEADER  (<3 year old) accurate with 10 mmHg.  Only took 5 readings over past 2 weeks, with average of 127/61 and range of 115-141/55-64  Wt Readings from Last 3 Encounters:  02/09/17 157 lb (71.2 kg)  03/12/16 145 lb 9 oz (66 kg)  03/05/16 145 lb 9.6 oz (66 kg)   BP Readings from Last 3 Encounters:  03/17/17 (!) 126/58  02/25/17 140/68  02/09/17 (!) 147/70   Pulse Readings from Last 3 Encounters:  03/17/17 (!) 58  02/25/17 60  02/09/17 64    Past Medical History:  Diagnosis Date  . Aortic stenosis    peak/mean pressure gradient 28/16 mmHg by echo at [facility] 12/28/15  . Arthritis    OA AND PAIN BOTH KNEES; SOME PAIN IN RT HIP  . Cancer (HCC)    SKIN CANCER ON LT EAR  .  Essential hypertension 02/01/2016  . Heart murmur   . Hip arthritis 02/01/2016  . Hypercholesteremia   . Hyperlipidemia 02/01/2016  . Hypertension   . Moderate aortic stenosis 02/01/2016  . PONV (postoperative nausea and vomiting)   . Stroke [facility])    ?   SMALL TIA , PATIENT HAD 1 EPISODE DOUBLE VISION 12/2014  . TIA (transient ischemic attack) 02/09/2017    Current Outpatient Prescriptions on File Prior to Visit  Medication Sig Dispense Refill  . acetaminophen (TYLENOL) 500 MG tablet Take 1,000 mg by mouth every 6 (six) hours as needed for moderate pain.     Marland Kitchen amLODipine (NORVASC) 5 MG tablet Take 1 tablet (5 mg total) by mouth daily. 30 tablet 11  . clopidogrel (PLAVIX) 75 MG tablet Take 75 mg by mouth daily.  12  . latanoprost (XALATAN) 0.005 % ophthalmic solution Place 1 drop into both eyes at bedtime.    Marland Kitchen losartan (COZAAR) 100 MG tablet Take 100 mg by mouth every morning.     . simvastatin (ZOCOR) 10 MG tablet Take 10 mg by mouth at bedtime.     Marland Kitchen spironolactone (ALDACTONE) 25 MG tablet Take 25 mg by mouth every  other day.     No current facility-administered medications on file prior to visit.     Allergies  Allergen Reactions  . Biaxin [Clarithromycin] Nausea And Vomiting    Blood pressure (!) 126/58, pulse (!) 58.  Essential hypertension: Blood pressure looks well controlled at this time.  She has no complaints and states she is feeling great for being 83.  I encouraged her to continue with checking her BP at home 3-4 times each week, and let us know should it start to climb higher than 626 systolic.      Tommy Medal, PharmD CPP [facility],  [facility] [place] [address] 03/17/2017 5:06 PM

## 2017-03-17 NOTE — Patient Instructions (Signed)
   Your blood pressure today is 126/58  Check your blood pressure at home a few times each week and keep record of the readings.  Take your BP meds as follows:   Continue with all current medications  Bring all of your meds, your BP cuff and your record of home blood pressures to your next appointment.  Exercise as you're able, try to walk approximately 30 minutes per day.  Keep salt intake to a minimum, especially watch canned and prepared boxed foods.  Eat more fresh fruits and vegetables and fewer canned items.  Avoid eating in fast food restaurants.    HOW TO TAKE YOUR BLOOD PRESSURE: . Rest 5 minutes before taking your blood pressure. .  Don't smoke or drink caffeinated beverages for at least 30 minutes before. . Take your blood pressure before (not after) you eat. . Sit comfortably with your back supported and both feet on the floor (don't cross your legs). . Elevate your arm to heart level on a table or a desk. . Use the proper sized cuff. It should fit smoothly and snugly around your bare upper arm. There should be enough room to slip a fingertip under the cuff. The bottom edge of the cuff should be 1 inch above the crease of the elbow. . Ideally, take 3 measurements at one sitting and record the average.

## 2017-03-17 NOTE — Assessment & Plan Note (Addendum)
Blood pressure looks well controlled at this time.  She has no complaints and states she is feeling great for being 87.  I encouraged her to continue with checking her BP at home 3-4 times each week, and let us know should it start to climb higher than 005 systolic.

## 2017-04-01 ENCOUNTER — Ambulatory Visit: Payer: Medicare Other

## 2017-04-29 DIAGNOSIS — M1712 Unilateral primary osteoarthritis, left knee: Secondary | ICD-10-CM | POA: Diagnosis not present

## 2017-07-30 ENCOUNTER — Other Ambulatory Visit: Payer: Self-pay | Admitting: *Deleted

## 2017-07-30 MED ORDER — AMLODIPINE BESYLATE 5 MG PO TABS: 5.0000 mg | ORAL_TABLET | Freq: Every day | ORAL | 7 refills | Status: DC

## 2017-07-30 MED ORDER — AMLODIPINE BESYLATE 5 MG PO TABS: 5.0000 mg | ORAL_TABLET | Freq: Every day | ORAL | 7 refills | Status: AC

## 2017-08-10 DIAGNOSIS — L0390 Cellulitis, unspecified: Secondary | ICD-10-CM | POA: Diagnosis not present

## 2017-08-10 DIAGNOSIS — M25562 Pain in left knee: Secondary | ICD-10-CM | POA: Diagnosis not present

## 2017-08-11 DIAGNOSIS — M1712 Unilateral primary osteoarthritis, left knee: Secondary | ICD-10-CM | POA: Diagnosis not present

## 2017-08-11 DIAGNOSIS — S8992XA Unspecified injury of left lower leg, initial encounter: Secondary | ICD-10-CM | POA: Diagnosis not present

## 2017-08-11 DIAGNOSIS — M25562 Pain in left knee: Secondary | ICD-10-CM | POA: Diagnosis not present

## 2017-08-12 DIAGNOSIS — M069 Rheumatoid arthritis, unspecified: Secondary | ICD-10-CM | POA: Diagnosis not present

## 2017-08-12 DIAGNOSIS — Z87891 Personal history of nicotine dependence: Secondary | ICD-10-CM | POA: Diagnosis not present

## 2017-08-12 DIAGNOSIS — M810 Age-related osteoporosis without current pathological fracture: Secondary | ICD-10-CM | POA: Diagnosis not present

## 2017-08-12 DIAGNOSIS — I10 Essential (primary) hypertension: Secondary | ICD-10-CM | POA: Diagnosis not present

## 2017-08-12 DIAGNOSIS — S81012A Laceration without foreign body, left knee, initial encounter: Secondary | ICD-10-CM | POA: Diagnosis not present

## 2017-08-12 DIAGNOSIS — I2510 Atherosclerotic heart disease of native coronary artery without angina pectoris: Secondary | ICD-10-CM | POA: Diagnosis not present

## 2017-08-18 DIAGNOSIS — Z09 Encounter for follow-up examination after completed treatment for conditions other than malignant neoplasm: Secondary | ICD-10-CM | POA: Diagnosis not present

## 2017-08-18 DIAGNOSIS — S81002A Unspecified open wound, left knee, initial encounter: Secondary | ICD-10-CM | POA: Diagnosis not present

## 2017-08-22 DIAGNOSIS — Z23 Encounter for immunization: Secondary | ICD-10-CM | POA: Diagnosis not present

## 2017-08-25 DIAGNOSIS — H40003 Preglaucoma, unspecified, bilateral: Secondary | ICD-10-CM | POA: Diagnosis not present

## 2017-08-26 DIAGNOSIS — K529 Noninfective gastroenteritis and colitis, unspecified: Secondary | ICD-10-CM | POA: Diagnosis not present

## 2017-08-26 DIAGNOSIS — Z6828 Body mass index (BMI) 28.0-28.9, adult: Secondary | ICD-10-CM | POA: Diagnosis not present

## 2017-08-26 DIAGNOSIS — M546 Pain in thoracic spine: Secondary | ICD-10-CM | POA: Diagnosis not present

## 2017-09-17 DIAGNOSIS — M1712 Unilateral primary osteoarthritis, left knee: Secondary | ICD-10-CM | POA: Diagnosis not present

## 2017-09-24 DIAGNOSIS — M25512 Pain in left shoulder: Secondary | ICD-10-CM | POA: Diagnosis not present

## 2018-01-14 DIAGNOSIS — J069 Acute upper respiratory infection, unspecified: Secondary | ICD-10-CM | POA: Diagnosis not present

## 2018-02-02 DIAGNOSIS — L820 Inflamed seborrheic keratosis: Secondary | ICD-10-CM | POA: Diagnosis not present

## 2018-02-09 ENCOUNTER — Ambulatory Visit: Payer: Medicare Other | Admitting: Cardiovascular Disease

## 2018-03-04 DIAGNOSIS — M1712 Unilateral primary osteoarthritis, left knee: Secondary | ICD-10-CM | POA: Diagnosis not present

## 2018-03-04 NOTE — Progress Notes (Signed)
Cardiology Office Note:    Date:  7/82/9562   ID:  Ashley Ruiz, DOB [birth year], MRN [id number]  PCP:  Myer Peer, MD  Cardiologist:  Shirlee More, MD    Referring MD: Myer Peer, MD    ASSESSMENT:    1. Moderate aortic stenosis   2. Essential hypertension   3. Hyperlipidemia, unspecified hyperlipidemia type    PLAN:    In order of problems listed above:  1. Asymptomatic but at risk of progression she will undergo repeat echocardiogram in July and office and [place] and if she develops symptomatic severe aortic stenosis would benefit from intervention particularly TAVR.  Fortunately she no longer has her own teeth and does not require endocarditis prophylaxis. 2. Stable continue current treatment ARB calcium channel blocker diuretic.  Recent labs requested from PCP office 3. Stable continue her statin   Next appointment: 6 months   Medication Adjustments/Labs and Tests Ordered: Current medicines are reviewed at length with the patient today.  Concerns regarding medicines are outlined above.  No orders of the defined types were placed in this encounter.  No orders of the defined types were placed in this encounter.   Chief Complaint  Patient presents with  . Follow-up  . Aortic Stenosis  . Hypertension    History of Present Illness:    Ashley Ruiz is a 84 y.o. female with a hx of hypertension and moderate aortic stenosis last seen 1 year ago.  84/13/24 Ashley Ruiz is a 84 y.o. female with hypertension, hyperlipidemia, moderate aortic stenosis, and TIA who presents for follow up.  Ashley Ruiz was initially seen 01/2016 for preoperative risk assessment prior to hip replacement. At the time she was unable to exercise due to hip pain. She was referred for [facility] 02/13/16 that revealed LVEF 73% and no ischemia.  Her blood pressure was poorly-controlled so amlodipine was added to her regimen.  Since her last appointment Ashley Ruiz has been  doing well.  In October 2017 she had an episode of feeling off balance while at bible study.  There was no associated chest pain or shortness of breath and she didn't feel dizzy.  There was no weakness or slurred speech.  She was diagnosed with a TIA and aspirin was switched to clopidogrel. She is active but doesn't get much formal exercise due to knee pain.  She does like to do water aerobics.  She checks her BP at home and it has been typically around 127/62.  She underwent hip replacement without complication. ASSESSMENT AND PLAN: # Moderate aortic stenosis: Stable.  Repeat echo 01/2018. # Hypertension: Ashley Ruiz's blood pressure is above goal.  She reports that it has been well-controlled at home.  She will keep a log and bring the log and her machine to a pharmacy appointment in 2 weeks. Continue amlodipine and losartan for now. # TIA: Ashley Ruiz  had a TIA 01/2016.  Clopidogrel was restarted.  Continue simvastatin and obtain records from [facility]. She reports that a full work up occurred. :03/12/18: Study Highlights   Nuclear stress EF: 73%.  There was no ST segment deviation noted during stress.  The study is normal.  This is a low risk study. Low risk stress nuclear study with normal perfusion and normal left ventricular regional and global systolic function.   Compliance with diet, lifestyle and medications: yes  Despite knee pain she remains quite vigorous active and has had no chest pain significant shortness of breath or  syncope.  She said when she works outside and does extensive activity she finds herself mildly breathless unchanged from 1-2 years ago.  No edema palpitation orthopnea. Past Medical History:  Diagnosis Date  . Aortic stenosis    peak/mean pressure gradient 28/16 mmHg by echo at [facility] 12/28/15  . Arthritis    OA AND PAIN BOTH KNEES; SOME PAIN IN RT HIP  . Cancer (HCC)    SKIN CANCER ON LT EAR  . Essential hypertension 02/01/2016  . Heart  murmur   . Hip arthritis 02/01/2016  . Hypercholesteremia   . Hyperlipidemia 02/01/2016  . Hypertension   . Moderate aortic stenosis 02/01/2016  . PONV (postoperative nausea and vomiting)   . Stroke [facility])    ?   SMALL TIA , PATIENT HAD 1 EPISODE DOUBLE VISION 12/2014  . TIA (transient ischemic attack) 02/09/2017    Past Surgical History:  Procedure Laterality Date  . ABDOMINAL HYSTERECTOMY  ? 2005  . CHOLECYSTECTOMY  ? 1982  . TONSILLECTOMY  1943  . TOTAL HIP ARTHROPLASTY Right 03/12/2016   Procedure: RIGHT TOTAL HIP ARTHROPLASTY ANTERIOR APPROACH WITH LEFT KNEE INJECTION;  Surgeon: Gaynelle Arabian, MD;  Location: [facility];  Service: Orthopedics;  Laterality: Right;  . TOTAL KNEE ARTHROPLASTY Right 01/23/2014   Procedure: RIGHT TOTAL KNEE ARTHROPLASTY;  Surgeon: [name], MD;  Location: [facility];  Service: Orthopedics;  Laterality: Right;    Current Medications: Current Meds  Medication Sig  . amLODipine (NORVASC) 5 MG tablet Take 1 tablet (5 mg total) by mouth daily.  . Biotin 10000 MCG TABS Take 1 tablet by mouth daily.  . Cholecalciferol (VITAMIN D3) 400 units tablet Take 400 Units by mouth daily.  . clopidogrel (PLAVIX) 75 MG tablet Take 75 mg by mouth daily.  . Coenzyme Q10 (CO Q 10) 100 MG CAPS Take 1 capsule by mouth daily.  Marland Kitchen ibuprofen (ADVIL,MOTRIN) 200 MG tablet Take 200 mg by mouth every 6 (six) hours as needed for mild pain.  Marland Kitchen latanoprost (XALATAN) 0.005 % ophthalmic solution Place 1 drop into the right eye at bedtime.   Marland Kitchen losartan (COZAAR) 100 MG tablet Take 100 mg by mouth every morning.   . simvastatin (ZOCOR) 10 MG tablet Take 10 mg by mouth at bedtime.      Allergies:   Biaxin [clarithromycin]   Social History   Socioeconomic History  . Marital status: Married    Spouse name: Not on file  . Number of children: Not on file  . Years of education: Not on file  . Highest education level: Not on file  Occupational History  . Not on file  Social Needs  .  Financial resource strain: Not on file  . Food insecurity:    Worry: Not on file    Inability: Not on file  . Transportation needs:    Medical: Not on file    Non-medical: Not on file  Tobacco Use  . Smoking status: Former Smoker    Packs/day: 0.25    Years: 10.00    Pack years: 2.50    Types: Cigarettes    Last attempt to quit: 04/12/1985    Years since quitting: 32.9  . Smokeless tobacco: Never Used  Substance and Sexual Activity  . Alcohol use: Yes    Comment: QUIT SMOKING 30 YRS AGO  RARE GLASS WINE  . Drug use: No  . Sexual activity: Not on file  Lifestyle  . Physical activity:    Days per week: Not  on file    Minutes per session: Not on file  . Stress: Not on file  Relationships  . Social connections:    Talks on phone: Not on file    Gets together: Not on file    Attends religious service: Not on file    Active member of club or organization: Not on file    Attends meetings of clubs or organizations: Not on file    Relationship status: Not on file  Other Topics Concern  . Not on file  Social History Narrative  . Not on file     Family History: The patient's family history includes Cancer in her brother; Heart attack in her father; Other in her mother. ROS:   Please see the history of present illness.    All other systems reviewed and are negative.  EKGs/Labs/Other Studies Reviewed:    The following studies were reviewed today:  EKG:  EKG ordered today.  The ekg ordered today demonstrates [place]in LAE PVC's- 2   Recent Labs: No results found for requested labs within last 8760 hours.  Recent Lipid Panel No results found for: CHOL, TRIG, HDL, CHOLHDL, VLDL, LDLCALC, LDLDIRECT  Physical Exam:    VS:  BP 114/70 (BP Location: Right Arm, Patient Position: Sitting, Cuff Size: Normal)   Pulse 89   Ht 5\' 2"  (1.575 m)   Wt 162 lb (73.5 kg)   SpO2 97%   BMI 29.63 kg/m     Wt Readings from Last 3 Encounters:  03/05/18 162 lb (73.5 kg)  02/09/17 157 lb (71.2  kg)  03/12/16 145 lb 9 oz (66 kg)     GEN:  Well nourished, well developed in no acute distress HEENT: Normal NECK: No JVD; No carotid bruits LYMPHATICS: No lymphadenopathy CARDIAC: 3/6 SEM mid peak no AR s2 single full carotids and faint radiation RESPIRATORY:  Clear to auscultation without rales, wheezing or rhonchi  ABDOMEN: Soft, non-tender, non-distended MUSCULOSKELETAL:  No edema; No deformity  SKIN: Warm and dry NEUROLOGIC:  Alert and oriented x 3 PSYCHIATRIC:  Normal affect    Signed, Shirlee More, MD  03/05/2018 10:40 AM    [place]

## 2018-03-05 ENCOUNTER — Encounter: Payer: Self-pay | Admitting: Cardiology

## 2018-03-05 ENCOUNTER — Other Ambulatory Visit (INDEPENDENT_AMBULATORY_CARE_PROVIDER_SITE_OTHER): Payer: Medicare Other

## 2018-03-05 ENCOUNTER — Ambulatory Visit (INDEPENDENT_AMBULATORY_CARE_PROVIDER_SITE_OTHER): Payer: Medicare Other | Admitting: Cardiology

## 2018-03-05 VITALS — BP 114/70 | HR 89 | Ht 62.0 in | Wt 162.0 lb

## 2018-03-05 DIAGNOSIS — G459 Transient cerebral ischemic attack, unspecified: Secondary | ICD-10-CM

## 2018-03-05 DIAGNOSIS — I350 Nonrheumatic aortic (valve) stenosis: Secondary | ICD-10-CM | POA: Diagnosis not present

## 2018-03-05 DIAGNOSIS — E785 Hyperlipidemia, unspecified: Secondary | ICD-10-CM | POA: Diagnosis not present

## 2018-03-05 DIAGNOSIS — I10 Essential (primary) hypertension: Secondary | ICD-10-CM

## 2018-03-05 NOTE — Patient Instructions (Addendum)
Medication Instructions:  Your physician recommends that you continue on your current medications as directed. Please refer to the Current Medication list given to you today.  Labwork: None  Testing/Procedures: Your physician has requested that you have an echocardiogram. Echocardiography is a painless test that uses sound waves to create images of your heart. It provides your doctor with information about the size and shape of your heart and how well your heart's chambers and valves are working. This procedure takes approximately one hour. There are no restrictions for this procedure. You will be scheduled for this in July in [place].  Follow-Up: Your physician wants you to follow-up in: 6 months. You will receive a reminder letter in the mail two months in advance. If you don't receive a letter, please call our office to schedule the follow-up appointment.  Any Other Special Instructions Will Be Listed Below (If Applicable).     If you need a refill on your cardiac medications before your next appointment, please call your pharmacy.  aor ste Aortic Valve Stenosis Aortic valve stenosis is a narrowing of the aortic valve. The aortic valve opens and closes to regulate blood flow between the lower left chamber of the heart (left ventricle) and the blood vessel that leads away from the heart (aorta). When the aortic valve becomes narrow, it makes it difficult for the heart to pump blood into the aorta, which causes the heart to work harder. The extra work can weaken the heart over time. Aortic valve stenosis can range from mild to severe. If untreated, it can become more severe over time and can lead to heart failure. What are the causes? This condition may be caused by:  Buildup of calcium around and on the valve. This can occur with aging. This is the most common cause of aortic valve stenosis.  Birth defect.  Rheumatic fever.  Radiation to the chest.  What increases the risk? You  may be more likely to develop this condition if:  You are over the age of 30.  You were born with an abnormal bicuspid valve.  What are the signs or symptoms? You may have no symptoms until your condition becomes severe. It may take 10-20 years for mild or moderate aortic valve stenosis to become severe. Symptoms may include:  Shortness of breath. This may get worse during physical activity.  Feeling unusually weak and tired (fatigue).  Extreme discomfort in the chest, neck, or arm (angina).  A heartbeat that is irregular or faster than normal (palpitations).  Dizziness or fainting. This may happen when you get physically tired or after you take certain heart medicines, such as nitroglycerin.  How is this diagnosed? This condition may be diagnosed with:  A physical exam.  Echocardiogram. This is a type of imaging test that uses sound waves (ultrasound) to make an image of your heart. There are two types that may be used: ? Transthoracic echocardiogram (TTE). This type of echocardiogram is noninvasive, and it is usually done first. ? Transesophageal echocardiogram (TEE). This type of echocardiogram is done by passing a flexible tube down your esophagus. The heart and the esophagus are close to each other, so your health care provider can take very clear, detailed pictures of the heart using this type of test.  Cardiac catheterization. In this procedure, a thin, flexible tube (catheter) is passed through a large vein in your neck, groin, or arm. This procedure provides information about arteries, structures, blood pressure, and oxygen levels in your heart.  Electrocardiogram (  ECG). This records the electrical impulses of your heart and assesses heart function.  Stress tests. These are tests that evaluate the blood supply to your heart and your heart's response to exercise.  Blood tests.  You may work with a health care provider who specializes in the heart (cardiologist). How is  this treated? Treatment depends on how severe your condition is and what your symptoms are. You will need to have your heart checked regularly to make sure that your condition is not getting worse or causing serious problems. If your condition is mild, no treatment may be needed. Treatment may include:  Medicines that help keep your heart rate regular.  Medicines that thin your blood (anticoagulants) to prevent the formation of blood clots.  Antibiotic medicines to help prevent infection.  Surgery to replace your aortic valve. This is the most common treatment for aortic valve stenosis. Several types of surgeries are available. The surgery may be done through a large incision over your heart (open heart surgery), or it may be done using a minimally invasive technique (transcatheter aortic valve replacement, or TAVR).  Follow these instructions at home: Lifestyle   Limit alcohol intake to no more than 1 drink per day for nonpregnant women and 2 drinks per day for men. One drink equals 12 oz of beer, 5 oz of wine, or 1 oz of hard liquor.  Do not use any tobacco products, such as cigarettes, chewing tobacco, or e-cigarettes. If you need help quitting, ask your health care provider.  Work with your health care provider to manage your blood pressure and cholesterol.  Maintain a healthy weight. Eating and drinking  Follow instructions from your health care provider about eating or drinking restrictions. ? Limit how much caffeine you drink. Caffeine can affect your heart's rate and rhythm.  Drink enough fluid to keep your urine clear or pale yellow.  Eat a heart-healthy diet. This should include plenty of fresh fruits and vegetables. If you eat meat, it should be lean cuts. Avoid foods that are: ? High in salt, saturated fat, or sugar. ? Canned or highly processed. ? Fried. Activity  Return to your normal activities as told by your health care provider. Ask your health care provider  what activities are safe for you.  Exercise regularly, as told by your health care provider. Ask your health care provider what types of exercise are safe for you.  If your aortic valve stenosis is mild, you may need to avoid only very intense physical activity. The more severe your aortic valve stenosis is, the more activities you may need to avoid. General instructions  Take over-the-counter and prescription medicines only as told by your health care provider.  If you are a woman and you plan to become pregnant, talk with your health care provider before you become pregnant.  Tell all health care providers who care for you that you have aortic valve stenosis.  Keep all follow-up visits as told by your health care provider. This is important. Contact a health care provider if:  You have a fever. Get help right away if:  You develop chest pain or tightness.  You develop shortness of breath or difficulty breathing.  You feel light-headed.  You feel like you might faint.  Your heartbeat is irregular or faster than normal. These symptoms may represent a serious problem that is an emergency. Do not wait to see if the symptoms will go away. Get medical help right away. Call your local emergency services (  911 in the [place] Do not drive yourself to the hospital. This information is not intended to replace advice given to you by your health care provider. Make sure you discuss any questions you have with your health care provider. Document Released: 08/09/2003 Document Revised: 04/17/2016 Document Reviewed: 10/14/2015 Elsevier Interactive Patient Education  2017 [employer].

## 2018-03-15 DIAGNOSIS — Z6829 Body mass index (BMI) 29.0-29.9, adult: Secondary | ICD-10-CM | POA: Diagnosis not present

## 2018-03-15 DIAGNOSIS — J069 Acute upper respiratory infection, unspecified: Secondary | ICD-10-CM | POA: Diagnosis not present

## 2018-04-21 DIAGNOSIS — E782 Mixed hyperlipidemia: Secondary | ICD-10-CM | POA: Diagnosis not present

## 2018-04-21 DIAGNOSIS — I679 Cerebrovascular disease, unspecified: Secondary | ICD-10-CM | POA: Diagnosis not present

## 2018-04-21 DIAGNOSIS — I350 Nonrheumatic aortic (valve) stenosis: Secondary | ICD-10-CM | POA: Diagnosis not present

## 2018-04-21 DIAGNOSIS — I340 Nonrheumatic mitral (valve) insufficiency: Secondary | ICD-10-CM | POA: Diagnosis not present

## 2018-04-21 DIAGNOSIS — Z0000 Encounter for general adult medical examination without abnormal findings: Secondary | ICD-10-CM | POA: Diagnosis not present

## 2018-04-21 DIAGNOSIS — Z79899 Other long term (current) drug therapy: Secondary | ICD-10-CM | POA: Diagnosis not present

## 2018-05-24 DIAGNOSIS — H40003 Preglaucoma, unspecified, bilateral: Secondary | ICD-10-CM | POA: Diagnosis not present

## 2018-06-11 ENCOUNTER — Other Ambulatory Visit: Payer: Self-pay

## 2018-06-11 ENCOUNTER — Telehealth: Payer: Self-pay | Admitting: Cardiology

## 2018-06-11 DIAGNOSIS — I350 Nonrheumatic aortic (valve) stenosis: Secondary | ICD-10-CM

## 2018-06-11 NOTE — Telephone Encounter (Signed)
Echo order has been entered and patient is scheduled for 07-15-18.  Patient was made aware by Vincente Liberty, and verbalized understanding.

## 2018-06-11 NOTE — Telephone Encounter (Signed)
States she's supposed to have an echo this month, I had entered a recall for one but didn't see an order

## 2018-06-17 DIAGNOSIS — L0292 Furuncle, unspecified: Secondary | ICD-10-CM | POA: Diagnosis not present

## 2018-06-17 DIAGNOSIS — Z6829 Body mass index (BMI) 29.0-29.9, adult: Secondary | ICD-10-CM | POA: Diagnosis not present

## 2018-06-29 DIAGNOSIS — M1712 Unilateral primary osteoarthritis, left knee: Secondary | ICD-10-CM | POA: Diagnosis not present

## 2018-07-15 ENCOUNTER — Other Ambulatory Visit: Payer: Self-pay

## 2018-08-13 DIAGNOSIS — Z23 Encounter for immunization: Secondary | ICD-10-CM | POA: Diagnosis not present

## 2018-08-16 ENCOUNTER — Other Ambulatory Visit: Payer: Self-pay

## 2018-08-16 ENCOUNTER — Ambulatory Visit (INDEPENDENT_AMBULATORY_CARE_PROVIDER_SITE_OTHER): Payer: Medicare Other

## 2018-08-16 ENCOUNTER — Other Ambulatory Visit: Payer: Self-pay | Admitting: Cardiology

## 2018-08-16 DIAGNOSIS — I350 Nonrheumatic aortic (valve) stenosis: Secondary | ICD-10-CM

## 2018-08-16 NOTE — Progress Notes (Signed)
Complete echocardiogram has been performed.  Jimmy Nonna Renninger RDCS, RVT 

## 2018-09-09 DIAGNOSIS — Z9181 History of falling: Secondary | ICD-10-CM | POA: Diagnosis not present

## 2018-09-09 DIAGNOSIS — Z1331 Encounter for screening for depression: Secondary | ICD-10-CM | POA: Diagnosis not present

## 2018-09-09 DIAGNOSIS — Z1339 Encounter for screening examination for other mental health and behavioral disorders: Secondary | ICD-10-CM | POA: Diagnosis not present

## 2018-09-09 DIAGNOSIS — M19049 Primary osteoarthritis, unspecified hand: Secondary | ICD-10-CM | POA: Diagnosis not present

## 2018-09-28 ENCOUNTER — Ambulatory Visit: Payer: Self-pay | Admitting: Cardiology

## 2018-10-27 NOTE — Progress Notes (Addendum)
Cardiology Office Note:    Date:  69/02/8545   ID:  Ashley Ruiz, DOB [birth year], MRN [id number]  PCP:  Myer Peer, MD  Cardiologist:  Shirlee More, MD    Referring MD: Myer Peer, MD    12/15/2018: I reviewed her echocardiogram her aortic stenosis has not progressed she is a low risk candidate for the planned total knee arthroplasty will contact the patient and send a copy to her surgeon Dr. Wynelle Link   ASSESSMENT:    1. Moderate aortic stenosis   2. Essential hypertension   3. Hyperlipidemia, unspecified hyperlipidemia type    PLAN:    In order of problems listed above:  1. Asymptomatic at risk for progression recheck echocardiogram in March as her physical examination is more consistent with severe than moderate stenosis. 2. Stable continue current antihypertensives with ARB and diuretic blood pressure at target 3. Stable continue her statin safety and efficacy check kidney function potassium liver function and lipid profile   Next appointment: 6 months   Medication Adjustments/Labs and Tests Ordered: Current medicines are reviewed at length with the patient today.  Concerns regarding medicines are outlined above.  Orders Placed This Encounter  Procedures  . Lipid Profile  . Comp Met (CMET)  . ECHOCARDIOGRAM COMPLETE   No orders of the defined types were placed in this encounter.   Chief Complaint  Patient presents with  . Aortic Stenosis    History of Present Illness:    Ashley Ruiz is a 84 y.o. female with a hx of aortic stenosis  last seen 03/05/18. Echo on 08/16/18 showed moderate  AS Peak gradient  36 mm Hg mild AR ratio of VTI 0.46 and AVA 1.43 cm2. Compliance with diet, lifestyle and medications: Yes  She is under intense stress her husband severe lung disease in his home and home hospice.  Fortunately although she is tired she has had no chest pain shortness of breath syncope palpitation fever or chill.  Physical examination suggest  progression in aortic stenosis as moderate to severe we will plan a follow-up cardiac echo looking closely at indices of severity in March 2020.  At this time she is asymptomatic Past Medical History:  Diagnosis Date  . Aortic stenosis    peak/mean pressure gradient 28/16 mmHg by echo at [facility] 12/28/15  . Arthritis    OA AND PAIN BOTH KNEES; SOME PAIN IN RT HIP  . Cancer (HCC)    SKIN CANCER ON LT EAR  . Essential hypertension 02/01/2016  . Heart murmur   . Hip arthritis 02/01/2016  . Hypercholesteremia   . Hyperlipidemia 02/01/2016  . Hypertension   . Moderate aortic stenosis 02/01/2016  . PONV (postoperative nausea and vomiting)   . Stroke [facility])    ?   SMALL TIA , PATIENT HAD 1 EPISODE DOUBLE VISION 12/2014  . TIA (transient ischemic attack) 02/09/2017    Past Surgical History:  Procedure Laterality Date  . ABDOMINAL HYSTERECTOMY  ? 2005  . CHOLECYSTECTOMY  ? 1982  . TONSILLECTOMY  1943  . TOTAL HIP ARTHROPLASTY Right 03/12/2016   Procedure: RIGHT TOTAL HIP ARTHROPLASTY ANTERIOR APPROACH WITH LEFT KNEE INJECTION;  Surgeon: Gaynelle Arabian, MD;  Location: [facility];  Service: Orthopedics;  Laterality: Right;  . TOTAL KNEE ARTHROPLASTY Right 01/23/2014   Procedure: RIGHT TOTAL KNEE ARTHROPLASTY;  Surgeon: [name], MD;  Location: [facility];  Service: Orthopedics;  Laterality: Right;    Current Medications: Current Meds  Medication Sig  .  amLODipine (NORVASC) 5 MG tablet Take 1 tablet (5 mg total) by mouth daily.  . Biotin 10000 MCG TABS Take 1 tablet by mouth daily.  . Cholecalciferol (VITAMIN D3) 400 units tablet Take 400 Units by mouth daily.  . clopidogrel (PLAVIX) 75 MG tablet Take 75 mg by mouth daily.  . Coenzyme Q10 (CO Q 10) 100 MG CAPS Take 1 capsule by mouth daily.  Marland Kitchen ibuprofen (ADVIL,MOTRIN) 200 MG tablet Take 200 mg by mouth every 6 (six) hours as needed for mild pain.  Marland Kitchen latanoprost (XALATAN) 0.005 % ophthalmic solution Place 1 drop into the right eye at  bedtime.   Marland Kitchen losartan (COZAAR) 100 MG tablet Take 100 mg by mouth every morning.   . simvastatin (ZOCOR) 10 MG tablet Take 10 mg by mouth at bedtime.   Marland Kitchen spironolactone-hydrochlorothiazide (ALDACTAZIDE) 25-25 MG tablet Take 1 tablet by mouth every other day.     Allergies:   Biaxin [clarithromycin]   Social History   Socioeconomic History  . Marital status: Married    Spouse name: Not on file  . Number of children: Not on file  . Years of education: Not on file  . Highest education level: Not on file  Occupational History  . Not on file  Social Needs  . Financial resource strain: Not on file  . Food insecurity:    Worry: Not on file    Inability: Not on file  . Transportation needs:    Medical: Not on file    Non-medical: Not on file  Tobacco Use  . Smoking status: Former Smoker    Packs/day: 0.25    Years: 10.00    Pack years: 2.50    Types: Cigarettes    Last attempt to quit: 04/12/1985    Years since quitting: 33.5  . Smokeless tobacco: Never Used  Substance and Sexual Activity  . Alcohol use: Yes    Comment: QUIT SMOKING 30 YRS AGO  RARE GLASS WINE  . Drug use: No  . Sexual activity: Not on file  Lifestyle  . Physical activity:    Days per week: Not on file    Minutes per session: Not on file  . Stress: Not on file  Relationships  . Social connections:    Talks on phone: Not on file    Gets together: Not on file    Attends religious service: Not on file    Active member of club or organization: Not on file    Attends meetings of clubs or organizations: Not on file    Relationship status: Not on file  Other Topics Concern  . Not on file  Social History Narrative  . Not on file     Family History: The patient's family history includes Cancer in her brother; Heart attack in her father; Other in her mother. ROS:   Please see the history of present illness.    All other systems reviewed and are negative.  EKGs/Labs/Other Studies Reviewed:    The  following studies were reviewed today:  Recent Labs: No results found for requested labs within last 8760 hours.  Recent Lipid Panel No results found for: CHOL, TRIG, HDL, CHOLHDL, VLDL, LDLCALC, LDLDIRECT  Physical Exam:    VS:  BP (!) 112/54 (BP Location: Left Arm, Patient Position: Sitting, Cuff Size: Normal)   Pulse 68   Ht _0  (1.575 m)   Wt 158 lb (71.7 kg)   SpO2 95%   BMI 28.90 kg/m     Wt  Readings from Last 3 Encounters:  10/28/18 158 lb (71.7 kg)  03/05/18 162 lb (73.5 kg)  02/09/17 157 lb (71.2 kg)     GEN:  Well nourished, well developed in no acute distress HEENT: Normal NECK: No JVD; No carotid bruits LYMPHATICS: No lymphadenopathy CARDIAC: Grade 2/6 to 3/6 systolic ejection murmur aortic area peaks late S2 is single radiates to the base of the carotids RRR, , rubs, gallops RESPIRATORY:  Clear to auscultation without rales, wheezing or rhonchi  ABDOMEN: Soft, non-tender, non-distended MUSCULOSKELETAL:  No edema; No deformity  SKIN: Warm and dry NEUROLOGIC:  Alert and oriented x 3 PSYCHIATRIC:  Normal affect    Signed, Shirlee More, MD  10/28/2018 4:46 PM    [facility]

## 2018-10-28 ENCOUNTER — Ambulatory Visit (INDEPENDENT_AMBULATORY_CARE_PROVIDER_SITE_OTHER): Payer: Medicare Other | Admitting: Cardiology

## 2018-10-28 ENCOUNTER — Encounter: Payer: Self-pay | Admitting: Cardiology

## 2018-10-28 VITALS — BP 112/54 | HR 68 | Ht 62.0 in | Wt 158.0 lb

## 2018-10-28 DIAGNOSIS — I10 Essential (primary) hypertension: Secondary | ICD-10-CM | POA: Diagnosis not present

## 2018-10-28 DIAGNOSIS — I350 Nonrheumatic aortic (valve) stenosis: Secondary | ICD-10-CM | POA: Diagnosis not present

## 2018-10-28 DIAGNOSIS — E785 Hyperlipidemia, unspecified: Secondary | ICD-10-CM | POA: Diagnosis not present

## 2018-10-28 NOTE — Patient Instructions (Signed)
Medication Instructions:  Your physician recommends that you continue on your current medications as directed. Please refer to the Current Medication list given to you today.  If you need a refill on your cardiac medications before your next appointment, please call your pharmacy.   Lab work: Your physician recommends that you return for lab work today: CMP, lipid panel.   If you have labs (blood work) drawn today and your tests are completely normal, you will receive your results only by: Marland Kitchen MyChart Message (if you have MyChart) OR . A paper copy in the mail If you have any lab test that is abnormal or we need to change your treatment, we will call you to review the results.  Testing/Procedures: Your physician has requested that you have an echocardiogram. Echocardiography is a painless test that uses sound waves to create images of your heart. It provides your doctor with information about the size and shape of your heart and how well your heart's chambers and valves are working. This procedure takes approximately one hour. There are no restrictions for this procedure.  Follow-Up: At [facility], you and your health needs are our priority.  As part of our continuing mission to provide you with exceptional heart care, we have created designated Provider Care Teams.  These Care Teams include your primary Cardiologist (physician) and Advanced Practice Providers (APPs -  Physician Assistants and Nurse Practitioners) who all work together to provide you with the care you need, when you need it. You will need a follow up appointment in 6 months.  Please call our office 2 months in advance to schedule this appointment.      Echocardiogram An echocardiogram, or echocardiography, uses sound waves (ultrasound) to produce an image of your heart. The echocardiogram is simple, painless, obtained within a short period of time, and offers valuable information to your health care provider. The images from  an echocardiogram can provide information such as:  Evidence of coronary artery disease (CAD).  Heart size.  Heart muscle function.  Heart valve function.  Aneurysm detection.  Evidence of a past heart attack.  Fluid buildup around the heart.  Heart muscle thickening.  Assess heart valve function.  Tell a health care provider about:  Any allergies you have.  All medicines you are taking, including vitamins, herbs, eye drops, creams, and over-the-counter medicines.  Any problems you or family members have had with anesthetic medicines.  Any blood disorders you have.  Any surgeries you have had.  Any medical conditions you have.  Whether you are pregnant or may be pregnant. What happens before the procedure? No special preparation is needed. Eat and drink normally. What happens during the procedure?  In order to produce an image of your heart, gel will be applied to your chest and a wand-like tool (transducer) will be moved over your chest. The gel will help transmit the sound waves from the transducer. The sound waves will harmlessly bounce off your heart to allow the heart images to be captured in real-time motion. These images will then be recorded.  You may need an IV to receive a medicine that improves the quality of the pictures. What happens after the procedure? You may return to your normal schedule including diet, activities, and medicines, unless your health care provider tells you otherwise. This information is not intended to replace advice given to you by your health care provider. Make sure you discuss any questions you have with your health care provider. Document Released: 11/07/2000  Document Revised: 06/28/2016 Document Reviewed: 07/18/2013 Elsevier Interactive Patient Education  2017 [employer].

## 2018-10-29 LAB — LIPID PANEL
CHOLESTEROL TOTAL: 149 mg/dL (ref 100–199)
Chol/HDL Ratio: 2.8 ratio (ref 0.0–4.4)
HDL: 53 mg/dL (ref >39)
LDL Calculated: 73 mg/dL (ref 0–99)
TRIGLYCERIDES: 114 mg/dL (ref 0–149)
VLDL CHOLESTEROL CAL: 23 mg/dL (ref 5–40)

## 2018-10-29 LAB — COMPREHENSIVE METABOLIC PANEL
A/G RATIO: 2 (ref 1.2–2.2)
ALBUMIN: 4.3 g/dL (ref 3.5–4.7)
ALK PHOS: 81 IU/L (ref 39–117)
ALT: 17 IU/L (ref 0–32)
AST: 22 IU/L (ref 0–40)
BILIRUBIN TOTAL: 0.4 mg/dL (ref 0.0–1.2)
BUN / CREAT RATIO: 28 (ref 12–28)
BUN: 22 mg/dL (ref 8–27)
CHLORIDE: 101 mmol/L (ref 96–106)
CO2: 23 mmol/L (ref 20–29)
Calcium: 9.5 mg/dL (ref 8.7–10.3)
Creatinine, Ser: 0.78 mg/dL (ref 0.57–1.00)
GFR calc non Af Amer: 69 mL/min/{1.73_m2} (ref >59)
GFR, EST AFRICAN AMERICAN: 79 mL/min/{1.73_m2} (ref >59)
GLOBULIN, TOTAL: 2.2 g/dL (ref 1.5–4.5)
GLUCOSE: 83 mg/dL (ref 65–99)
POTASSIUM: 4.1 mmol/L (ref 3.5–5.2)
SODIUM: 139 mmol/L (ref 134–144)
TOTAL PROTEIN: 6.5 g/dL (ref 6.0–8.5)

## 2018-12-02 DIAGNOSIS — M1712 Unilateral primary osteoarthritis, left knee: Secondary | ICD-10-CM | POA: Diagnosis not present

## 2018-12-08 ENCOUNTER — Ambulatory Visit (INDEPENDENT_AMBULATORY_CARE_PROVIDER_SITE_OTHER): Payer: Medicare Other

## 2018-12-08 DIAGNOSIS — I350 Nonrheumatic aortic (valve) stenosis: Secondary | ICD-10-CM

## 2018-12-08 NOTE — Progress Notes (Signed)
Complete echocardiogram has been performed.  Jimmy Jasen Hartstein RDCS, RVT 

## 2018-12-10 ENCOUNTER — Other Ambulatory Visit: Payer: Self-pay

## 2018-12-21 DIAGNOSIS — E663 Overweight: Secondary | ICD-10-CM | POA: Diagnosis not present

## 2018-12-21 DIAGNOSIS — G4700 Insomnia, unspecified: Secondary | ICD-10-CM | POA: Diagnosis not present

## 2018-12-21 DIAGNOSIS — F4321 Adjustment disorder with depressed mood: Secondary | ICD-10-CM | POA: Diagnosis not present

## 2018-12-21 DIAGNOSIS — Z6829 Body mass index (BMI) 29.0-29.9, adult: Secondary | ICD-10-CM | POA: Diagnosis not present

## 2019-01-18 NOTE — H&P (Signed)
TOTAL KNEE ADMISSION H&P  Patient is being admitted for left total knee arthroplasty.  Subjective:  Chief Complaint:left knee pain.  HPI: Ashley Ruiz, 85 y.o. female, has a history of pain and functional disability in the left knee due to arthritis and has failed non-surgical conservative treatments for greater than 12 weeks to includecorticosteriod injections and activity modification.  Onset of symptoms was gradual, starting several years ago with gradually worsening course since that time. The patient noted no past surgery on the left knee(s).  Patient currently rates pain in the left knee(s) at 6 out of 10 with activity. Patient has worsening of pain with activity and weight bearing and crepitus.  Patient has evidence of bone-on-bone osteoarthritis in the medial and patellofemoral compartments with varus deformity by imaging studies. There is no active infection.  Patient Active Problem List   Diagnosis Date Noted  . TIA (transient ischemic attack) 02/09/2017  . OA (osteoarthritis) of hip 03/12/2016  . Moderate aortic stenosis 02/01/2016  . Essential hypertension 02/01/2016  . Hip arthritis 02/01/2016  . Hyperlipidemia 02/01/2016  . OA (osteoarthritis) of knee 01/23/2014   Past Medical History:  Diagnosis Date  . Aortic stenosis    peak/mean pressure gradient 28/16 mmHg by echo at [facility] 12/28/15  . Arthritis    OA AND PAIN BOTH KNEES; SOME PAIN IN RT HIP  . Cancer (HCC)    SKIN CANCER ON LT EAR  . Essential hypertension 02/01/2016  . Heart murmur   . Hip arthritis 02/01/2016  . Hypercholesteremia   . Hyperlipidemia 02/01/2016  . Hypertension   . Moderate aortic stenosis 02/01/2016  . PONV (postoperative nausea and vomiting)   . Stroke [facility])    ?   SMALL TIA , PATIENT HAD 1 EPISODE DOUBLE VISION 12/2014  . TIA (transient ischemic attack) 02/09/2017    Past Surgical History:  Procedure Laterality Date  . ABDOMINAL HYSTERECTOMY  ? 2005  . CHOLECYSTECTOMY  ? 1982    . TONSILLECTOMY  1943  . TOTAL HIP ARTHROPLASTY Right 03/12/2016   Procedure: RIGHT TOTAL HIP ARTHROPLASTY ANTERIOR APPROACH WITH LEFT KNEE INJECTION;  Surgeon: Gaynelle Arabian, MD;  Location: [facility] ORS;  Service: Orthopedics;  Laterality: Right;  . TOTAL KNEE ARTHROPLASTY Right 01/23/2014   Procedure: RIGHT TOTAL KNEE ARTHROPLASTY;  Surgeon: [name], MD;  Location: [facility] ORS;  Service: Orthopedics;  Laterality: Right;    No current facility-administered medications for this encounter.    Current Outpatient Medications  Medication Sig Dispense Refill Last Dose  . amLODipine (NORVASC) 5 MG tablet Take 1 tablet (5 mg total) by mouth daily. 30 tablet 7 Taking  . Biotin 10000 MCG TABS Take 1 tablet by mouth daily.   Taking  . Cholecalciferol (VITAMIN D3) 400 units tablet Take 400 Units by mouth daily.   Taking  . clopidogrel (PLAVIX) 75 MG tablet Take 75 mg by mouth daily.  12 Taking  . Coenzyme Q10 (CO Q 10) 100 MG CAPS Take 1 capsule by mouth daily.   Taking  . ibuprofen (ADVIL,MOTRIN) 200 MG tablet Take 200 mg by mouth every 6 (six) hours as needed for mild pain.   Taking  . latanoprost (XALATAN) 0.005 % ophthalmic solution Place 1 drop into the right eye at bedtime.    Taking  . losartan (COZAAR) 100 MG tablet Take 100 mg by mouth every morning.    Taking  . simvastatin (ZOCOR) 10 MG tablet Take 10 mg by mouth at bedtime.    Taking  .  spironolactone-hydrochlorothiazide (ALDACTAZIDE) 25-25 MG tablet Take 1 tablet by mouth every other day.   Taking   Allergies  Allergen Reactions  . Biaxin [Clarithromycin] Nausea And Vomiting    Social History   Tobacco Use  . Smoking status: Former Smoker    Packs/day: 0.25    Years: 10.00    Pack years: 2.50    Types: Cigarettes    Last attempt to quit: 04/12/1985    Years since quitting: 33.7  . Smokeless tobacco: Never Used  Substance Use Topics  . Alcohol use: Yes    Comment: QUIT SMOKING 30 YRS AGO  RARE GLASS WINE    Family History   Problem Relation Age of Onset  . Other Mother   . Heart attack Father   . Cancer Brother      Review of Systems  Constitutional: Negative for chills and fever.  HENT: Negative for congestion, sore throat and tinnitus.   Eyes: Negative for double vision, photophobia and pain.  Respiratory: Negative for cough, shortness of breath and wheezing.   Cardiovascular: Negative for chest pain, palpitations and orthopnea.  Gastrointestinal: Negative for heartburn, nausea and vomiting.  Genitourinary: Negative for dysuria, frequency and urgency.  Musculoskeletal: Positive for joint pain.  Neurological: Negative for dizziness, weakness and headaches.    Objective:  Physical Exam  Well nourished and well developed. General: Alert and oriented x3, cooperative and pleasant, no acute distress. Head: normocephalic, atraumatic, neck supple. Eyes: EOMI. Respiratory: breath sounds clear in all fields, no wheezing, rales, or rhonchi. Cardiovascular: Regular rate and rhythm, no murmurs, gallops or rubs.  Abdomen: non-tender to palpation and soft, normoactive bowel sounds. Musculoskeletal:  Left Knee Exam:  Varus deformity. No effusion. No Swelling. Range of motion is 5-125 degrees.  Moderate crepitus on range of motion of the knee.  Significant medial joint line tenderness Moderate lateral joint line tenderness.  Stable knee.  Calves soft and nontender. Motor function intact in LE. Strength 5/5 LE bilaterally. Neuro: Distal pulses 2+. Sensation to light touch intact in LE.  Vital signs in last 24 hours: Blood pressure: 130/64 mmHg Pulse: 64 bpm  Labs:   Estimated body mass index is 28.9 kg/m as calculated from the following:   Height as of 10/28/18: 5\' 2"  (1.575 m).   Weight as of 10/28/18: 71.7 kg.   Imaging Review Plain radiographs demonstrate severe degenerative joint disease of the left knee(s). The overall alignment isneutral. The bone quality appears to be adequate for age and  reported activity level.      Assessment/Plan:  End stage arthritis, left knee   The patient history, physical examination, clinical judgment of the provider and imaging studies are consistent with end stage degenerative joint disease of the left knee(s) and total knee arthroplasty is deemed medically necessary. The treatment options including medical management, injection therapy arthroscopy and arthroplasty were discussed at length. The risks and benefits of total knee arthroplasty were presented and reviewed. The risks due to aseptic loosening, infection, stiffness, patella tracking problems, thromboembolic complications and other imponderables were discussed. The patient acknowledged the explanation, agreed to proceed with the plan and consent was signed. Patient is being admitted for inpatient treatment for surgery, pain control, PT, OT, prophylactic antibiotics, VTE prophylaxis, progressive ambulation and ADL's and discharge planning. The patient is planning to be discharged home.     Anticipated LOS equal to or greater than 2 midnights due to - Age 29 and older with one or more of the following:  - Obesity  -  Expected need for hospital services (PT, OT, Nursing) required for safe  discharge  - Anticipated need for postoperative skilled nursing care or inpatient rehab  - Active co-morbidities: Stroke OR   - Unanticipated findings during/Post Surgery: None  - Patient is a high risk of re-admission due to: None   Therapy Plans: outpatient therapy at [facility] in [place] Disposition: Home with son and daughter-in-law Planned DVT Prophylaxis: Plavix (Hx of Aortic Stenosis) DME needed: none PCP: Dr. Venetia Maxon Cardiologist: Dr. Bettina Gavia TXA: IV Allergies: Clarithromycin - severe nausea/vomiting, Rifampin - unsure of rxn Anesthesia Concerns: none BMI: 29.8  - Patient was instructed on what medications to stop prior to surgery. - Follow-up visit in 2 weeks with Dr. Wynelle Link - Begin  physical therapy following surgery - Pre-operative lab work as pre-surgical testing - Prescriptions will be provided in hospital at time of discharge  Theresa Duty, PA-C Orthopedic Surgery [facility] [place]

## 2019-01-26 ENCOUNTER — Other Ambulatory Visit: Payer: Self-pay

## 2019-02-03 NOTE — Progress Notes (Signed)
Clearance Dr. Bettina Gavia 12-16-18 chart  Clearance Dr. Venetia Maxon 12-30-18 chart  ekg 03-06-19 epic  Echo 12-08-18 epic

## 2019-02-03 NOTE — Patient Instructions (Signed)
Ashley Ruiz  [id number]   Your procedure is scheduled on: 02-14-19   Report to [facility] Main  Entrance             Report to  [facility] at        0530 AM    Call this number if you have problems the morning of surgery [phone]    Remember: Do not eat food or drink liquids :After Midnight.  BRUSH YOUR TEETH MORNING OF SURGERY AND RINSE YOUR MOUTH OUT, NO CHEWING GUM CANDY OR MINTS.     Take these medicines the morning of surgery with A SIP OF WATER: AMLODIPINE                                You may not have any metal on your body including hair pins and              piercings  Do not wear jewelry, make-up, lotions, powders or perfumes, deodorant             Do not wear nail polish.  Do not shave  48 hours prior to surgery.            Do not bring valuables to the hospital. [place].  Contacts, dentures or bridgework may not be worn into surgery.  Leave suitcase in the car. After surgery it may be brought to your room.                Please read over the following fact sheets you were given: _____________________________________________________________________           [facility] - Preparing for Surgery Before surgery, you can play an important role.  Because skin is not sterile, your skin needs to be as free of germs as possible.  You can reduce the number of germs on your skin by washing with CHG (chlorahexidine gluconate) soap before surgery.  CHG is an antiseptic cleaner which kills germs and bonds with the skin to continue killing germs even after washing. Please DO NOT use if you have an allergy to CHG or antibacterial soaps.  If your skin becomes reddened/irritated stop using the CHG and inform your nurse when you arrive at Short Stay. Do not shave (including legs and underarms) for at least 48 hours prior to the first CHG shower.  You may shave your face/neck. Please follow these  instructions carefully:  1.  Shower with CHG Soap the night before surgery and the  morning of Surgery.  2.  If you choose to wash your hair, wash your hair first as usual with your  normal  shampoo.  3.  After you shampoo, rinse your hair and body thoroughly to remove the  shampoo.                           4.  Use CHG as you would any other liquid soap.  You can apply chg directly  to the skin and wash                       Gently with a scrungie or clean washcloth.  5.  Apply the CHG  Soap to your body ONLY FROM THE NECK DOWN.   Do not use on face/ open                           Wound or open sores. Avoid contact with eyes, ears mouth and genitals (private parts).                       Wash face,  Genitals (private parts) with your normal soap.             6.  Wash thoroughly, paying special attention to the area where your surgery  will be performed.  7.  Thoroughly rinse your body with warm water from the neck down.  8.  DO NOT shower/wash with your normal soap after using and rinsing off  the CHG Soap.                9.  Pat yourself dry with a clean towel.            10.  Wear clean pajamas.            11.  Place clean sheets on your bed the night of your first shower and do not  sleep with pets. Day of Surgery : Do not apply any lotions/deodorants the morning of surgery.  Please wear clean clothes to the hospital/surgery center.  FAILURE TO FOLLOW THESE INSTRUCTIONS MAY RESULT IN THE CANCELLATION OF YOUR SURGERY PATIENT SIGNATURE_________________________________  NURSE SIGNATURE__________________________________  ________________________________________________________________________  WHAT IS A BLOOD TRANSFUSION? Blood Transfusion Information  A transfusion is the replacement of blood or some of its parts. Blood is made up of multiple cells which provide different functions.  Red blood cells carry oxygen and are used for blood loss replacement.  White blood cells fight against  infection.  Platelets control bleeding.  Plasma helps clot blood.  Other blood products are available for specialized needs, such as hemophilia or other clotting disorders. BEFORE THE TRANSFUSION  Who gives blood for transfusions?   Healthy volunteers who are fully evaluated to make sure their blood is safe. This is blood bank blood. Transfusion therapy is the safest it has ever been in the practice of medicine. Before blood is taken from a donor, a complete history is taken to make sure that person has no history of diseases nor engages in risky social behavior (examples are intravenous drug use or sexual activity with multiple partners). The donor's travel history is screened to minimize risk of transmitting infections, such as malaria. The donated blood is tested for signs of infectious diseases, such as HIV and hepatitis. The blood is then tested to be sure it is compatible with you in order to minimize the chance of a transfusion reaction. If you or a relative donates blood, this is often done in anticipation of surgery and is not appropriate for emergency situations. It takes many days to process the donated blood. RISKS AND COMPLICATIONS Although transfusion therapy is very safe and saves many lives, the main dangers of transfusion include:   Getting an infectious disease.  Developing a transfusion reaction. This is an allergic reaction to something in the blood you were given. Every precaution is taken to prevent this. The decision to have a blood transfusion has been considered carefully by your caregiver before blood is given. Blood is not given unless the benefits outweigh the risks. AFTER THE TRANSFUSION  Right after receiving a blood transfusion, you  will usually feel much better and more energetic. This is especially true if your red blood cells have gotten low (anemic). The transfusion raises the level of the red blood cells which carry oxygen, and this usually causes an energy  increase.  The nurse administering the transfusion will monitor you carefully for complications. HOME CARE INSTRUCTIONS  No special instructions are needed after a transfusion. You may find your energy is better. Speak with your caregiver about any limitations on activity for underlying diseases you may have. SEEK MEDICAL CARE IF:   Your condition is not improving after your transfusion.  You develop redness or irritation at the intravenous (IV) site. SEEK IMMEDIATE MEDICAL CARE IF:  Any of the following symptoms occur over the next 12 hours:  Shaking chills.  You have a temperature by mouth above 102 F (38.9 C), not controlled by medicine.  Chest, back, or muscle pain.  People around you feel you are not acting correctly or are confused.  Shortness of breath or difficulty breathing.  Dizziness and fainting.  You get a rash or develop hives.  You have a decrease in urine output.  Your urine turns a dark color or changes to pink, red, or brown. Any of the following symptoms occur over the next 10 days:  You have a temperature by mouth above 102 F (38.9 C), not controlled by medicine.  Shortness of breath.  Weakness after normal activity.  The white part of the eye turns yellow (jaundice).  You have a decrease in the amount of urine or are urinating less often.  Your urine turns a dark color or changes to pink, red, or brown. Document Released: 11/07/2000 Document Revised: 02/02/2012 Document Reviewed: 06/26/2008 ExitCare Patient Information 2014 [place].  _______________________________________________________________________  Incentive Spirometer  An incentive spirometer is a tool that can help keep your lungs clear and active. This tool measures how well you are filling your lungs with each breath. Taking long deep breaths may help reverse or decrease the chance of developing breathing (pulmonary) problems (especially infection) following:  A long  period of time when you are unable to move or be active. BEFORE THE PROCEDURE   If the spirometer includes an indicator to show your best effort, your nurse or respiratory therapist will set it to a desired goal.  If possible, sit up straight or lean slightly forward. Try not to slouch.  Hold the incentive spirometer in an upright position. INSTRUCTIONS FOR USE  1. Sit on the edge of your bed if possible, or sit up as far as you can in bed or on a chair. 2. Hold the incentive spirometer in an upright position. 3. Breathe out normally. 4. Place the mouthpiece in your mouth and seal your lips tightly around it. 5. Breathe in slowly and as deeply as possible, raising the piston or the ball toward the top of the column. 6. Hold your breath for 3-5 seconds or for as long as possible. Allow the piston or ball to fall to the bottom of the column. 7. Remove the mouthpiece from your mouth and breathe out normally. 8. Rest for a few seconds and repeat Steps 1 through 7 at least 10 times every 1-2 hours when you are awake. Take your time and take a few normal breaths between deep breaths. 9. The spirometer may include an indicator to show your best effort. Use the indicator as a goal to work toward during each repetition. 10. After each set of 10 deep breaths, practice  coughing to be sure your lungs are clear. If you have an incision (the cut made at the time of surgery), support your incision when coughing by placing a pillow or rolled up towels firmly against it. Once you are able to get out of bed, walk around indoors and cough well. You may stop using the incentive spirometer when instructed by your caregiver.  RISKS AND COMPLICATIONS  Take your time so you do not get dizzy or light-headed.  If you are in pain, you may need to take or ask for pain medication before doing incentive spirometry. It is harder to take a deep breath if you are having pain. AFTER USE  Rest and breathe slowly and  easily.  It can be helpful to keep track of a log of your progress. Your caregiver can provide you with a simple table to help with this. If you are using the spirometer at home, follow these instructions: [place] IF:   You are having difficultly using the spirometer.  You have trouble using the spirometer as often as instructed.  Your pain medication is not giving enough relief while using the spirometer.  You develop fever of 100.5 F (38.1 C) or higher. SEEK IMMEDIATE MEDICAL CARE IF:   You cough up bloody sputum that had not been present before.  You develop fever of 102 F (38.9 C) or greater.  You develop worsening pain at or near the incision site. MAKE SURE YOU:   Understand these instructions.  Will watch your condition.  Will get help right away if you are not doing well or get worse. Document Released: 03/23/2007 Document Revised: 02/02/2012 Document Reviewed: 05/24/2007 [facility] Patient Information 2014 [place], [place].   ________________________________________________________________________

## 2019-02-08 ENCOUNTER — Encounter (HOSPITAL_COMMUNITY)
Admission: RE | Admit: 2019-02-08 | Discharge: 2019-02-08 | Disposition: A | Discharge status: Home / Self Care | Payer: Medicare Other | Source: Ambulatory Visit | Attending: Orthopedic Surgery | Admitting: Orthopedic Surgery

## 2019-03-15 ENCOUNTER — Telehealth: Payer: Self-pay | Admitting: Cardiology

## 2019-03-15 NOTE — Telephone Encounter (Signed)
Virtual Visit Pre-Appointment Phone Call  "(Name), I am calling you today to discuss your upcoming appointment. We are currently trying to limit exposure to the virus that causes COVID-19 by seeing patients at home rather than in the office."  1. "What is the BEST phone number to call the day of the visit?" - include this in appointment notes  2. Do you have or have access to (through a family member/friend) a smartphone with video capability that we can use for your visit?" a. If yes - list this number in appt notes as cell (if different from BEST phone #) and list the appointment type as a VIDEO visit in appointment notes b. If no - list the appointment type as a PHONE visit in appointment notes  3. Confirm consent - "In the setting of the current Covid19 crisis, you are scheduled for a (phone or video) visit with your provider on (date) at (time).  Just as we do with many in-office visits, in order for you to participate in this visit, we must obtain consent.  If you'd like, I can send this to your mychart (if signed up) or email for you to review.  Otherwise, I can obtain your verbal consent now.  All virtual visits are billed to your insurance company just like a normal visit would be.  By agreeing to a virtual visit, we'd like you to understand that the technology does not allow for your provider to perform an examination, and thus may limit your provider's ability to fully assess your condition. If your provider identifies any concerns that need to be evaluated in person, we will make arrangements to do so.  Finally, though the technology is pretty good, we cannot assure that it will always work on either your or our end, and in the setting of a video visit, we may have to convert it to a phone-only visit.  In either situation, we cannot ensure that we have a secure connection.  Are you willing to proceed?" STAFF: Did the patient verbally acknowledge consent to telehealth visit? Document  YES/NO here: Yes  4. Advise patient to be prepared - "Two hours prior to your appointment, go ahead and check your blood pressure, pulse, oxygen saturation, and your weight (if you have the equipment to check those) and write them all down. When your visit starts, your provider will ask you for this information. If you have an Apple Watch or Kardia device, please plan to have heart rate information ready on the day of your appointment. Please have a pen and paper handy nearby the day of the visit as well."  5. Give patient instructions for MyChart download to smartphone OR Doximity/[email or web address] as below if video visit (depending on what platform provider is using)  6. Inform patient they will receive a phone call 15 minutes prior to their appointment time (may be from unknown caller ID) so they should be prepared to answer    Ashley Ruiz has been deemed a candidate for a follow-up tele-health visit to limit community exposure during the Covid-19 pandemic. I spoke with the patient via phone to ensure availability of phone/video source, confirm preferred email & phone number, and discuss instructions and expectations.  I reminded Ashley Ruiz to be prepared with any vital sign and/or heart rhythm information that could potentially be obtained via home monitoring, at the time of her visit. I reminded Ashley Ruiz to expect a phone call prior to  her visit.  Ashley Ruiz 03/15/2019 3:55 PM   FULL LENGTH CONSENT FOR TELE-HEALTH VISIT   I hereby voluntarily request, consent and authorize [facility] and its employed or contracted physicians, physician assistants, nurse practitioners or other licensed health care professionals (the Practitioner), to provide me with telemedicine health care services (the Services") as deemed necessary by the treating Practitioner. I acknowledge and consent to receive the Services by the Practitioner via telemedicine. I understand that the  telemedicine visit will involve communicating with the Practitioner through live audiovisual communication technology and the disclosure of certain medical information by electronic transmission. I acknowledge that I have been given the opportunity to request an in-person assessment or other available alternative prior to the telemedicine visit and am voluntarily participating in the telemedicine visit.  I understand that I have the right to withhold or withdraw my consent to the use of telemedicine in the course of my care at any time, without affecting my right to future care or treatment, and that the Practitioner or I may terminate the telemedicine visit at any time. I understand that I have the right to inspect all information obtained and/or recorded in the course of the telemedicine visit and may receive copies of available information for a reasonable fee.  I understand that some of the potential risks of receiving the Services via telemedicine include:   Delay or interruption in medical evaluation due to technological equipment failure or disruption;  Information transmitted may not be sufficient (e.g. poor resolution of images) to allow for appropriate medical decision making by the Practitioner; and/or   In rare instances, security protocols could fail, causing a breach of personal health information.  Furthermore, I acknowledge that it is my responsibility to provide information about my medical history, conditions and care that is complete and accurate to the best of my ability. I acknowledge that Practitioner's advice, recommendations, and/or decision may be based on factors not within their control, such as incomplete or inaccurate data provided by me or distortions of diagnostic images or specimens that may result from electronic transmissions. I understand that the practice of medicine is not an exact science and that Practitioner makes no warranties or guarantees regarding treatment  outcomes. I acknowledge that I will receive a copy of this consent concurrently upon execution via email to the email address I last provided but may also request a printed copy by calling the office of [place].    I understand that my insurance will be billed for this visit.   I have read or had this consent read to me.  I understand the contents of this consent, which adequately explains the benefits and risks of the Services being provided via telemedicine.   I have been provided ample opportunity to ask questions regarding this consent and the Services and have had my questions answered to my satisfaction.  I give my informed consent for the services to be provided through the use of telemedicine in my medical care  By participating in this telemedicine visit I agree to the above.

## 2019-03-18 ENCOUNTER — Other Ambulatory Visit: Payer: Self-pay

## 2019-03-18 ENCOUNTER — Encounter: Payer: Self-pay | Admitting: Cardiology

## 2019-03-18 ENCOUNTER — Telehealth (INDEPENDENT_AMBULATORY_CARE_PROVIDER_SITE_OTHER): Payer: Medicare Other | Admitting: Cardiology

## 2019-03-18 VITALS — BP 137/61 | HR 65 | Ht 62.0 in | Wt 157.0 lb

## 2019-03-18 DIAGNOSIS — I119 Hypertensive heart disease without heart failure: Secondary | ICD-10-CM

## 2019-03-18 DIAGNOSIS — Z01810 Encounter for preprocedural cardiovascular examination: Secondary | ICD-10-CM | POA: Diagnosis not present

## 2019-03-18 DIAGNOSIS — E782 Mixed hyperlipidemia: Secondary | ICD-10-CM

## 2019-03-18 DIAGNOSIS — I351 Nonrheumatic aortic (valve) insufficiency: Secondary | ICD-10-CM

## 2019-03-18 DIAGNOSIS — I350 Nonrheumatic aortic (valve) stenosis: Secondary | ICD-10-CM

## 2019-03-18 DIAGNOSIS — Z7189 Other specified counseling: Secondary | ICD-10-CM

## 2019-03-18 NOTE — Progress Notes (Signed)
Virtual Visit via Video Note   This visit type was conducted due to national recommendations for restrictions regarding the COVID-19 Pandemic (e.g. social distancing) in an effort to limit this patient's exposure and mitigate transmission in our community.  Due to her co-morbid illnesses, this patient is at least at moderate risk for complications without adequate follow up.  This format is felt to be most appropriate for this patient at this time.  All issues noted in this document were discussed and addressed.  A limited physical exam was performed with this format.  Please refer to the patient's chart for her consent to telehealth for [facility].   Evaluation Performed:  Follow-up visit  Date:  6/71/2458   ID:  Ashley Ruiz, DOB [id number], MRN [id number]  Patient Location: Home Provider Location: Home  PCP:  Street, Sharon Mt, MD  Cardiologist:  No primary care provider on file. Dr Bettina Gavia Electrophysiologist:  None   Chief Complaint:  Aortic stenosis  History of Present Illness:    Ashley Ruiz is a 85 y.o. female with a hx of aortic stenosis  last seen 03/05/18. Echo on 08/16/18 showed mild to moderate stenosis and  mild regurgitation. Mean gradient (S): 21 mm Hg. Peak gradient (S): 39 mm Hg. VTI ratio of LVOT to aortic valve: 0.48. She was last seen 10/29/19.  The patient does not have symptoms concerning for COVID-19 infection (fever, chills, cough, or new shortness of breath).   She is doing quite well with the assistance of her family.  She remains active outdoors doing garden work and has no exercise intolerance shortness of breath chest pain palpitation or syncope and her exercise tolerance exceeds 4 to 5 Mets.  She anticipates total knee arthroplasty in June from my perspective she is optimized she should continue her current cardiac medications she needs a monitored bed postoperatively EKG postoperative day 1 if any problems arise please contact [facility].  I reviewed her recent echocardiogram and her aortic stenosis has not progressed and is not severe or symptomatic. Past Medical History:  Diagnosis Date  . Aortic stenosis    peak/mean pressure gradient 28/16 mmHg by echo at [facility] 12/28/15  . Arthritis    OA AND PAIN BOTH KNEES; SOME PAIN IN RT HIP  . Cancer (HCC)    SKIN CANCER ON LT EAR  . Essential hypertension 02/01/2016  . Heart murmur   . Hip arthritis 02/01/2016  . Hypercholesteremia   . Hyperlipidemia 02/01/2016  . Hypertension   . Moderate aortic stenosis 02/01/2016  . PONV (postoperative nausea and vomiting)   . Stroke [facility])    ?   SMALL TIA , PATIENT HAD 1 EPISODE DOUBLE VISION 12/2014  . TIA (transient ischemic attack) 02/09/2017   Past Surgical History:  Procedure Laterality Date  . ABDOMINAL HYSTERECTOMY  ? 2005  . CHOLECYSTECTOMY  ? 1982  . TONSILLECTOMY  1943  . TOTAL HIP ARTHROPLASTY Right 03/12/2016   Procedure: RIGHT TOTAL HIP ARTHROPLASTY ANTERIOR APPROACH WITH LEFT KNEE INJECTION;  Surgeon: Gaynelle Arabian, MD;  Location: [facility] ORS;  Service: Orthopedics;  Laterality: Right;  . TOTAL KNEE ARTHROPLASTY Right 01/23/2014   Procedure: RIGHT TOTAL KNEE ARTHROPLASTY;  Surgeon: [name], MD;  Location: [facility] ORS;  Service: Orthopedics;  Laterality: Right;     Current Meds  Medication Sig  . amLODipine (NORVASC) 5 MG tablet Take 1 tablet (5 mg total) by mouth daily.  . Biotin 10000 MCG TABS Take 1,000 mcg  by mouth daily.   . Cholecalciferol (VITAMIN D3) 25 MCG (1000 UT) CAPS Take 1,000 Units by mouth daily.   . clopidogrel (PLAVIX) 75 MG tablet Take 75 mg by mouth daily.  . Coenzyme Q10 (CO Q 10) 100 MG CAPS Take 100 mg by mouth daily.   Marland Kitchen ibuprofen (ADVIL,MOTRIN) 200 MG tablet Take 200 mg by mouth every 6 (six) hours as needed for mild pain.  Marland Kitchen latanoprost (XALATAN) 0.005 % ophthalmic solution Place 1 drop into the right eye at bedtime.   Marland Kitchen losartan (COZAAR) 100 MG tablet Take 100 mg by mouth every  morning.   . simvastatin (ZOCOR) 10 MG tablet Take 10 mg by mouth at bedtime.      Allergies:   Biaxin [clarithromycin]   Social History   Tobacco Use  . Smoking status: Former Smoker    Packs/day: 0.25    Years: 10.00    Pack years: 2.50    Types: Cigarettes    Last attempt to quit: 04/12/1985    Years since quitting: 33.9  . Smokeless tobacco: Never Used  Substance Use Topics  . Alcohol use: Yes    Comment: RARE GLASS WINE  . Drug use: No     Family Hx: The patient's family history includes Cancer in her brother; Heart attack in her father; Other in her mother.  ROS:   Please see the history of present illness.     All other systems reviewed and are negative.   Prior CV studies:   The following studies were reviewed today:  Echo 12/08/18:   Study Conclusions   - Left ventricle: The cavity size was normal. Wall thickness was   increased in a pattern of mild LVH. Systolic function was   vigorous. The estimated ejection fraction was in the range of 65%   to 70%. Wall motion was normal; there were no regional wall   motion abnormalities. Doppler parameters are consistent with   abnormal left ventricular relaxation (grade 1 diastolic   dysfunction). - Aortic valve: Functionally bicuspid; moderately thickened, mildly   calcified leaflets. Cusp separation was mildly reduced. Valve   mobility was restricted. There was mild to moderate stenosis.   There was mild regurgitation. Mean gradient (S): 21 mm Hg. Peak   gradient (S): 39 mm Hg. VTI ratio of LVOT to aortic valve: 0.48.   Valve area (VTI): 1.52 cm^2. Peak velocity ratio of LVOT to   aortic valve: 0.41. Valve area (Vmax): 1.3 cm^2. Mean velocity   ratio of LVOT to aortic valve: 0.41. Valve area (Vmean): 1.29   cm^2. - Ascending aorta: The ascending aorta was mildly dilated. - Mitral valve: Mildly calcified annulus. There was mild   regurgitation. Valve area by pressure half-time: 2.42 cm^2.   Labs/Other Tests  and Data Reviewed:    EKG:  No ECG reviewed.  Recent Labs: 10/28/2018: ALT 17; BUN 22; Creatinine, Ser 0.78; Potassium 4.1; Sodium 139   Recent Lipid Panel Lab Results  Component Value Date/Time   CHOL 149 10/28/2018 03:16 PM   TRIG 114 10/28/2018 03:16 PM   HDL 53 10/28/2018 03:16 PM   CHOLHDL 2.8 10/28/2018 03:16 PM   LDLCALC 73 10/28/2018 03:16 PM    Wt Readings from Last 3 Encounters:  03/18/19 157 lb (71.2 kg)  10/28/18 158 lb (71.7 kg)  03/05/18 162 lb (73.5 kg)     Objective:    Vital Signs:  BP 137/61 (BP Location: Left Arm, Patient Position: Sitting)   Pulse 65  Ht 5\' 2"  (1.575 m)   Wt 157 lb (71.2 kg)   BMI 28.72 kg/m    VITAL SIGNS:  reviewed GEN:  no acute distress EYES:  sclerae anicteric, EOMI - Extraocular Movements Intact RESPIRATORY:  normal respiratory effort, symmetric expansion CARDIOVASCULAR:  no peripheral edema SKIN:  no rash, lesions or ulcers. MUSCULOSKELETAL:  no obvious deformities. NEURO:  alert and oriented x 3, no obvious focal deficit PSYCH:  normal affect  ASSESSMENT & PLAN:    1. Preoperative cardiology evaluation, from my perspective she is optimized aortic stenosis is not severe symptomatic and has not progressed and she has mild aortic regurgitation.  Hypertension is controlled she did a reading during my visit 137/61 and she will continue her usual cardiac medications no further preoperative evaluation is needed needed and see discussion under history for inpatient care. 2. Aortic stenosis stable asymptomatic will need a repeat echocardiogram towards the end of the year or January 2021 3. Aortic regurgitation stable mild asymptomatic 4. Hypertensive heart disease stable BP at target and continue current treatment including her combination distal and proximal diuretic ARB and long-acting calcium channel blocker.  These medications should be resumed postoperatively depending on her immediate postoperative blood pressure and may require  holding for a day or 2. 5. Hyperlipidemia stable continue her statin her lipids are ideal  COVID-19 Education: The signs and symptoms of COVID-19 were discussed with the patient and how to seek care for testing (follow up with PCP or arrange E-visit).  The importance of social distancing was discussed today.  Time:   Today, I have spent 30 minutes with the patient with telehealth technology discussing the above problems.     Medication Adjustments/Labs and Tests Ordered: Current medicines are reviewed at length with the patient today.  Concerns regarding medicines are outlined above.   Tests Ordered: No orders of the defined types were placed in this encounter.   Medication Changes: No orders of the defined types were placed in this encounter.   Disposition:  Follow up in 4 month(s)  Signed, Shirlee More, MD  03/18/2019 10:53 AM    [facility]

## 2019-03-18 NOTE — Patient Instructions (Addendum)
Medication Instructions:  Your physician recommends that you continue on your current medications as directed. Please refer to the Current Medication list given to you today.  If you need a refill on your cardiac medications before your next appointment, please call your pharmacy.   Lab work: None  If you have labs (blood work) drawn today and your tests are completely normal, you will receive your results only by: Marland Kitchen MyChart Message (if you have MyChart) OR . A paper copy in the mail If you have any lab test that is abnormal or we need to change your treatment, we will call you to review the results.  Testing/Procedures: None  Follow-Up: At [facility], you and your health needs are our priority.  As part of our continuing mission to provide you with exceptional heart care, we have created designated Provider Care Teams.  These Care Teams include your primary Cardiologist (physician) and Advanced Practice Providers (APPs -  Physician Assistants and Nurse Practitioners) who all work together to provide you with the care you need, when you need it. You will need a follow up appointment in 5 months: Wednesday, 07/27/2019, at 11:00 am in the [place] office.

## 2019-04-25 NOTE — H&P (Signed)
TOTAL KNEE ADMISSION H&P  Patient is being admitted for left total knee arthroplasty.  Subjective:  Chief Complaint:left knee pain.  HPI: Ashley Ruiz, 85 y.o. female, has a history of pain and functional disability in the left knee due to arthritis and has failed non-surgical conservative treatments for greater than 12 weeks to includecorticosteriod injections and activity modification.  Onset of symptoms was gradual, starting several years ago with gradually worsening course since that time. The patient noted no past surgery on the left knee(s).  Patient currently rates pain in the left knee(s) at 7 out of 10 with activity. Patient has worsening of pain with activity and weight bearing, pain with passive range of motion and crepitus.  Patient has evidence of bone-on-bone osteoarthritis in the medial and patellofemoral compartments with varus deformity. by imaging studies. There is no active infection.  Patient Active Problem List   Diagnosis Date Noted  . TIA (transient ischemic attack) 02/09/2017  . OA (osteoarthritis) of hip 03/12/2016  . Moderate aortic stenosis 02/01/2016  . Essential hypertension 02/01/2016  . Hip arthritis 02/01/2016  . Hyperlipidemia 02/01/2016  . OA (osteoarthritis) of knee 01/23/2014   Past Medical History:  Diagnosis Date  . Aortic stenosis    peak/mean pressure gradient 28/16 mmHg by echo at [facility] 12/28/15  . Arthritis    OA AND PAIN BOTH KNEES; SOME PAIN IN RT HIP  . Cancer (HCC)    SKIN CANCER ON LT EAR  . Essential hypertension 02/01/2016  . Heart murmur   . Hip arthritis 02/01/2016  . Hypercholesteremia   . Hyperlipidemia 02/01/2016  . Hypertension   . Moderate aortic stenosis 02/01/2016  . PONV (postoperative nausea and vomiting)   . Stroke [facility])    ?   SMALL TIA , PATIENT HAD 1 EPISODE DOUBLE VISION 12/2014  . TIA (transient ischemic attack) 02/09/2017    Past Surgical History:  Procedure Laterality Date  . ABDOMINAL HYSTERECTOMY  ?  2005  . CHOLECYSTECTOMY  ? 1982  . TONSILLECTOMY  1943  . TOTAL HIP ARTHROPLASTY Right 03/12/2016   Procedure: RIGHT TOTAL HIP ARTHROPLASTY ANTERIOR APPROACH WITH LEFT KNEE INJECTION;  Surgeon: Gaynelle Arabian, MD;  Location: [facility];  Service: Orthopedics;  Laterality: Right;  . TOTAL KNEE ARTHROPLASTY Right 01/23/2014   Procedure: RIGHT TOTAL KNEE ARTHROPLASTY;  Surgeon: [name], MD;  Location: [facility];  Service: Orthopedics;  Laterality: Right;    No current facility-administered medications for this encounter.    Current Outpatient Medications  Medication Sig Dispense Refill Last Dose  . amLODipine (NORVASC) 5 MG tablet Take 1 tablet (5 mg total) by mouth daily. 30 tablet 7 Taking  . Biotin 10000 MCG TABS Take 1,000 mcg by mouth daily.    Taking  . Cholecalciferol (VITAMIN D3) 25 MCG (1000 UT) CAPS Take 1,000 Units by mouth daily.    Taking  . clopidogrel (PLAVIX) 75 MG tablet Take 75 mg by mouth daily.  12 Taking  . Coenzyme Q10 (CO Q 10) 100 MG CAPS Take 100 mg by mouth daily.    Taking  . ibuprofen (ADVIL,MOTRIN) 200 MG tablet Take 200 mg by mouth every 6 (six) hours as needed for mild pain.   Taking  . latanoprost (XALATAN) 0.005 % ophthalmic solution Place 1 drop into the right eye at bedtime.    Taking  . losartan (COZAAR) 100 MG tablet Take 100 mg by mouth every morning.    Taking  . simvastatin (ZOCOR) 10 MG tablet Take 10  mg by mouth at bedtime.    Taking  . spironolactone-hydrochlorothiazide (ALDACTAZIDE) 25-25 MG tablet Take 1 tablet by mouth every other day.   Taking   Allergies  Allergen Reactions  . Biaxin [Clarithromycin] Nausea And Vomiting    Social History   Tobacco Use  . Smoking status: Former Smoker    Packs/day: 0.25    Years: 10.00    Pack years: 2.50    Types: Cigarettes    Last attempt to quit: 04/12/1985    Years since quitting: 34.0  . Smokeless tobacco: Never Used  Substance Use Topics  . Alcohol use: Yes    Comment: RARE GLASS WINE    Family  History  Problem Relation Age of Onset  . Other Mother   . Heart attack Father   . Cancer Brother      Review of Systems  Constitutional: Negative for chills and fever.  HENT: Negative for congestion, sore throat and tinnitus.   Eyes: Negative for double vision, photophobia and pain.  Respiratory: Negative for cough, shortness of breath and wheezing.   Cardiovascular: Negative for chest pain, palpitations and orthopnea.  Gastrointestinal: Negative for heartburn, nausea and vomiting.  Genitourinary: Negative for dysuria, frequency and urgency.  Musculoskeletal: Positive for joint pain.  Neurological: Negative for dizziness, weakness and headaches.    Objective:  Physical Exam  Well nourished and well developed.  General: Alert and oriented x3, cooperative and pleasant, no acute distress.  Head: normocephalic, atraumatic, neck supple.  Eyes: EOMI.  Respiratory: breath sounds clear in all fields, no wheezing, rales, or rhonchi. Cardiovascular: Regularly irregular rate and rhythm, no gallops or rubs. Systolic murmur. Abdomen: non-tender to palpation and soft, normoactive bowel sounds. Musculoskeletal:  Left Knee Exam:  Varus deformity. No effusion. No Swelling. Range of motion is 5-125 degrees.  Moderate crepitus on range of motion of the knee.  Significant medial joint line tenderness Moderate lateral joint line tenderness.  Stable knee.  Calves soft and nontender. Motor function intact in LE. Strength 5/5 LE bilaterally. Neuro: Distal pulses 2+. Sensation to light touch intact in LE.  Vital signs in last 24 hours: Blood pressure: 132/66 mmHg Pulse: 56 bpm  Labs:   Estimated body mass index is 28.72 kg/m as calculated from the following:   Height as of 03/18/19: 5\' 2"  (1.575 m).   Weight as of 03/18/19: 71.2 kg.   Imaging Review Plain radiographs demonstrate severe degenerative joint disease of the left knee(s). The overall alignment issignificant varus. The bone  quality appears to be adequate for age and reported activity level.      Assessment/Plan:  End stage arthritis, left knee   The patient history, physical examination, clinical judgment of the provider and imaging studies are consistent with end stage degenerative joint disease of the left knee(s) and total knee arthroplasty is deemed medically necessary. The treatment options including medical management, injection therapy arthroscopy and arthroplasty were discussed at length. The risks and benefits of total knee arthroplasty were presented and reviewed. The risks due to aseptic loosening, infection, stiffness, patella tracking problems, thromboembolic complications and other imponderables were discussed. The patient acknowledged the explanation, agreed to proceed with the plan and consent was signed. Patient is being admitted for inpatient treatment for surgery, pain control, PT, OT, prophylactic antibiotics, VTE prophylaxis, progressive ambulation and ADL's and discharge planning. The patient is planning to be discharged home.  Anticipated LOS equal to or greater than 2 midnights due to - Age 68 and older with one or  more of the following:  - Obesity  - Expected need for hospital services (PT, OT, Nursing) required for safe  discharge  - Anticipated need for postoperative skilled nursing care or inpatient rehab  - Active co-morbidities: Stroke and aortic stenosis OR   - Unanticipated findings during/Post Surgery: None  - Patient is a high risk of re-admission due to: None  Therapy Plans: outpatient therapy at [facility] in [place] Disposition: Home with son and daughter-in-law Planned DVT Prophylaxis: Plavix (takes for aortic stenosis) DME needed: None PCP: Dr. Venetia Maxon Cardiologist: Dr. Bettina Gavia TXA: IV Allergies: Clarithromycin (n/v), Rifampin Anesthesia Concerns: None BMI: 29.8  - Patient was instructed on what medications to stop prior to surgery. - Follow-up visit in 2 weeks with  Dr. Wynelle Link - Begin physical therapy following surgery - Pre-operative lab work as pre-surgical testing - Prescriptions will be provided in hospital at time of discharge  Theresa Duty, PA-C Orthopedic Surgery [facility] [place]

## 2019-04-27 NOTE — Progress Notes (Signed)
03/18/2019- Cardiac Clearance from Dr. Bettina Gavia on chart.  12/08/2018- noted in Epic-ECHO

## 2019-04-27 NOTE — Patient Instructions (Addendum)
Ashley Ruiz  [birth year]   Your procedure is scheduled on: Monday 05/02/2019  Report to [facility]  Entrance              Report to  Short Stay at  [place] 19 TEST ON Today, April 28, 2019 @ 12:30PM, THIS TEST MUST BE DONE BEFORE SURGERY, COME TO [place].    Call this number if you have problems the morning of surgery [phone]    Remember: Do not eat food:After Midnight.               NO SOLID FOOD AFTER MIDNIGHT THE NIGHT PRIOR TO SURGERY. NOTHING BY MOUTH EXCEPT CLEAR LIQUIDS UNTIL 0430 am.              PLEASE FINISH ENSURE DRINK PER SURGEON ORDER WHICH NEEDS TO BE COMPLETED AT  0430 am.  CLEAR LIQUID DIET   Foods Allowed                                                                     Foods Excluded  Coffee and tea, regular and decaf                             liquids that you cannot  Plain Jell-O in any flavor                                             see through such as: Fruit ices (not with fruit pulp)                                     milk, soups, orange juice  Iced Popsicles                                    All solid food Carbonated beverages, regular and diet                                    Cranberry, grape and apple juices Sports drinks like Gatorade Lightly seasoned clear broth or consume(fat free) Sugar, honey syrup  Sample Menu Breakfast                                Lunch                                     Supper Cranberry juice  Beef broth                            Chicken broth Jell-O                                     Grape juice                           Apple juice Coffee or tea                        Jell-O                                      Popsicle                                                Coffee or tea                        Coffee or  tea  _____________________________________________________________________               BRUSH YOUR TEETH MORNING OF SURGERY AND RINSE YOUR MOUTH OUT, NO CHEWING GUM CANDY OR MINTS.     Take these medicines the morning of surgery with A SIP OF WATER: Amlodipine (Norvasc)                               You may not have any metal on your body including hair pins and              piercings  Do not wear jewelry, make-up, lotions, powders or perfumes, deodorant             Do not wear nail polish.  Do not shave  48 hours prior to surgery.               Do not bring valuables to the hospital. [place].  Contacts, dentures or bridgework may not be worn into surgery.  Leave suitcase in the car. After surgery it may be brought to your room.                   Please read over the following fact sheets you were given: _____________________________________________________________________             [facility] - Preparing for Surgery Before surgery, you can play an important role.  Because skin is not sterile, your skin needs to be as free of germs as possible.  You can reduce the number of germs on your skin by washing with CHG (chlorahexidine gluconate) soap before surgery.  CHG is an antiseptic cleaner which kills germs and bonds with the skin to continue killing germs even after washing. Please DO NOT use if you have an allergy to CHG or antibacterial soaps.  If your skin becomes reddened/irritated stop using the CHG and inform your nurse when you arrive at Short  Stay. Do not shave (including legs and underarms) for at least 48 hours prior to the first CHG shower.  You may shave your face/neck. Please follow these instructions carefully:  1.  Shower with CHG Soap the night before surgery and the  morning of Surgery.  2.  If you choose to wash your hair, wash your hair first as usual with your  normal  shampoo.  3.  After you shampoo, rinse  your hair and body thoroughly to remove the  shampoo.                           4.  Use CHG as you would any other liquid soap.  You can apply chg directly  to the skin and wash                       Gently with a scrungie or clean washcloth.  5.  Apply the CHG Soap to your body ONLY FROM THE NECK DOWN.   Do not use on face/ open                           Wound or open sores. Avoid contact with eyes, ears mouth and genitals (private parts).                       Wash face,  Genitals (private parts) with your normal soap.             6.  Wash thoroughly, paying special attention to the area where your surgery  will be performed.  7.  Thoroughly rinse your body with warm water from the neck down.  8.  DO NOT shower/wash with your normal soap after using and rinsing off  the CHG Soap.                9.  Pat yourself dry with a clean towel.            10.  Wear clean pajamas.            11.  Place clean sheets on your bed the night of your first shower and do not  sleep with pets. Day of Surgery : Do not apply any lotions/deodorants the morning of surgery.  Please wear clean clothes to the hospital/surgery center.  FAILURE TO FOLLOW THESE INSTRUCTIONS MAY RESULT IN THE CANCELLATION OF YOUR SURGERY PATIENT SIGNATURE_________________________________  NURSE SIGNATURE__________________________________  ________________________________________________________________________   [name]  An incentive spirometer is a tool that can help keep your lungs clear and active. This tool measures how well you are filling your lungs with each breath. Taking long deep breaths may help reverse or decrease the chance of developing breathing (pulmonary) problems (especially infection) following:  A long period of time when you are unable to move or be active. BEFORE THE PROCEDURE   If the spirometer includes an indicator to show your best effort, your nurse or respiratory therapist will set it to a  desired goal.  If possible, sit up straight or lean slightly forward. Try not to slouch.  Hold the incentive spirometer in an upright position. INSTRUCTIONS FOR USE  1. Sit on the edge of your bed if possible, or sit up as far as you can in bed or on a chair. 2. Hold the incentive spirometer in an upright position. 3. Breathe out normally. 4. Place the  mouthpiece in your mouth and seal your lips tightly around it. 5. Breathe in slowly and as deeply as possible, raising the piston or the ball toward the top of the column. 6. Hold your breath for 3-5 seconds or for as long as possible. Allow the piston or ball to fall to the bottom of the column. 7. Remove the mouthpiece from your mouth and breathe out normally. 8. Rest for a few seconds and repeat Steps 1 through 7 at least 10 times every 1-2 hours when you are awake. Take your time and take a few normal breaths between deep breaths. 9. The spirometer may include an indicator to show your best effort. Use the indicator as a goal to work toward during each repetition. 10. After each set of 10 deep breaths, practice coughing to be sure your lungs are clear. If you have an incision (the cut made at the time of surgery), support your incision when coughing by placing a pillow or rolled up towels firmly against it. Once you are able to get out of bed, walk around indoors and cough well. You may stop using the incentive spirometer when instructed by your caregiver.  RISKS AND COMPLICATIONS  Take your time so you do not get dizzy or light-headed.  If you are in pain, you may need to take or ask for pain medication before doing incentive spirometry. It is harder to take a deep breath if you are having pain. AFTER USE  Rest and breathe slowly and easily.  It can be helpful to keep track of a log of your progress. Your caregiver can provide you with a simple table to help with this. If you are using the spirometer at home, follow these  instructions: [place] IF:   You are having difficultly using the spirometer.  You have trouble using the spirometer as often as instructed.  Your pain medication is not giving enough relief while using the spirometer.  You develop fever of 100.5 F (38.1 C) or higher. SEEK IMMEDIATE MEDICAL CARE IF:   You cough up bloody sputum that had not been present before.  You develop fever of 102 F (38.9 C) or greater.  You develop worsening pain at or near the incision site. MAKE SURE YOU:   Understand these instructions.  Will watch your condition.  Will get help right away if you are not doing well or get worse. Document Released: 03/23/2007 Document Revised: 02/02/2012 Document Reviewed: 05/24/2007 Exit[employer] Patient Information 2014 [employer], [place].   ________________________________________________________________________  WHAT IS A BLOOD TRANSFUSION? Blood Transfusion Information  A transfusion is the replacement of blood or some of its parts. Blood is made up of multiple cells which provide different functions.  Red blood cells carry oxygen and are used for blood loss replacement.  White blood cells fight against infection.  Platelets control bleeding.  Plasma helps clot blood.  Other blood products are available for specialized needs, such as hemophilia or other clotting disorders. BEFORE THE TRANSFUSION  Who gives blood for transfusions?   Healthy volunteers who are fully evaluated to make sure their blood is safe. This is blood bank blood. Transfusion therapy is the safest it has ever been in the practice of medicine. Before blood is taken from a donor, a complete history is taken to make sure that person has no history of diseases nor engages in risky social behavior (examples are intravenous drug use or sexual activity with multiple partners). The donor's travel history is screened to minimize  risk of transmitting infections, such as malaria. The donated  blood is tested for signs of infectious diseases, such as HIV and hepatitis. The blood is then tested to be sure it is compatible with you in order to minimize the chance of a transfusion reaction. If you or a relative donates blood, this is often done in anticipation of surgery and is not appropriate for emergency situations. It takes many days to process the donated blood. RISKS AND COMPLICATIONS Although transfusion therapy is very safe and saves many lives, the main dangers of transfusion include:   Getting an infectious disease.  Developing a transfusion reaction. This is an allergic reaction to something in the blood you were given. Every precaution is taken to prevent this. The decision to have a blood transfusion has been considered carefully by your caregiver before blood is given. Blood is not given unless the benefits outweigh the risks. AFTER THE TRANSFUSION  Right after receiving a blood transfusion, you will usually feel much better and more energetic. This is especially true if your red blood cells have gotten low (anemic). The transfusion raises the level of the red blood cells which carry oxygen, and this usually causes an energy increase.  The nurse administering the transfusion will monitor you carefully for complications. HOME CARE INSTRUCTIONS  No special instructions are needed after a transfusion. You may find your energy is better. Speak with your caregiver about any limitations on activity for underlying diseases you may have. SEEK MEDICAL CARE IF:   Your condition is not improving after your transfusion.  You develop redness or irritation at the intravenous (IV) site. SEEK IMMEDIATE MEDICAL CARE IF:  Any of the following symptoms occur over the next 12 hours:  Shaking chills.  You have a temperature by mouth above 102 F (38.9 C), not controlled by medicine.  Chest, back, or muscle pain.  People around you feel you are not acting correctly or are  confused.  Shortness of breath or difficulty breathing.  Dizziness and fainting.  You get a rash or develop hives.  You have a decrease in urine output.  Your urine turns a dark color or changes to pink, red, or brown. Any of the following symptoms occur over the next 10 days:  You have a temperature by mouth above 102 F (38.9 C), not controlled by medicine.  Shortness of breath.  Weakness after normal activity.  The white part of the eye turns yellow (jaundice).  You have a decrease in the amount of urine or are urinating less often.  Your urine turns a dark color or changes to pink, red, or brown. Document Released: 11/07/2000 Document Revised: 02/02/2012 Document Reviewed: 06/26/2008 [facility] Patient Information 2014 [place], [place].  _______________________________________________________________________

## 2019-04-28 ENCOUNTER — Encounter (HOSPITAL_COMMUNITY): Payer: Self-pay

## 2019-04-28 ENCOUNTER — Other Ambulatory Visit (HOSPITAL_COMMUNITY)
Admission: RE | Admit: 2019-04-28 | Discharge: 2019-04-28 | Disposition: A | Discharge status: Home / Self Care | Payer: Medicare Other | Source: Ambulatory Visit | Attending: Orthopedic Surgery | Admitting: Orthopedic Surgery

## 2019-04-28 ENCOUNTER — Encounter (HOSPITAL_COMMUNITY)
Admission: RE | Admit: 2019-04-28 | Discharge: 2019-04-28 | Disposition: A | Discharge status: Home / Self Care | Payer: Medicare Other | Source: Ambulatory Visit | Attending: Orthopedic Surgery | Admitting: Orthopedic Surgery

## 2019-04-28 ENCOUNTER — Other Ambulatory Visit: Payer: Self-pay

## 2019-04-28 DIAGNOSIS — Z1159 Encounter for screening for other viral diseases: Secondary | ICD-10-CM | POA: Insufficient documentation

## 2019-04-28 DIAGNOSIS — Z01818 Encounter for other preprocedural examination: Secondary | ICD-10-CM | POA: Insufficient documentation

## 2019-04-28 HISTORY — DX: Personal history of diseases of the blood and blood-forming organs and certain disorders involving the immune mechanism: Z86.2

## 2019-04-28 HISTORY — DX: Personal history of other diseases of the digestive system: Z87.19

## 2019-04-28 LAB — CBC
HCT: 44.3 % (ref 36.0–46.0)
Hemoglobin: 14.9 g/dL (ref 12.0–15.0)
MCH: 28.3 pg (ref 26.0–34.0)
MCHC: 33.6 g/dL (ref 30.0–36.0)
MCV: 84.1 fL (ref 80.0–100.0)
Platelets: 181 10*3/uL (ref 150–400)
RBC: 5.27 MIL/uL — ABNORMAL HIGH (ref 3.87–5.11)
RDW: 13.4 % (ref 11.5–15.5)
WBC: 8.1 10*3/uL (ref 4.0–10.5)
nRBC: 0 % (ref 0.0–0.2)

## 2019-04-28 LAB — COMPREHENSIVE METABOLIC PANEL
ALT: 17 U/L (ref 0–44)
AST: 23 U/L (ref 15–41)
Albumin: 4.4 g/dL (ref 3.5–5.0)
Alkaline Phosphatase: 69 U/L (ref 38–126)
Anion gap: 8 (ref 5–15)
BUN: 19 mg/dL (ref 8–23)
CO2: 24 mmol/L (ref 22–32)
Calcium: 9.6 mg/dL (ref 8.9–10.3)
Chloride: 105 mmol/L (ref 98–111)
Creatinine, Ser: 0.76 mg/dL (ref 0.44–1.00)
GFR calc Af Amer: >60 mL/min (ref >60)
GFR calc non Af Amer: >60 mL/min (ref >60)
Glucose, Bld: 96 mg/dL (ref 70–99)
Potassium: 3.8 mmol/L (ref 3.5–5.1)
Sodium: 137 mmol/L (ref 135–145)
Total Bilirubin: 0.4 mg/dL (ref 0.3–1.2)
Total Protein: 7.7 g/dL (ref 6.5–8.1)

## 2019-04-28 LAB — PROTIME-INR
INR: 1 (ref 0.8–1.2)
Prothrombin Time: 13.5 seconds (ref 11.4–15.2)

## 2019-04-28 LAB — SURGICAL PCR SCREEN
MRSA, PCR: NEGATIVE
Staphylococcus aureus: POSITIVE — AB

## 2019-04-28 LAB — APTT: aPTT: 34 seconds (ref 24–36)

## 2019-04-29 LAB — NOVEL CORONAVIRUS, NAA (HOSP ORDER, SEND-OUT TO REF LAB; TAT 18-24 HRS): SARS-CoV-2, NAA: NOT DETECTED

## 2019-04-29 NOTE — Progress Notes (Signed)
SPOKE W/   PT. Ashley Ruiz   SCREENING SYMPTOMS OF COVID 19:  COUGH--no  RUNNY NOSE--- no  SORE THROAT---no  NASAL CONGESTION----no  SNEEZING----seasonal allergies- nothing new  SHORTNESS OF BREATH---no  DIFFICULTY BREATHING---no  TEMP >100.0 -----no  UNEXPLAINED BODY ACHES------no  CHILLS --------no   HEADACHES ---------no  LOSS OF SMELL/ TASTE --------no    HAVE YOU OR ANY FAMILY MEMBER TRAVELLED PAST 14 DAYS OUT OF THE   COUNTY---no STATE----no COUNTRY----no  HAVE YOU OR ANY FAMILY MEMBER BEEN EXPOSED TO ANYONE WITH COVID 19?   NO

## 2019-04-29 NOTE — Anesthesia Preprocedure Evaluation (Addendum)
Anesthesia Evaluation  Patient identified by MRN, date of birth, ID band Patient awake    Reviewed: Allergy & Precautions, NPO status , Patient's Chart, lab work & pertinent test results  History of Anesthesia Complications (+) PONV  Airway Mallampati: II  TM Distance: >3 FB Neck ROM: Full    Dental  (+) Edentulous Upper, Edentulous Lower   Pulmonary neg pulmonary ROS, former smoker,    Pulmonary exam normal        Cardiovascular hypertension, Pt. on medications Normal cardiovascular exam  TTE 12/08/18: mild LVH, EF 51-83%, grade 1 diastolic dysfunction, functionally bicuspid AV with mild to moderate stenosis and mild regurgitation, mild MVR    Neuro/Psych TIA (2018)   GI/Hepatic negative GI ROS, Neg liver ROS,   Endo/Other  negative endocrine ROS  Renal/GU negative Renal ROS     Musculoskeletal negative musculoskeletal ROS (+)   Abdominal   Peds  Hematology negative hematology ROS (+)   Anesthesia Other Findings Day of surgery medications reviewed with the patient.  Reproductive/Obstetrics                          Anesthesia Physical Anesthesia Plan  ASA: III  Anesthesia Plan: Spinal   Post-op Pain Management:  [facility] for Post-op pain   Induction:   PONV Risk Score and Plan: 4 or greater and Treatment may vary due to age or medical condition, Ondansetron and Propofol infusion  Airway Management Planned: Natural Airway and Simple Face Mask  Additional Equipment:   Intra-op Plan:   Post-operative Plan:   Informed Consent: I have reviewed the patients History and Physical, chart, labs and discussed the procedure including the risks, benefits and alternatives for the proposed anesthesia with the patient or authorized representative who has indicated his/her understanding and acceptance.     Dental advisory given  Plan Discussed with: CRNA  Anesthesia Plan Comments: (See PAT  note 04/28/2019, Konrad Felix, PA-C)      Anesthesia Quick Evaluation

## 2019-04-29 NOTE — Progress Notes (Signed)
Anesthesia Chart Review   Case:  [id number] Date/Time:  05/02/19 0700   Procedure:  TOTAL KNEE ARTHROPLASTY (Left ) - 31min   Anesthesia type:  Choice   Pre-op diagnosis:  left knee osteoarthritis   Location:  [facility] ROOM 09 / [facility]   Surgeon:  Gaynelle Arabian, MD      DISCUSSION: 85 yo former smoker (2.5 pack years, quit 04/12/85) with h/o PONV, HLD, HTN, TIA, mild to moderate aortic stenosis (Mean gradient (S): 21 mm Hg, Valve area (Vmean): 1.29 cm^2 on echo 12/08/2018), left knee OA scheduled for above procedure 05/02/2019 with Dr. Gaynelle Arabian.   Pt last seen by cardiologist, Dr. Shirlee More, via telemedicine on 03/18/19 for preoperative evaluation.  Per note, "Preoperative cardiology evaluation, from my perspective she is optimized aortic stenosis is not severe symptomatic and has not progressed and she has mild aortic regurgitation.  Hypertension is controlled she did a reading during my visit 137/61 and she will continue her usual cardiac medications no further preoperative evaluation is needed needed and see discussion under history for inpatient care."  Pt can proceed with planned procedure barring acute status change.  VS: BP 133/73   Pulse 61   Temp 37.1 C (Oral)   Resp 16   Ht 5\' 2"  (1.575 m)   Wt 74.8 kg   SpO2 97%   BMI 30.18 kg/m   PROVIDERS: Street, Sharon Mt, MD is PCP  Shirlee More, MD is Cardiologist  LABS: Labs reviewed: Acceptable for surgery. (all labs ordered are listed, but only abnormal results are displayed)  Labs Reviewed  SURGICAL PCR SCREEN - Abnormal; Notable for the following components:      Result Value   Staphylococcus aureus POSITIVE (*)    All other components within normal limits  CBC - Abnormal; Notable for the following components:   RBC 5.27 (*)    All other components within normal limits  APTT  COMPREHENSIVE METABOLIC PANEL  PROTIME-INR  TYPE AND SCREEN     IMAGES:   EKG: 04/28/2019 Rate 70 bpm Sinus rhythm with frequent  Premature ventricular complexes Anteroseptal infarct , age undetermined Abnormal ECG No significant change since last tracing  CV: Echo 12/08/2018 Study Conclusions  - Left ventricle: The cavity size was normal. Wall thickness was   increased in a pattern of mild LVH. Systolic function was   vigorous. The estimated ejection fraction was in the range of 65%   to 70%. Wall motion was normal; there were no regional wall   motion abnormalities. Doppler parameters are consistent with   abnormal left ventricular relaxation (grade 1 diastolic   dysfunction). - Aortic valve: Functionally bicuspid; moderately thickened, mildly   calcified leaflets. Cusp separation was mildly reduced. Valve   mobility was restricted. There was mild to moderate stenosis.   There was mild regurgitation. Mean gradient (S): 21 mm Hg. Peak   gradient (S): 39 mm Hg. VTI ratio of LVOT to aortic valve: 0.48.   Valve area (VTI): 1.52 cm^2. Peak velocity ratio of LVOT to   aortic valve: 0.41. Valve area (Vmax): 1.3 cm^2. Mean velocity   ratio of LVOT to aortic valve: 0.41. Valve area (Vmean): 1.29   cm^2. - Ascending aorta: The ascending aorta was mildly dilated. - Mitral valve: Mildly calcified annulus. There was mild   regurgitation. Valve area by pressure half-time: 2.42 cm^2.  Stress Test 02/13/16  Nuclear stress EF: 73%.  There was no ST segment deviation noted during stress.  The study is normal.  This is a low risk study.   Low risk stress nuclear study with normal perfusion and normal left ventricular regional and global systolic function.  Past Medical History:  Diagnosis Date  . Aortic stenosis    peak/mean pressure gradient 28/16 mmHg by echo at [facility] 12/28/15  . Arthritis    OA AND PAIN BOTH KNEES; SOME PAIN IN RT HIP  . Cancer (HCC)    SKIN CANCER ON LT EAR  . Essential hypertension 02/01/2016  . Heart murmur   . Hip arthritis 02/01/2016  . History of anemia   . History of  gallstones   . Hypercholesteremia   . Hyperlipidemia 02/01/2016  . Hypertension   . PONV (postoperative nausea and vomiting)   . Stroke [facility])    ?   SMALL TIA , PATIENT HAD 1 EPISODE DOUBLE VISION 12/2014  . TIA (transient ischemic attack) 02/09/2017    Past Surgical History:  Procedure Laterality Date  . ABDOMINAL HYSTERECTOMY  ? 2005  . CHOLECYSTECTOMY  ? 1982  . COLONOSCOPY    . TONSILLECTOMY  1943  . TOTAL HIP ARTHROPLASTY Right 03/12/2016   Procedure: RIGHT TOTAL HIP ARTHROPLASTY ANTERIOR APPROACH WITH LEFT KNEE INJECTION;  Surgeon: Gaynelle Arabian, MD;  Location: [facility];  Service: Orthopedics;  Laterality: Right;  . TOTAL KNEE ARTHROPLASTY Right 01/23/2014   Procedure: RIGHT TOTAL KNEE ARTHROPLASTY;  Surgeon: [name], MD;  Location: [facility];  Service: Orthopedics;  Laterality: Right;    MEDICATIONS: . amLODipine (NORVASC) 5 MG tablet  . Biotin 10000 MCG TABS  . clopidogrel (PLAVIX) 75 MG tablet  . Coenzyme Q10 (CO Q 10) 100 MG CAPS  . Ergocalciferol (VITAMIN D2) 10 MCG (400 UNIT) TABS  . ibuprofen (ADVIL,MOTRIN) 200 MG tablet  . latanoprost (XALATAN) 0.005 % ophthalmic solution  . losartan (COZAAR) 100 MG tablet  . simvastatin (ZOCOR) 10 MG tablet  . spironolactone-hydrochlorothiazide (ALDACTAZIDE) 25-25 MG tablet   No current facility-administered medications for this encounter.      Maia Plan  [facility] Pre-Surgical Testing [phone] 04/29/19 9:33 AM

## 2019-05-01 MED ORDER — BUPIVACAINE LIPOSOME 1.3 % IJ SUSP
20.0000 mL | Freq: Once | INTRAMUSCULAR | Status: DC
  Filled 2019-05-01: qty 20

## 2019-05-02 ENCOUNTER — Encounter (HOSPITAL_COMMUNITY): Payer: Self-pay | Admitting: *Deleted

## 2019-05-02 ENCOUNTER — Observation Stay (HOSPITAL_COMMUNITY)
Admission: RE | Admit: 2019-05-02 | Discharge: 2019-05-03 | Disposition: A | Discharge status: Home / Self Care | Payer: Medicare Other | Attending: Orthopedic Surgery | Admitting: Orthopedic Surgery

## 2019-05-02 ENCOUNTER — Inpatient Hospital Stay (HOSPITAL_COMMUNITY): Payer: Medicare Other | Admitting: Physician Assistant

## 2019-05-02 ENCOUNTER — Other Ambulatory Visit: Payer: Self-pay

## 2019-05-02 ENCOUNTER — Encounter (HOSPITAL_COMMUNITY)
Admission: RE | Disposition: A | Discharge status: Home / Self Care | Payer: Self-pay | Source: Home / Self Care | Attending: Orthopedic Surgery

## 2019-05-02 ENCOUNTER — Inpatient Hospital Stay (HOSPITAL_COMMUNITY): Payer: Medicare Other | Admitting: Anesthesiology

## 2019-05-02 DIAGNOSIS — G459 Transient cerebral ischemic attack, unspecified: Secondary | ICD-10-CM | POA: Diagnosis not present

## 2019-05-02 DIAGNOSIS — M1712 Unilateral primary osteoarthritis, left knee: Secondary | ICD-10-CM | POA: Diagnosis not present

## 2019-05-02 DIAGNOSIS — Z6830 Body mass index (BMI) 30.0-30.9, adult: Secondary | ICD-10-CM | POA: Insufficient documentation

## 2019-05-02 DIAGNOSIS — Z8673 Personal history of transient ischemic attack (TIA), and cerebral infarction without residual deficits: Secondary | ICD-10-CM | POA: Insufficient documentation

## 2019-05-02 DIAGNOSIS — E669 Obesity, unspecified: Secondary | ICD-10-CM | POA: Insufficient documentation

## 2019-05-02 DIAGNOSIS — M179 Osteoarthritis of knee, unspecified: Secondary | ICD-10-CM | POA: Diagnosis present

## 2019-05-02 DIAGNOSIS — Z85828 Personal history of other malignant neoplasm of skin: Secondary | ICD-10-CM | POA: Insufficient documentation

## 2019-05-02 DIAGNOSIS — Z96651 Presence of right artificial knee joint: Secondary | ICD-10-CM | POA: Diagnosis not present

## 2019-05-02 DIAGNOSIS — Z87891 Personal history of nicotine dependence: Secondary | ICD-10-CM | POA: Diagnosis not present

## 2019-05-02 DIAGNOSIS — E7800 Pure hypercholesterolemia, unspecified: Secondary | ICD-10-CM | POA: Diagnosis not present

## 2019-05-02 DIAGNOSIS — Z96641 Presence of right artificial hip joint: Secondary | ICD-10-CM | POA: Diagnosis not present

## 2019-05-02 DIAGNOSIS — E785 Hyperlipidemia, unspecified: Secondary | ICD-10-CM | POA: Insufficient documentation

## 2019-05-02 DIAGNOSIS — Z79899 Other long term (current) drug therapy: Secondary | ICD-10-CM | POA: Insufficient documentation

## 2019-05-02 DIAGNOSIS — G8918 Other acute postprocedural pain: Secondary | ICD-10-CM | POA: Diagnosis not present

## 2019-05-02 DIAGNOSIS — I10 Essential (primary) hypertension: Secondary | ICD-10-CM | POA: Diagnosis not present

## 2019-05-02 DIAGNOSIS — Z7902 Long term (current) use of antithrombotics/antiplatelets: Secondary | ICD-10-CM | POA: Insufficient documentation

## 2019-05-02 DIAGNOSIS — M1710 Unilateral primary osteoarthritis, unspecified knee: Secondary | ICD-10-CM | POA: Diagnosis present

## 2019-05-02 HISTORY — PX: TOTAL KNEE ARTHROPLASTY: SHX125

## 2019-05-02 LAB — TYPE AND SCREEN
ABO/RH(D): O NEG
Antibody Screen: NEGATIVE

## 2019-05-02 SURGERY — ARTHROPLASTY, KNEE, TOTAL
Anesthesia: Spinal | Site: Knee | Laterality: Left

## 2019-05-02 MED ORDER — LATANOPROST 0.005 % OP SOLN
1.0000 [drp] | Freq: Every day | OPHTHALMIC | Status: DC
  Administered 2019-05-02: 1 [drp] via OPHTHALMIC
  Filled 2019-05-02: qty 2.5

## 2019-05-02 MED ORDER — CEFAZOLIN SODIUM-DEXTROSE 2-4 GM/100ML-% IV SOLN
2.0000 g | INTRAVENOUS | Status: AC
  Administered 2019-05-02: 2 g via INTRAVENOUS
  Filled 2019-05-02: qty 100

## 2019-05-02 MED ORDER — POLYETHYLENE GLYCOL 3350 17 G PO PACK: 17.0000 g | PACK | Freq: Every day | ORAL | Status: DC | PRN

## 2019-05-02 MED ORDER — BUPIVACAINE LIPOSOME 1.3 % IJ SUSP
INTRAMUSCULAR | Status: DC | PRN
  Administered 2019-05-02: 20 mL

## 2019-05-02 MED ORDER — FLEET ENEMA 7-19 GM/118ML RE ENEM: 1.0000 | ENEMA | Freq: Once | RECTAL | Status: DC | PRN

## 2019-05-02 MED ORDER — DEXAMETHASONE SODIUM PHOSPHATE 10 MG/ML IJ SOLN: 8.0000 mg | Freq: Once | INTRAMUSCULAR | Status: DC

## 2019-05-02 MED ORDER — ONDANSETRON HCL 4 MG/2ML IJ SOLN: 4.0000 mg | Freq: Four times a day (QID) | INTRAMUSCULAR | Status: DC | PRN

## 2019-05-02 MED ORDER — DEXAMETHASONE SODIUM PHOSPHATE 10 MG/ML IJ SOLN
INTRAMUSCULAR | Status: DC | PRN
  Administered 2019-05-02: 8 mg via INTRAVENOUS

## 2019-05-02 MED ORDER — DEXAMETHASONE SODIUM PHOSPHATE 10 MG/ML IJ SOLN
10.0000 mg | Freq: Once | INTRAMUSCULAR | Status: AC
  Administered 2019-05-03: 10 mg via INTRAVENOUS
  Filled 2019-05-02: qty 1

## 2019-05-02 MED ORDER — METOCLOPRAMIDE HCL 5 MG/ML IJ SOLN: 5.0000 mg | Freq: Three times a day (TID) | INTRAMUSCULAR | Status: DC | PRN

## 2019-05-02 MED ORDER — SODIUM CHLORIDE 0.9 % IR SOLN
Status: DC | PRN
  Administered 2019-05-02: 1000 mL

## 2019-05-02 MED ORDER — MIDAZOLAM HCL 5 MG/5ML IJ SOLN
INTRAMUSCULAR | Status: DC | PRN
  Administered 2019-05-02: 1 mg via INTRAVENOUS

## 2019-05-02 MED ORDER — FENTANYL CITRATE (PF) 100 MCG/2ML IJ SOLN: 25.0000 ug | INTRAMUSCULAR | Status: DC | PRN

## 2019-05-02 MED ORDER — AMLODIPINE BESYLATE 5 MG PO TABS
5.0000 mg | ORAL_TABLET | Freq: Every day | ORAL | Status: DC
  Administered 2019-05-03: 5 mg via ORAL
  Filled 2019-05-02: qty 1

## 2019-05-02 MED ORDER — ACETAMINOPHEN 10 MG/ML IV SOLN: 1000.0000 mg | Freq: Once | INTRAVENOUS | Status: DC | PRN

## 2019-05-02 MED ORDER — LACTATED RINGERS IV SOLN
INTRAVENOUS | Status: DC
  Administered 2019-05-02 (×2): via INTRAVENOUS

## 2019-05-02 MED ORDER — HYDROCODONE-ACETAMINOPHEN 5-325 MG PO TABS
1.0000 | ORAL_TABLET | ORAL | Status: DC | PRN
  Administered 2019-05-02 – 2019-05-03 (×3): 1 via ORAL
  Filled 2019-05-02 (×3): qty 1

## 2019-05-02 MED ORDER — ONDANSETRON HCL 4 MG/2ML IJ SOLN
INTRAMUSCULAR | Status: AC
  Filled 2019-05-02: qty 2

## 2019-05-02 MED ORDER — ONDANSETRON HCL 4 MG/2ML IJ SOLN
INTRAMUSCULAR | Status: DC | PRN
  Administered 2019-05-02: 4 mg via INTRAVENOUS

## 2019-05-02 MED ORDER — PROPOFOL 500 MG/50ML IV EMUL
INTRAVENOUS | Status: DC | PRN
  Administered 2019-05-02: 50 ug/kg/min via INTRAVENOUS

## 2019-05-02 MED ORDER — PROPOFOL 10 MG/ML IV BOLUS
INTRAVENOUS | Status: AC
  Filled 2019-05-02: qty 40

## 2019-05-02 MED ORDER — LOSARTAN POTASSIUM 50 MG PO TABS
100.0000 mg | ORAL_TABLET | Freq: Every morning | ORAL | Status: DC
  Administered 2019-05-03: 100 mg via ORAL
  Filled 2019-05-02: qty 2

## 2019-05-02 MED ORDER — ACETAMINOPHEN 10 MG/ML IV SOLN
1000.0000 mg | Freq: Four times a day (QID) | INTRAVENOUS | Status: DC
  Administered 2019-05-02: 1000 mg via INTRAVENOUS
  Filled 2019-05-02: qty 100

## 2019-05-02 MED ORDER — EPHEDRINE 5 MG/ML INJ
INTRAVENOUS | Status: AC
  Filled 2019-05-02: qty 10

## 2019-05-02 MED ORDER — DEXAMETHASONE SODIUM PHOSPHATE 10 MG/ML IJ SOLN
INTRAMUSCULAR | Status: AC
  Filled 2019-05-02: qty 1

## 2019-05-02 MED ORDER — PHENYLEPHRINE HCL-NACL 10-0.9 MG/250ML-% IV SOLN
INTRAVENOUS | Status: AC
  Filled 2019-05-02: qty 250

## 2019-05-02 MED ORDER — TRANEXAMIC ACID-NACL 1000-0.7 MG/100ML-% IV SOLN
1000.0000 mg | INTRAVENOUS | Status: AC
  Administered 2019-05-02: 1000 mg via INTRAVENOUS
  Filled 2019-05-02: qty 100

## 2019-05-02 MED ORDER — PHENOL 1.4 % MT LIQD
1.0000 | OROMUCOSAL | Status: DC | PRN
  Filled 2019-05-02: qty 177

## 2019-05-02 MED ORDER — DIPHENHYDRAMINE HCL 50 MG/ML IJ SOLN
INTRAMUSCULAR | Status: AC
  Filled 2019-05-02: qty 1

## 2019-05-02 MED ORDER — MIDAZOLAM HCL 2 MG/2ML IJ SOLN
INTRAMUSCULAR | Status: AC
  Filled 2019-05-02: qty 2

## 2019-05-02 MED ORDER — MORPHINE SULFATE (PF) 2 MG/ML IV SOLN: 1.0000 mg | INTRAVENOUS | Status: DC | PRN

## 2019-05-02 MED ORDER — OXYCODONE HCL 5 MG/5ML PO SOLN: 5.0000 mg | Freq: Once | ORAL | Status: DC | PRN

## 2019-05-02 MED ORDER — SODIUM CHLORIDE (PF) 0.9 % IJ SOLN
INTRAMUSCULAR | Status: AC
  Filled 2019-05-02: qty 100

## 2019-05-02 MED ORDER — CEFAZOLIN SODIUM-DEXTROSE 1-4 GM/50ML-% IV SOLN
1.0000 g | Freq: Four times a day (QID) | INTRAVENOUS | Status: AC
  Administered 2019-05-02 (×2): 1 g via INTRAVENOUS
  Filled 2019-05-02 (×2): qty 50

## 2019-05-02 MED ORDER — LACTATED RINGERS IV SOLN: INTRAVENOUS | Status: DC

## 2019-05-02 MED ORDER — TRAMADOL HCL 50 MG PO TABS
50.0000 mg | ORAL_TABLET | Freq: Four times a day (QID) | ORAL | Status: DC | PRN
  Administered 2019-05-02 – 2019-05-03 (×3): 100 mg via ORAL
  Filled 2019-05-02 (×3): qty 2

## 2019-05-02 MED ORDER — CHLORHEXIDINE GLUCONATE 4 % EX LIQD: 60.0000 mL | Freq: Once | CUTANEOUS | Status: DC

## 2019-05-02 MED ORDER — CLOPIDOGREL BISULFATE 75 MG PO TABS
75.0000 mg | ORAL_TABLET | Freq: Every day | ORAL | Status: DC
  Administered 2019-05-03: 75 mg via ORAL
  Filled 2019-05-02: qty 1

## 2019-05-02 MED ORDER — SPIRONOLACTONE-HCTZ 25-25 MG PO TABS
1.0000 | ORAL_TABLET | ORAL | Status: DC
  Filled 2019-05-02: qty 1

## 2019-05-02 MED ORDER — BUPIVACAINE IN DEXTROSE 0.75-8.25 % IT SOLN
INTRATHECAL | Status: DC | PRN
  Administered 2019-05-02: 1.6 mL via INTRATHECAL

## 2019-05-02 MED ORDER — SODIUM CHLORIDE 0.9 % IV SOLN
INTRAVENOUS | Status: DC | PRN
  Administered 2019-05-02: 50 ug/min via INTRAVENOUS

## 2019-05-02 MED ORDER — ONDANSETRON HCL 4 MG PO TABS: 4.0000 mg | ORAL_TABLET | Freq: Four times a day (QID) | ORAL | Status: DC | PRN

## 2019-05-02 MED ORDER — PROMETHAZINE HCL 25 MG/ML IJ SOLN: 6.2500 mg | INTRAMUSCULAR | Status: DC | PRN

## 2019-05-02 MED ORDER — DIPHENHYDRAMINE HCL 50 MG/ML IJ SOLN
INTRAMUSCULAR | Status: DC | PRN
  Administered 2019-05-02: 12.5 mg via INTRAVENOUS

## 2019-05-02 MED ORDER — SIMVASTATIN 20 MG PO TABS
10.0000 mg | ORAL_TABLET | Freq: Every day | ORAL | Status: DC
  Administered 2019-05-02: 10 mg via ORAL
  Filled 2019-05-02: qty 1

## 2019-05-02 MED ORDER — FENTANYL CITRATE (PF) 100 MCG/2ML IJ SOLN
INTRAMUSCULAR | Status: AC
  Filled 2019-05-02: qty 2

## 2019-05-02 MED ORDER — OXYCODONE HCL 5 MG PO TABS: 5.0000 mg | ORAL_TABLET | Freq: Once | ORAL | Status: DC | PRN

## 2019-05-02 MED ORDER — SODIUM CHLORIDE 0.9 % IV SOLN
INTRAVENOUS | Status: DC
  Administered 2019-05-02 – 2019-05-03 (×2): via INTRAVENOUS

## 2019-05-02 MED ORDER — BUPIVACAINE-EPINEPHRINE (PF) 0.5% -1:200000 IJ SOLN
INTRAMUSCULAR | Status: DC | PRN
  Administered 2019-05-02: 15 mL via PERINEURAL

## 2019-05-02 MED ORDER — METHOCARBAMOL 500 MG PO TABS
500.0000 mg | ORAL_TABLET | Freq: Four times a day (QID) | ORAL | Status: DC | PRN
  Administered 2019-05-02: 500 mg via ORAL
  Filled 2019-05-02: qty 1

## 2019-05-02 MED ORDER — DIPHENHYDRAMINE HCL 12.5 MG/5ML PO ELIX: 12.5000 mg | ORAL_SOLUTION | ORAL | Status: DC | PRN

## 2019-05-02 MED ORDER — METOCLOPRAMIDE HCL 5 MG PO TABS: 5.0000 mg | ORAL_TABLET | Freq: Three times a day (TID) | ORAL | Status: DC | PRN

## 2019-05-02 MED ORDER — EPHEDRINE SULFATE-NACL 50-0.9 MG/10ML-% IV SOSY
PREFILLED_SYRINGE | INTRAVENOUS | Status: DC | PRN
  Administered 2019-05-02: 10 mg via INTRAVENOUS

## 2019-05-02 MED ORDER — BISACODYL 10 MG RE SUPP: 10.0000 mg | Freq: Every day | RECTAL | Status: DC | PRN

## 2019-05-02 MED ORDER — FENTANYL CITRATE (PF) 100 MCG/2ML IJ SOLN
INTRAMUSCULAR | Status: DC | PRN
  Administered 2019-05-02 (×2): 50 ug via INTRAVENOUS

## 2019-05-02 MED ORDER — MENTHOL 3 MG MT LOZG: 1.0000 | LOZENGE | OROMUCOSAL | Status: DC | PRN

## 2019-05-02 MED ORDER — DOCUSATE SODIUM 100 MG PO CAPS
100.0000 mg | ORAL_CAPSULE | Freq: Two times a day (BID) | ORAL | Status: DC
  Administered 2019-05-02 – 2019-05-03 (×2): 100 mg via ORAL
  Filled 2019-05-02 (×2): qty 1

## 2019-05-02 MED ORDER — METHOCARBAMOL 500 MG IVPB - SIMPLE MED
500.0000 mg | Freq: Four times a day (QID) | INTRAVENOUS | Status: DC | PRN
  Filled 2019-05-02: qty 50

## 2019-05-02 MED ORDER — PROPOFOL 10 MG/ML IV BOLUS
INTRAVENOUS | Status: DC | PRN
  Administered 2019-05-02: 20 mg via INTRAVENOUS

## 2019-05-02 MED ORDER — SODIUM CHLORIDE (PF) 0.9 % IJ SOLN
INTRAMUSCULAR | Status: DC | PRN
  Administered 2019-05-02: 60 mL via INTRAVENOUS

## 2019-05-02 MED ORDER — ASPIRIN EC 325 MG PO TBEC
325.0000 mg | DELAYED_RELEASE_TABLET | Freq: Every day | ORAL | Status: DC
  Administered 2019-05-03: 325 mg via ORAL
  Filled 2019-05-02: qty 1

## 2019-05-02 SURGICAL SUPPLY — 60 items
BAG ZIPLOCK 12X15 (MISCELLANEOUS) ×3 IMPLANT
BANDAGE ACE 6X5 VEL STRL LF (GAUZE/BANDAGES/DRESSINGS) ×3 IMPLANT
BLADE SAG 18X100X1.27 (BLADE) ×3 IMPLANT
BLADE SAW SGTL 11.0X1.19X90.0M (BLADE) ×3 IMPLANT
BLADE SURG SZ10 CARB STEEL (BLADE) ×6 IMPLANT
BNDG ELASTIC 6X15 VLCR STRL LF (GAUZE/BANDAGES/DRESSINGS) ×3 IMPLANT
BOWL SMART MIX CTS (DISPOSABLE) ×3 IMPLANT
CEMENT HV SMART SET (Cement) ×6 IMPLANT
CEMENT TIBIA MBT (Knees) ×1 IMPLANT
CLOSURE WOUND 1/2 X4 (GAUZE/BANDAGES/DRESSINGS) ×2
COVER SURGICAL LIGHT HANDLE (MISCELLANEOUS) ×3 IMPLANT
COVER WAND RF STERILE (DRAPES) IMPLANT
CUFF TOURN SGL QUICK 34 (TOURNIQUET CUFF) ×2
CUFF TRNQT CYL 34X4.125X (TOURNIQUET CUFF) ×1 IMPLANT
DECANTER SPIKE VIAL GLASS SM (MISCELLANEOUS) ×3 IMPLANT
DRAPE U-SHAPE 47X51 STRL (DRAPES) ×3 IMPLANT
DRSG ADAPTIC 3X8 NADH LF (GAUZE/BANDAGES/DRESSINGS) ×3 IMPLANT
DRSG PAD ABDOMINAL 8X10 ST (GAUZE/BANDAGES/DRESSINGS) ×3 IMPLANT
DURAPREP 26ML APPLICATOR (WOUND CARE) ×3 IMPLANT
ELECT REM PT RETURN 15FT ADLT (MISCELLANEOUS) ×3 IMPLANT
EVACUATOR 1/8 PVC DRAIN (DRAIN) ×3 IMPLANT
GAUZE SPONGE 4X4 12PLY STRL (GAUZE/BANDAGES/DRESSINGS) ×3 IMPLANT
GLOVE BIO SURGEON STRL SZ7 (GLOVE) ×3 IMPLANT
GLOVE BIO SURGEON STRL SZ8 (GLOVE) ×3 IMPLANT
GLOVE BIOGEL PI IND STRL 6.5 (GLOVE) ×1 IMPLANT
GLOVE BIOGEL PI IND STRL 7.0 (GLOVE) ×1 IMPLANT
GLOVE BIOGEL PI IND STRL 8 (GLOVE) ×1 IMPLANT
GLOVE BIOGEL PI INDICATOR 6.5 (GLOVE) ×2
GLOVE BIOGEL PI INDICATOR 7.0 (GLOVE) ×2
GLOVE BIOGEL PI INDICATOR 8 (GLOVE) ×2
GLOVE SURG SS PI 6.5 STRL IVOR (GLOVE) ×3 IMPLANT
GOWN STRL REUS W/TWL LRG LVL3 (GOWN DISPOSABLE) ×9 IMPLANT
HANDPIECE INTERPULSE COAX TIP (DISPOSABLE) ×2
HOLDER FOLEY CATH W/STRAP (MISCELLANEOUS) IMPLANT
IMMOBILIZER KNEE 20 (SOFTGOODS) ×6
IMMOBILIZER KNEE 20 THIGH 36 (SOFTGOODS) ×2 IMPLANT
IMPL FEMUR SIGMA LT PS SZ 3 (Knees) ×1 IMPLANT
IMPLANT FEMUR SIGMA LT PS SZ 3 (Knees) ×3 IMPLANT
KIT TURNOVER KIT A (KITS) IMPLANT
MANIFOLD NEPTUNE II (INSTRUMENTS) ×3 IMPLANT
NS IRRIG 1000ML POUR BTL (IV SOLUTION) ×3 IMPLANT
PACK TOTAL KNEE CUSTOM (KITS) ×3 IMPLANT
PADDING CAST COTTON 6X4 STRL (CAST SUPPLIES) ×6 IMPLANT
PATELLA DOME PFC 38MM (Knees) ×3 IMPLANT
PIN STEINMAN FIXATION KNEE (PIN) ×3 IMPLANT
PLATE ROT INSERT 12.5MM SIZE 3 (Plate) ×3 IMPLANT
PROTECTOR NERVE ULNAR (MISCELLANEOUS) ×3 IMPLANT
SET HNDPC FAN SPRY TIP SCT (DISPOSABLE) ×1 IMPLANT
SPONGE DRAIN TRACH 4X4 STRL 2S (GAUZE/BANDAGES/DRESSINGS) ×3 IMPLANT
STRIP CLOSURE SKIN 1/2X4 (GAUZE/BANDAGES/DRESSINGS) ×4 IMPLANT
SUT MNCRL AB 4-0 PS2 18 (SUTURE) ×3 IMPLANT
SUT STRATAFIX 0 PDS 27 VIOLET (SUTURE) ×3
SUT VIC AB 2-0 CT1 27 (SUTURE) ×6
SUT VIC AB 2-0 CT1 TAPERPNT 27 (SUTURE) ×3 IMPLANT
SUTURE STRATFX 0 PDS 27 VIOLET (SUTURE) ×1 IMPLANT
TIBIA MBT CEMENT (Knees) ×3 IMPLANT
TRAY FOLEY MTR SLVR 16FR STAT (SET/KITS/TRAYS/PACK) ×3 IMPLANT
WATER STERILE IRR 1000ML POUR (IV SOLUTION) ×6 IMPLANT
WRAP KNEE MAXI GEL POST OP (GAUZE/BANDAGES/DRESSINGS) ×3 IMPLANT
YANKAUER SUCT BULB TIP 10FT TU (MISCELLANEOUS) ×3 IMPLANT

## 2019-05-02 NOTE — Anesthesia Procedure Notes (Signed)
Spinal  Patient location during procedure: OR Start time: 05/02/2019 7:08 AM End time: 05/02/2019 7:10 AM Staffing Anesthesiologist: Brennan Bailey, MD Performed: anesthesiologist  Preanesthetic Checklist Completed: patient identified, surgical consent, pre-op evaluation, timeout performed, IV checked, risks and benefits discussed and monitors and equipment checked Spinal Block Patient position: sitting Prep: site prepped and draped and DuraPrep Patient monitoring: cardiac monitor, continuous pulse ox and blood pressure Approach: right paramedian Location: L3-4 Injection technique: single-shot Needle Needle type: Whitacre  Needle gauge: 22 G Needle length: 9 cm Additional Notes Risks, benefits, and alternative discussed. Patient gave consent to procedure. Prepped and draped in sitting position. Persistent os encountered midline at L3-4. Clear CSF on first pass at L3-4 right paramedian. Positive terminal aspiration. No pain or paraesthesias with injection. Patient tolerated procedure well. Vital signs stable. Tawny Asal, MD

## 2019-05-02 NOTE — Anesthesia Procedure Notes (Addendum)
Date/Time: 05/02/2019 6:43 AM Performed by: Sharlette Dense, CRNA Oxygen Delivery Method: Nasal cannula

## 2019-05-02 NOTE — Anesthesia Procedure Notes (Signed)
Anesthesia Regional Block: Adductor canal block   Pre-Anesthetic Checklist: ,, timeout performed, Correct Patient, Correct Site, Correct Laterality, Correct Procedure, Correct Position, site marked, Risks and benefits discussed, pre-op evaluation,  At surgeon's request and post-op pain management  Laterality: Left  Prep: Maximum Sterile Barrier Precautions used, chloraprep       Needles:  Injection technique: Single-shot  Needle Type: Echogenic Stimulator Needle     Needle Length: 9cm  Needle Gauge: 22     Additional Needles:   Procedures:,,,, ultrasound used (permanent image in chart),,,,  Narrative:  Start time: 05/02/2019 6:49 AM End time: 05/02/2019 6:51 AM Injection made incrementally with aspirations every 5 mL.  Performed by: Personally  Anesthesiologist: Brennan Bailey, MD  Additional Notes: Risks, benefits, and alternative discussed. Patient gave consent for procedure. Patient prepped and draped in sterile fashion. Sedation administered, patient remains easily responsive to voice. Relevant anatomy identified with ultrasound guidance. Local anesthetic given in 5cc increments with no signs or symptoms of intravascular injection. No pain or paraesthesias with injection. Patient monitored throughout procedure with signs of LAST or immediate complications. Tolerated well. Ultrasound image placed in chart.  Tawny Asal, MD

## 2019-05-02 NOTE — Evaluation (Signed)
Physical Therapy Evaluation Patient Details Name: Ashley Ruiz MRN: [id number] DOB: Aug 26, 1929 Today's Date: 05/02/2019   History of Present Illness  s/p L THA; PMH: R TKA, L DA THA  Clinical Impression  Pt is s/p TKA resulting in the deficits listed below (see PT Problem List).  Pt will benefit from skilled PT to increase their independence and safety with mobility to allow discharge to the venue listed below.  Pt should continue to progress well. Planning for possible d/c tomorrow afternoon     Follow Up Recommendations Follow surgeon's recommendation for DC plan and follow-up therapies    Equipment Recommendations  Rolling walker with 5" wheels    Recommendations for Other Services       Precautions / Restrictions Precautions Precautions: Fall;Knee Required Braces or Orthoses: Knee Immobilizer - Left Knee Immobilizer - Left: Discontinue once straight leg raise with < 10 degree lag Restrictions Weight Bearing Restrictions: No Other Position/Activity Restrictions: WBAT      Mobility  Bed Mobility Overal bed mobility: Needs Assistance Bed Mobility: Supine to Sit     Supine to sit: Min guard     General bed mobility comments: for safety  Transfers Overall transfer level: Needs assistance Equipment used: Rolling walker (2 wheeled) Transfers: Sit to/from Stand Sit to Stand: Min guard;Min assist         General transfer comment: cues for hand placement  Ambulation/Gait Ambulation/Gait assistance: Min guard Gait Distance (Feet): 35 Feet Assistive device: Rolling walker (2 wheeled) Gait Pattern/deviations: Step-to pattern     General Gait Details: cues for sequence   Stairs            Wheelchair Mobility    Modified Rankin (Stroke Patients Only)       Balance Overall balance assessment: Mild deficits observed, not formally tested                                           Pertinent Vitals/Pain Pain Assessment: 0-10 Pain  Score: 2  Pain Location: L knee Pain Descriptors / Indicators: Discomfort Pain Intervention(s): Monitored during session;Limited activity within patient's tolerance;Premedicated before session;Repositioned    Home Living Family/patient expects to be discharged to:: Private residence Living Arrangements: Alone Available Help at Discharge: Family Type of Home: House Home Access: Stairs to enter Entrance Stairs-Rails: Right Entrance Stairs-Number of Steps: 3 Home Layout: One level Home Equipment: Environmental consultant - 2 wheels;Cane - single point;Bedside commode      Prior Function Level of Independence: Independent         Comments: sometimes uses cane with uneven surfaces     Hand Dominance        Extremity/Trunk Assessment        Lower Extremity Assessment Lower Extremity Assessment: LLE deficits/detail LLE Deficits / Details: ankle WFL; knee extension and hip flexion 2+/5       Communication   Communication: No difficulties  Cognition Arousal/Alertness: Awake/alert Behavior During Therapy: WFL for tasks assessed/performed Overall Cognitive Status: Within Functional Limits for tasks assessed                                        General Comments      Exercises Total Joint Exercises Ankle Circles/Pumps: AROM;Both;10 reps Quad Sets: AROM;5 reps;Both   Assessment/Plan    PT Assessment  Patient needs continued PT services  PT Problem List Decreased strength;Decreased range of motion;Decreased activity tolerance;Decreased mobility;Decreased knowledge of use of DME       PT Treatment Interventions DME instruction;Functional mobility training;Gait training;Therapeutic exercise;Therapeutic activities;Patient/family education;Stair training    PT Goals (Current goals can be found in the Care Plan section)  Acute Rehab PT Goals Patient Stated Goal: home  PT Goal Formulation: With patient Time For Goal Achievement: 05/16/19 Potential to Achieve Goals:  Good    Frequency 7X/week   Barriers to discharge        Co-evaluation               AM-PAC PT "6 Clicks" Mobility  Outcome Measure Help needed turning from your back to your side while in a flat bed without using bedrails?: A Little Help needed moving from lying on your back to sitting on the side of a flat bed without using bedrails?: A Little Help needed moving to and from a bed to a chair (including a wheelchair)?: A Little Help needed standing up from a chair using your arms (e.g., wheelchair or bedside chair)?: A Little Help needed to walk in hospital room?: A Little Help needed climbing 3-5 steps with a railing? : A Little 6 Click Score: 18    End of Session Equipment Utilized During Treatment: Gait belt Activity Tolerance: Patient tolerated treatment well Patient left: in chair;with call bell/phone within reach;with chair alarm set   PT Visit Diagnosis: Difficulty in walking, not elsewhere classified (R26.2)    Time: 1424-1440 PT Time Calculation (min) (ACUTE ONLY): 16 min   Charges:   PT Evaluation $PT Eval Low Complexity: 1 Low          Kenyon Ana, PT  Pager: [phone] Acute Rehab Dept [facility]): [phone]   05/02/2019   [facility] 05/02/2019, 3:46 PM

## 2019-05-02 NOTE — Transfer of Care (Signed)
Immediate Anesthesia Transfer of Care Note  Patient: Ashley Ruiz  Procedure(s) Performed: TOTAL KNEE ARTHROPLASTY (Left Knee)  Patient Location: PACU  Anesthesia Type:Spinal  Level of Consciousness: awake, alert  and oriented  Airway & Oxygen Therapy: Patient Spontanous Breathing and Patient connected to face mask oxygen  Post-op Assessment: Report given to RN and Post -op Vital signs reviewed and stable  Post vital signs: Reviewed and stable  Last Vitals:  Vitals Value Taken Time  BP    Temp    Pulse 67 05/02/2019  8:29 AM  Resp 12 05/02/2019  8:29 AM  SpO2 100 % 05/02/2019  8:29 AM  Vitals shown include unvalidated device data.  Last Pain:  Vitals:   05/02/19 0612  TempSrc: Oral  PainSc:       Patients Stated Pain Goal: 5 (72/82/06 0156)  Complications: No apparent anesthesia complications

## 2019-05-02 NOTE — Care Plan (Addendum)
Ortho Bundle Case Management Note  Patient Details  Name: ILEANNA GEMMILL MRN: [id number] Date of Birth: [birth year]  L TKA scheduled on 05-02-19 DCP:  Home with family.  1 story home with 3 ste.  DME:  No needs.  Has a RW and 3-in-1. PT:  ProPT.  P/O eval scheduled on 05-05-19.                   DME Arranged:  N/A DME Agency:  NA  HH Arranged:  NA HH Agency:  NA  Additional Comments: Please contact me with any questions of if this plan should need to change.  Marianne Sofia, RN,CCM [facility]  [phone] 05/02/2019, 1:57 PM

## 2019-05-02 NOTE — Anesthesia Postprocedure Evaluation (Signed)
Anesthesia Post Note  Patient: Ashley Ruiz  Procedure(s) Performed: TOTAL KNEE ARTHROPLASTY (Left Knee)     Patient location during evaluation: PACU Anesthesia Type: Spinal Level of consciousness: awake and alert Pain management: pain level controlled Vital Signs Assessment: post-procedure vital signs reviewed and stable Respiratory status: spontaneous breathing, nonlabored ventilation and respiratory function stable Cardiovascular status: blood pressure returned to baseline and stable Postop Assessment: no apparent nausea or vomiting and spinal receding Anesthetic complications: no    Last Vitals:  Vitals:   05/02/19 0915 05/02/19 0945  BP: (!) 107/45 (!) 113/44  Pulse: 67 61  Resp: 14 16  Temp:  36.5 C  SpO2: 100% 97%    Last Pain:  Vitals:   05/02/19 0945  TempSrc: Oral  PainSc:     LLE Motor Response: (P) Purposeful movement (05/02/19 1005) LLE Sensation: (P) No numbness (05/02/19 1005) RLE Motor Response: (P) Purposeful movement (05/02/19 1005)   L Sensory Level: (P) L4-Anterior knee, lower leg (05/02/19 1005) R Sensory Level: (P) S1-Sole of foot, small toes (05/02/19 1005)  Brennan Bailey

## 2019-05-02 NOTE — Op Note (Signed)
OPERATIVE REPORT-TOTAL KNEE ARTHROPLASTY   Pre-operative diagnosis- Osteoarthritis  Left knee(s)  Post-operative diagnosis- Osteoarthritis Left knee(s)  Procedure-  Left  Total Knee Arthroplasty  Surgeon- Dione Plover. Jerimie Mancuso, MD  Assistant- Ardeen Jourdain, PA-C   Anesthesia-  Adductor canal block and spinal  EBL- 25 ml  Drains Hemovac  Tourniquet time- 32 minutes @ 106 mm Hg    Complications- None  Condition-PACU - hemodynamically stable.   Brief Clinical Note  Ashley Ruiz is a 85 y.o. year old female with end stage OA of her left knee with progressively worsening pain and dysfunction. She has constant pain, with activity and at rest and significant functional deficits with difficulties even with ADLs. She has had extensive non-op management including analgesics, injections of cortisone and viscosupplements, and home exercise program, but remains in significant pain with significant dysfunction. Radiographs show bone on bone arthritis medial and patellofemoral. She presents now for left Total Knee Arthroplasty.    Procedure in detail---   The patient is brought into the operating room and positioned supine on the operating table. After successful administration of  Adductor canal block and spinal,   a tourniquet is placed high on the  Left thigh(s) and the lower extremity is prepped and draped in the usual sterile fashion. Time out is performed by the operating team and then the  Left lower extremity is wrapped in Esmarch, knee flexed and the tourniquet inflated to 300 mmHg.       A midline incision is made with a ten blade through the subcutaneous tissue to the level of the extensor mechanism. A fresh blade is used to make a medial parapatellar arthrotomy. Soft tissue over the proximal medial tibia is subperiosteally elevated to the joint line with a knife and into the semimembranosus bursa with a Cobb elevator. Soft tissue over the proximal lateral tibia is elevated with  attention being paid to avoiding the patellar tendon on the tibial tubercle. The patella is everted, knee flexed 90 degrees and the ACL and PCL are removed. Findings are bone o bone medial and patellofemoral with massive global osteophytes.        The drill is used to create a starting hole in the distal femur and the canal is thoroughly irrigated with sterile saline to remove the fatty contents. The 5 degree Left  valgus alignment guide is placed into the femoral canal and the distal femoral cutting block is pinned to remove 10 mm off the distal femur. Resection is made with an oscillating saw.      The tibia is subluxed forward and the menisci are removed. The extramedullary alignment guide is placed referencing proximally at the medial aspect of the tibial tubercle and distally along the second metatarsal axis and tibial crest. The block is pinned to remove 46mm off the more deficient medial  side. Resection is made with an oscillating saw. Size 3is the most appropriate size for the tibia and the proximal tibia is prepared with the modular drill and keel punch for that size.      The femoral sizing guide is placed and size 3 is most appropriate. Rotation is marked off the epicondylar axis and confirmed by creating a rectangular flexion gap at 90 degrees. The size 3 cutting block is pinned in this rotation and the anterior, posterior and chamfer cuts are made with the oscillating saw. The intercondylar block is then placed and that cut is made.      Trial size 3 tibial component, trial size  3 posterior stabilized femur and a 12.5  mm posterior stabilized rotating platform insert trial is placed. Full extension is achieved with excellent varus/valgus and anterior/posterior balance throughout full range of motion. The patella is everted and thickness measured to be 22  mm. Free hand resection is taken to 12 mm, a 38 template is placed, lug holes are drilled, trial patella is placed, and it tracks normally.  Osteophytes are removed off the posterior femur with the trial in place. All trials are removed and the cut bone surfaces prepared with pulsatile lavage. Cement is mixed and once ready for implantation, the size 3 tibial implant, size  3 posterior stabilized femoral component, and the size 38 patella are cemented in place and the patella is held with the clamp. The trial insert is placed and the knee held in full extension. The Exparel (20 ml mixed with 60 ml saline) is injected into the extensor mechanism, posterior capsule, medial and lateral gutters and subcutaneous tissues.  All extruded cement is removed and once the cement is hard the permanent 12.5 mm posterior stabilized rotating platform insert is placed into the tibial tray.      The wound is copiously irrigated with saline solution and the extensor mechanism closed over a hemovac drain with #1 V-loc suture. The tourniquet is released for a total tourniquet time of 32  minutes. Flexion against gravity is 140 degrees and the patella tracks normally. Subcutaneous tissue is closed with 2.0 vicryl and subcuticular with running 4.0 Monocryl. The incision is cleaned and dried and steri-strips and a bulky sterile dressing are applied. The limb is placed into a knee immobilizer and the patient is awakened and transported to recovery in stable condition.      Please note that a surgical assistant was a medical necessity for this procedure in order to perform it in a safe and expeditious manner. Surgical assistant was necessary to retract the ligaments and vital neurovascular structures to prevent injury to them and also necessary for proper positioning of the limb to allow for anatomic placement of the prosthesis.   Dione Plover [place] Kuang, MD    05/02/2019, 8:08 AM

## 2019-05-02 NOTE — Interval H&P Note (Signed)
History and Physical Interval Note:  01/31/4319 0:37 AM  Ashley Ruiz  has presented today for surgery, with the diagnosis of left knee osteoarthritis.  The various methods of treatment have been discussed with the patient and family. After consideration of risks, benefits and other options for treatment, the patient has consented to  Procedure(s) with comments: TOTAL KNEE ARTHROPLASTY (Left) - 109min as a surgical intervention.  The patient's history has been reviewed, patient examined, no change in status, stable for surgery.  I have reviewed the patient's chart and labs.  Questions were answered to the patient's satisfaction.     Pilar Plate Sindia Kowalczyk

## 2019-05-02 NOTE — Discharge Instructions (Signed)
° °Dr. Frank Aluisio °Total Joint Specialist °[facility] °[address] °[place], [place] [address] °[phone] ° °TOTAL KNEE REPLACEMENT POSTOPERATIVE DIRECTIONS ° °Knee Rehabilitation, Guidelines Following Surgery  °Results after knee surgery are often greatly improved when you follow the exercise, range of motion and muscle strengthening exercises prescribed by your doctor. Safety measures are also important to protect the knee from further injury. Any time any of these exercises cause you to have increased pain or swelling in your knee joint, decrease the amount until you are comfortable again and slowly increase them. If you have problems or questions, call your caregiver or physical therapist for advice.  ° °HOME CARE INSTRUCTIONS  °• Remove items at home which could result in a fall. This includes throw rugs or furniture in walking pathways.  °· ICE to the affected knee every three hours for 30 minutes at a time and then as needed for pain and swelling.  Continue to use ice on the knee for pain and swelling from surgery. You may notice swelling that will progress down to the foot and ankle.  This is normal after surgery.  Elevate the leg when you are not up walking on it.   °· Continue to use the breathing machine which will help keep your temperature down.  It is common for your temperature to cycle up and down following surgery, especially at night when you are not up moving around and exerting yourself.  The breathing machine keeps your lungs expanded and your temperature down. °· Do not place pillow under knee, focus on keeping the knee straight while resting ° °DIET °You may resume your previous home diet once your are discharged from the hospital. ° °DRESSING / WOUND CARE / SHOWERING °You may change your dressing 3-5 days after surgery.  Then change the dressing every day with sterile gauze.  Please use good hand washing techniques before changing the dressing.  Do not use any lotions  or creams on the incision until instructed by your surgeon. °You may start showering once you are discharged home but do not submerge the incision under water. Just pat the incision dry and apply a dry gauze dressing on daily. °Change the surgical dressing daily and reapply a dry dressing each time. ° °ACTIVITY °Walk with your walker as instructed. °Use walker as long as suggested by your caregivers. °Avoid periods of inactivity such as sitting longer than an hour when not asleep. This helps prevent blood clots.  °You may resume a sexual relationship in one month or when given the OK by your doctor.  °You may return to work once you are cleared by your doctor.  °Do not drive a car for 6 weeks or until released by you surgeon.  °Do not drive while taking narcotics. ° °WEIGHT BEARING °Weight bearing as tolerated with assist device (walker, cane, etc) as directed, use it as long as suggested by your surgeon or therapist, typically at least 4-6 weeks. ° °POSTOPERATIVE CONSTIPATION PROTOCOL °Constipation - defined medically as fewer than three stools per week and severe constipation as less than one stool per week. ° °One of the most common issues patients have following surgery is constipation.  Even if you have a regular bowel pattern at home, your normal regimen is likely to be disrupted due to multiple reasons following surgery.  Combination of anesthesia, postoperative narcotics, change in appetite and fluid intake all can affect your bowels.  In order to avoid complications following surgery, here are some   recommendations in order to help you during your recovery period. ° °Colace (docusate) - Pick up an over-the-counter form of Colace or another stool softener and take twice a day as long as you are requiring postoperative pain medications.  Take with a full glass of water daily.  If you experience loose stools or diarrhea, hold the colace until you stool forms back up.  If your symptoms do not get better within 1  week or if they get worse, check with your doctor. ° °Dulcolax (bisacodyl) - Pick up over-the-counter and take as directed by the product packaging as needed to assist with the movement of your bowels.  Take with a full glass of water.  Use this product as needed if not relieved by Colace only.  ° °MiraLax (polyethylene glycol) - Pick up over-the-counter to have on hand.  MiraLax is a solution that will increase the amount of water in your bowels to assist with bowel movements.  Take as directed and can mix with a glass of water, juice, soda, coffee, or tea.  Take if you go more than two days without a movement. °Do not use MiraLax more than once per day. Call your doctor if you are still constipated or irregular after using this medication for 7 days in a row. ° °If you continue to have problems with postoperative constipation, please contact the office for further assistance and recommendations.  If you experience "the worst abdominal pain ever" or develop nausea or vomiting, please contact the office immediatly for further recommendations for treatment. ° °ITCHING °If you experience itching with your medications, try taking only a single pain pill, or even half a pain pill at a time.  You can also use Benadryl over the counter for itching or also to help with sleep.  ° °TED HOSE STOCKINGS °Wear the elastic stockings on both legs for three weeks following surgery during the day but you may remove then at night for sleeping. ° °MEDICATIONS °See your medication summary on the “After Visit Summary” that the nursing staff will review with you prior to discharge.  You may have some home medications which will be placed on hold until you complete the course of blood thinner medication.  It is important for you to complete the blood thinner medication as prescribed by your surgeon.  Continue your approved medications as instructed at time of discharge. ° °PRECAUTIONS °If you experience chest pain or shortness of breath -  call 911 immediately for transfer to the hospital emergency department.  °If you develop a fever greater that 101 F, purulent drainage from wound, increased redness or drainage from wound, foul odor from the wound/dressing, or calf pain - CONTACT YOUR SURGEON.   °                                                °FOLLOW-UP APPOINTMENTS °Make sure you keep all of your appointments after your operation with your surgeon and caregivers. You should call the office at the above phone number and make an appointment for approximately two weeks after the date of your surgery or on the date instructed by your surgeon outlined in the "After Visit Summary". ° °RANGE OF MOTION AND STRENGTHENING EXERCISES  °Rehabilitation of the knee is important following a knee injury or an operation. After just a few days of immobilization, the muscles of   the thigh which control the knee become weakened and shrink (atrophy). Knee exercises are designed to build up the tone and strength of the thigh muscles and to improve knee motion. Often times heat used for twenty to thirty minutes before working out will loosen up your tissues and help with improving the range of motion but do not use heat for the first two weeks following surgery. These exercises can be done on a training (exercise) mat, on the floor, on a table or on a bed. Use what ever works the best and is most comfortable for you Knee exercises include:  °• Leg Lifts - While your knee is still immobilized in a splint or cast, you can do straight leg raises. Lift the leg to 60 degrees, hold for 3 sec, and slowly lower the leg. Repeat 10-20 times 2-3 times daily. Perform this exercise against resistance later as your knee gets better.  °• Quad and Hamstring Sets - Tighten up the muscle on the front of the thigh (Quad) and hold for 5-10 sec. Repeat this 10-20 times hourly. Hamstring sets are done by pushing the foot backward against an object and holding for 5-10 sec. Repeat as with quad  sets.  °· Leg Slides: Lying on your back, slowly slide your foot toward your buttocks, bending your knee up off the floor (only go as far as is comfortable). Then slowly slide your foot back down until your leg is flat on the floor again. °· Angel Wings: Lying on your back spread your legs to the side as far apart as you can without causing discomfort.  °A rehabilitation program following serious knee injuries can speed recovery and prevent re-injury in the future due to weakened muscles. Contact your doctor or a physical therapist for more information on knee rehabilitation.  ° °IF YOU ARE TRANSFERRED TO A SKILLED REHAB FACILITY °If the patient is transferred to a skilled rehab facility following release from the hospital, a list of the current medications will be sent to the facility for the patient to continue.  When discharged from the skilled rehab facility, please have the facility set up the patient's Home Health Physical Therapy prior to being released. Also, the skilled facility will be responsible for providing the patient with their medications at time of release from the facility to include their pain medication, the muscle relaxants, and their blood thinner medication. If the patient is still at the rehab facility at time of the two week follow up appointment, the skilled rehab facility will also need to assist the patient in arranging follow up appointment in our office and any transportation needs. ° °MAKE SURE YOU:  °• Understand these instructions.  °• Get help right away if you are not doing well or get worse.  ° ° °Pick up stool softner and laxative for home use following surgery while on pain medications. °Do not submerge incision under water. °Please use good hand washing techniques while changing dressing each day. °May shower starting three days after surgery. °Please use a clean towel to pat the incision dry following showers. °Continue to use ice for pain and swelling after surgery. °Do not  use any lotions or creams on the incision until instructed by your surgeon. ° °

## 2019-05-02 NOTE — Anesthesia Procedure Notes (Signed)
Date/Time: 05/02/2019 7:05 AM Performed by: Sharlette Dense, CRNA Oxygen Delivery Method: Simple face mask

## 2019-05-03 ENCOUNTER — Encounter (HOSPITAL_COMMUNITY): Payer: Self-pay | Admitting: Orthopedic Surgery

## 2019-05-03 DIAGNOSIS — E669 Obesity, unspecified: Secondary | ICD-10-CM | POA: Diagnosis not present

## 2019-05-03 DIAGNOSIS — E785 Hyperlipidemia, unspecified: Secondary | ICD-10-CM | POA: Diagnosis not present

## 2019-05-03 DIAGNOSIS — Z79899 Other long term (current) drug therapy: Secondary | ICD-10-CM | POA: Diagnosis not present

## 2019-05-03 DIAGNOSIS — I10 Essential (primary) hypertension: Secondary | ICD-10-CM | POA: Diagnosis not present

## 2019-05-03 DIAGNOSIS — E7800 Pure hypercholesterolemia, unspecified: Secondary | ICD-10-CM | POA: Diagnosis not present

## 2019-05-03 DIAGNOSIS — M1712 Unilateral primary osteoarthritis, left knee: Secondary | ICD-10-CM | POA: Diagnosis not present

## 2019-05-03 LAB — CBC
HCT: 37.5 % (ref 36.0–46.0)
Hemoglobin: 12.3 g/dL (ref 12.0–15.0)
MCH: 28.1 pg (ref 26.0–34.0)
MCHC: 32.8 g/dL (ref 30.0–36.0)
MCV: 85.6 fL (ref 80.0–100.0)
Platelets: 151 10*3/uL (ref 150–400)
RBC: 4.38 MIL/uL (ref 3.87–5.11)
RDW: 13.9 % (ref 11.5–15.5)
WBC: 12.9 10*3/uL — ABNORMAL HIGH (ref 4.0–10.5)
nRBC: 0 % (ref 0.0–0.2)

## 2019-05-03 LAB — BASIC METABOLIC PANEL
Anion gap: 7 (ref 5–15)
BUN: 16 mg/dL (ref 8–23)
CO2: 22 mmol/L (ref 22–32)
Calcium: 8.5 mg/dL — ABNORMAL LOW (ref 8.9–10.3)
Chloride: 107 mmol/L (ref 98–111)
Creatinine, Ser: 0.65 mg/dL (ref 0.44–1.00)
GFR calc Af Amer: >60 mL/min (ref >60)
GFR calc non Af Amer: >60 mL/min (ref >60)
Glucose, Bld: 113 mg/dL — ABNORMAL HIGH (ref 70–99)
Potassium: 3.8 mmol/L (ref 3.5–5.1)
Sodium: 136 mmol/L (ref 135–145)

## 2019-05-03 MED ORDER — TRAMADOL HCL 50 MG PO TABS: 50.0000 mg | ORAL_TABLET | Freq: Four times a day (QID) | ORAL | 0 refills | Status: DC | PRN

## 2019-05-03 MED ORDER — METHOCARBAMOL 500 MG PO TABS: 500.0000 mg | ORAL_TABLET | Freq: Four times a day (QID) | ORAL | 0 refills | Status: DC | PRN

## 2019-05-03 MED ORDER — HYDROCHLOROTHIAZIDE 25 MG PO TABS
25.0000 mg | ORAL_TABLET | ORAL | Status: DC
  Administered 2019-05-03: 25 mg via ORAL
  Filled 2019-05-03: qty 1

## 2019-05-03 MED ORDER — HYDROCODONE-ACETAMINOPHEN 5-325 MG PO TABS: 1.0000 | ORAL_TABLET | Freq: Four times a day (QID) | ORAL | 0 refills | Status: DC | PRN

## 2019-05-03 MED ORDER — ASPIRIN 325 MG PO TBEC: 325.0000 mg | DELAYED_RELEASE_TABLET | Freq: Every day | ORAL | 0 refills | Status: AC

## 2019-05-03 MED ORDER — SPIRONOLACTONE 25 MG PO TABS
25.0000 mg | ORAL_TABLET | ORAL | Status: DC
  Administered 2019-05-03: 09:00:00 25 mg via ORAL
  Filled 2019-05-03: qty 1

## 2019-05-03 NOTE — Progress Notes (Signed)
Subjective: 1 Day Post-Op Procedure(s) (LRB): TOTAL KNEE ARTHROPLASTY (Left) Patient reports pain as mild.   Patient seen in rounds by Dr. Wynelle Link. Patient is well, and has had no acute complaints or problems other than pain in the left knee. No issues overnight. Denies chest pain, SOB, or calf pain. Foley catheter removed this AM. We will continue therapy today.   Objective: Vital signs in last 24 hours: Temp:  [97.4 F (36.3 C)-98.8 F (37.1 C)] 97.8 F (36.6 C) (06/09 0615) Pulse Rate:  [59-77] 67 (06/09 0615) Resp:  [14-18] 16 (06/09 0615) BP: (99-146)/(37-61) 146/59 (06/09 0615) SpO2:  [94 %-100 %] 95 % (06/09 0615)  Intake/Output from previous day:  Intake/Output Summary (Last 24 hours) at 05/03/2019 0715 Last data filed at 05/03/2019 5329 Gross per 24 hour  Intake 4016.69 ml  Output 2635 ml  Net 1381.69 ml    Labs: Recent Labs    05/03/19 0400  HGB 12.3   Recent Labs    05/03/19 0400  WBC 12.9*  RBC 4.38  HCT 37.5  PLT 151   Recent Labs    05/03/19 0400  NA 136  K 3.8  CL 107  CO2 22  BUN 16  CREATININE 0.65  GLUCOSE 113*  CALCIUM 8.5*   Exam: General - Patient is Alert and Oriented Extremity - Neurologically intact Neurovascular intact Sensation intact distally Dorsiflexion/Plantar flexion intact Dressing - dressing C/D/I Motor Function - intact, moving foot and toes well on exam.   Past Medical History:  Diagnosis Date  . Aortic stenosis    peak/mean pressure gradient 28/16 mmHg by echo at [facility] 12/28/15  . Arthritis    OA AND PAIN BOTH KNEES; SOME PAIN IN RT HIP  . Cancer (HCC)    SKIN CANCER ON LT EAR  . Essential hypertension 02/01/2016  . Heart murmur   . Hip arthritis 02/01/2016  . History of anemia   . History of gallstones   . Hypercholesteremia   . Hyperlipidemia 02/01/2016  . Hypertension   . PONV (postoperative nausea and vomiting)   . Stroke [facility])    ?   SMALL TIA , PATIENT HAD 1 EPISODE DOUBLE VISION 12/2014   . TIA (transient ischemic attack) 02/09/2017    Assessment/Plan: 1 Day Post-Op Procedure(s) (LRB): TOTAL KNEE ARTHROPLASTY (Left) Active Problems:   OA (osteoarthritis) of knee  Estimated body mass index is 30.18 kg/m as calculated from the following:   Height as of this encounter: 5\' 2"  (1.575 m).   Weight as of this encounter: 74.8 kg. Advance diet Up with therapy D/C IV fluids  Anticipated LOS equal to or greater than 2 midnights due to - Age 51 and older with one or more of the following:  - Obesity  - Expected need for hospital services (PT, OT, Nursing) required for safe  discharge  - Anticipated need for postoperative skilled nursing care or inpatient rehab  - Active co-morbidities: Stroke and aortic stenosis OR   - Unanticipated findings during/Post Surgery: None  - Patient is a high risk of re-admission due to: None    DVT Prophylaxis - Aspirin and Plavix Weight bearing as tolerated. D/C O2 and pulse ox and try on room air. Hemovac pulled without difficulty, will continue therapy today.  Plan is to go Home after hospital stay. Plan for discharge later today if progresses with therapy and meeting her goals. Scheduled for outpatient physical therapy at [facility] in [place]. Follow-up in the office in 2 weeks.  Theresa Duty, PA-C Orthopedic Surgery 05/03/2019, 7:15 AM

## 2019-05-03 NOTE — Progress Notes (Signed)
Physical Therapy Treatment Patient Details Name: Ashley Ruiz MRN: [id number] DOB: Aug 19, 1929 Today's Date: 05/03/2019    History of Present Illness s/p L THA; PMH: R TKA, L DA THA    PT Comments    POD # 1 pm session Assisted with amb in hallway.  Practiced stairs.  Stairs: Yes Stairs assistance: Supervision;Min guard Stair Management: One rail Left;Step to pattern;Forwards;With crutches Number of Stairs: 3 General stair comments: while face timing daughter in law, pt practiced stairs 3 steps one rail/one crutch Then returned to room to perform some TE's following HEP handout.  Instructed on proper tech, freq as well as use of ICE.   Addressed all mobility questions, discussed appropriate activity, educated on use of ICE.  Pt ready for D/C to home.    Follow Up Recommendations  Follow surgeon's recommendation for DC plan and follow-up therapies     Equipment Recommendations  Rolling walker with 5" wheels    Recommendations for Other Services       Precautions / Restrictions Precautions Precautions: Fall;Knee Precaution Comments: did not use KI this session Restrictions Weight Bearing Restrictions: No Other Position/Activity Restrictions: WBAT    Mobility  Bed Mobility Overal bed mobility: Needs Assistance Bed Mobility: Supine to Sit     Supine to sit: Min guard     General bed mobility comments: OOB in recliner   Transfers Overall transfer level: Needs assistance Equipment used: Rolling walker (2 wheeled) Transfers: Sit to/from Stand Sit to Stand: Supervision;Min guard         General transfer comment: <25% VC's on hand placement and increased time   Ambulation/Gait Ambulation/Gait assistance: Supervision;Min guard Gait Distance (Feet): 26 Feet Assistive device: Rolling walker (2 wheeled) Gait Pattern/deviations: Step-to pattern Gait velocity: decreased    General Gait Details: <25% VC's on safety with turns and proper walker to self distance     Stairs Stairs: Yes Stairs assistance: Supervision;Min guard Stair Management: One rail Left;Step to pattern;Forwards;With crutches Number of Stairs: 3 General stair comments: while face timing daughter in law, pt practiced stairs 3 steps one rail/one crutch   Wheelchair Mobility    Modified Rankin (Stroke Patients Only)       Balance                                            Cognition Arousal/Alertness: Awake/alert Behavior During Therapy: WFL for tasks assessed/performed Overall Cognitive Status: Within Functional Limits for tasks assessed                                        Exercises      General Comments        Pertinent Vitals/Pain Pain Assessment: 0-10 Pain Score: 5  Pain Location: L knee Pain Descriptors / Indicators: Discomfort;Operative site guarding;Sore Pain Intervention(s): Monitored during session;Repositioned;Premedicated before session;Ice applied    Home Living                      Prior Function            PT Goals (current goals can now be found in the care plan section) Progress towards PT goals: Progressing toward goals    Frequency    7X/week      PT Plan Current plan remains appropriate  Co-evaluation              AM-PAC PT "6 Clicks" Mobility   Outcome Measure  Help needed turning from your back to your side while in a flat bed without using bedrails?: A Little Help needed moving from lying on your back to sitting on the side of a flat bed without using bedrails?: A Little Help needed moving to and from a bed to a chair (including a wheelchair)?: A Little Help needed standing up from a chair using your arms (e.g., wheelchair or bedside chair)?: A Little Help needed to walk in hospital room?: A Little Help needed climbing 3-5 steps with a railing? : A Little 6 Click Score: 18    End of Session Equipment Utilized During Treatment: Gait belt Activity Tolerance:  Patient tolerated treatment well(ready for D/C to home ) Patient left: in chair;with call bell/phone within reach;with chair alarm set Nurse Communication: Mobility status(pt ready for D/C to home) PT Visit Diagnosis: Difficulty in walking, not elsewhere classified (R26.2)     Time: 1425-1450 PT Time Calculation (min) (ACUTE ONLY): 25 min  Charges:  $Gait Training: 8-22 mins $Therapeutic Activity: 8-22 mins                     Rica Koyanagi  PTA Acute  Rehabilitation Services Pager      [phone] Office      [phone]

## 2019-05-03 NOTE — Care Management CC44 (Signed)
Condition Code 44 Documentation Completed  Patient Details  Name: MARRISSA DAI MRN: [id number] Date of Birth: December 22, 1928   Condition Code 44 given:  Yes Patient signature on Condition Code 44 notice:  Yes Documentation of 2 MD's agreement:  Yes Code 44 added to claim:  Yes    Leeroy Cha, RN 05/03/2019, 11:08 AM

## 2019-05-03 NOTE — Progress Notes (Signed)
Physical Therapy Treatment Patient Details Name: Ashley Ruiz MRN: [id number] DOB: [birth year] Today's Date: 05/03/2019    History of Present Illness s/p L THA; PMH: R TKA, L DA THA    PT Comments    POD # 1 am session Assisted OOB to [facility] for a small BM.  Assisted with amb a limited distance due to pain/fatigue.  Pt tolerated well.  Returned to room and performed some TKR TE's followed by ICE.  Pt will need another PT session to address 3 stairs/one LEFT rail. So far looking good for D/C to home later today.    Follow Up Recommendations   OP per MD     Equipment Recommendations       Recommendations for Other Services       Precautions / Restrictions Precautions Precautions: Fall;Knee Precaution Comments: did not use KI this session Restrictions Weight Bearing Restrictions: No Other Position/Activity Restrictions: WBAT    Mobility  Bed Mobility Overal bed mobility: Needs Assistance Bed Mobility: Supine to Sit     Supine to sit: Min guard     General bed mobility comments: for safety and support L LE  Transfers Overall transfer level: Needs assistance Equipment used: Rolling walker (2 wheeled) Transfers: Sit to/from Stand Sit to Stand: Supervision;Min guard         General transfer comment: <25% VC's on hand placement and increased time   Ambulation/Gait Ambulation/Gait assistance: Supervision;Min guard Gait Distance (Feet): 37 Feet Assistive device: Rolling walker (2 wheeled) Gait Pattern/deviations: Step-to pattern Gait velocity: decreased    General Gait Details: <25% VC's on safety with turns and proper walker to self distance    Stairs             Wheelchair Mobility    Modified Rankin (Stroke Patients Only)       Balance                                            Cognition Arousal/Alertness: Awake/alert Behavior During Therapy: WFL for tasks assessed/performed Overall Cognitive Status: Within Functional  Limits for tasks assessed                                        Exercises   20 reps AP 10 reps knee presses and ADD   General Comments        Pertinent Vitals/Pain Pain Assessment: 0-10 Pain Score: 5  Pain Location: L knee Pain Descriptors / Indicators: Discomfort;Operative site guarding Pain Intervention(s): Monitored during session;Repositioned;Ice applied;Premedicated before session    Home Living                      Prior Function            PT Goals (current goals can now be found in the care plan section) Progress towards PT goals: Progressing toward goals    Frequency           PT Plan      Co-evaluation              AM-PAC PT "6 Clicks" Mobility   Outcome Measure                   End of Session Equipment Utilized During Treatment: Gait belt Activity Tolerance:  Patient tolerated treatment well Patient left: in chair;with call bell/phone within reach;with chair alarm set Nurse Communication: Mobility status(pt will need 2 PT sessions.  Looks promising to D/C to home today.  )       Time: 4481-8563 PT Time Calculation (min) (ACUTE ONLY): 25 min  Charges:  $Gait Training: 8-22 mins $Therapeutic Activity: 8-22 mins                     Rica Koyanagi  PTA Acute  Rehabilitation Services Pager      [phone] Office      [phone]

## 2019-05-04 NOTE — Discharge Summary (Signed)
Physician Discharge Summary   Patient ID: Ashley Ruiz MRN: [id number] DOB/AGE: 1929/07/22 85 y.o.  Admit date: 05/02/2019 Discharge date: 05/03/2019  Primary Diagnosis: Osteoarthritis, left knee   Admission Diagnoses:  Past Medical History:  Diagnosis Date   Aortic stenosis    peak/mean pressure gradient 28/16 mmHg by echo at [facility] 12/28/15   Arthritis    OA AND PAIN BOTH KNEES; SOME PAIN IN RT HIP   Cancer [facility])    SKIN CANCER ON LT EAR   Essential hypertension 02/01/2016   Heart murmur    Hip arthritis 02/01/2016   History of anemia    History of gallstones    Hypercholesteremia    Hyperlipidemia 02/01/2016   Hypertension    PONV (postoperative nausea and vomiting)    Stroke ([place])    ?   SMALL TIA , PATIENT HAD 1 EPISODE DOUBLE VISION 12/2014   TIA (transient ischemic attack) 02/09/2017   Discharge Diagnoses:   Active Problems:   OA (osteoarthritis) of knee  Estimated body mass index is 30.18 kg/m as calculated from the following:   Height as of this encounter: 5\' 2"  (1.575 m).   Weight as of this encounter: 74.8 kg.  Procedure:  Procedure(s) (LRB): TOTAL KNEE ARTHROPLASTY (Left)   Consults: None  HPI: Ashley Ruiz is a 85 y.o. year old female with end stage OA of her left knee with progressively worsening pain and dysfunction. She has constant pain, with activity and at rest and significant functional deficits with difficulties even with ADLs. She has had extensive non-op management including analgesics, injections of cortisone and viscosupplements, and home exercise program, but remains in significant pain with significant dysfunction. Radiographs show bone on bone arthritis medial and patellofemoral. She presents now for left Total Knee Arthroplasty.    Laboratory Data: Admission on 05/02/2019, Discharged on 05/03/2019  Component Date Value Ref Range Status   WBC 05/03/2019 12.9* 4.0 - 10.5 K/uL Final   RBC 05/03/2019 4.38  3.87 - 5.11  MIL/uL Final   Hemoglobin 05/03/2019 12.3  12.0 - 15.0 g/dL Final   HCT 05/03/2019 37.5  36.0 - 46.0 % Final   MCV 05/03/2019 85.6  80.0 - 100.0 fL Final   MCH 05/03/2019 28.1  26.0 - 34.0 pg Final   MCHC 05/03/2019 32.8  30.0 - 36.0 g/dL Final   RDW 05/03/2019 13.9  11.5 - 15.5 % Final   Platelets 05/03/2019 151  150 - 400 K/uL Final   nRBC 05/03/2019 0.0  0.0 - 0.2 % Final   Performed at [facility], [place] [address] [place], [place] [address]   Sodium 05/03/2019 136  135 - 145 mmol/L Final   Potassium 05/03/2019 3.8  3.5 - 5.1 mmol/L Final   Chloride 05/03/2019 107  98 - 111 mmol/L Final   CO2 05/03/2019 22  22 - 32 mmol/L Final   Glucose, Bld 05/03/2019 113* 70 - 99 mg/dL Final   BUN 05/03/2019 16  8 - 23 mg/dL Final   Creatinine, Ser 05/03/2019 0.65  0.44 - 1.00 mg/dL Final   Calcium 05/03/2019 8.5* 8.9 - 10.3 mg/dL Final   GFR calc non Af Amer 05/03/2019 >60  >60 mL/min Final   GFR calc Af Amer 05/03/2019 >60  >60 mL/min Final   Anion gap 05/03/2019 7  5 - 15 Final   Performed at [facility], [place] [address], [place], [place] [address]  Hospital Outpatient Visit on 04/28/2019  Component Date Value Ref Range  Status   SARS-CoV-2, NAA 04/28/2019 NOT DETECTED  NOT DETECTED Final   Comment: (NOTE) This test was developed and its performance characteristics determined by [employer]. This test has not been FDA cleared or approved. This test has been authorized by FDA under an Emergency Use Authorization (EUA). This test is only authorized for the duration of time the declaration that circumstances exist justifying the authorization of the emergency use of in vitro diagnostic tests for detection of SARS-CoV-2 virus and/or diagnosis of COVID-19 infection under section 564(b)(1) of the Act, 21 U.S.C. 209OBS-9(G)(2), unless the authorization is terminated or revoked sooner. When diagnostic testing is negative, the  possibility of a false negative result should be considered in the context of a patient's recent exposures and the presence of clinical signs and symptoms consistent with COVID-19. An individual without symptoms of COVID-19 and who is not shedding SARS-CoV-2 virus would expect to have a negative (not detected) result in this assay. Performed                           At: [facility] [place], [place] [id number] Rush Farmer MD LY:[id number]    Coronavirus Source 04/28/2019 NASOPHARYNGEAL   Final   Performed at [facility], [place] [address], [place], [place] [address]  Hospital Outpatient Visit on 04/28/2019  Component Date Value Ref Range Status   aPTT 04/28/2019 34  24 - 36 seconds Final   Performed at [facility], [place] [address] [place], [place] [address]   WBC 04/28/2019 8.1  4.0 - 10.5 K/uL Final   RBC 04/28/2019 5.27* 3.87 - 5.11 MIL/uL Final   Hemoglobin 04/28/2019 14.9  12.0 - 15.0 g/dL Final   HCT 04/28/2019 44.3  36.0 - 46.0 % Final   MCV 04/28/2019 84.1  80.0 - 100.0 fL Final   MCH 04/28/2019 28.3  26.0 - 34.0 pg Final   MCHC 04/28/2019 33.6  30.0 - 36.0 g/dL Final   RDW 04/28/2019 13.4  11.5 - 15.5 % Final   Platelets 04/28/2019 181  150 - 400 K/uL Final   nRBC 04/28/2019 0.0  0.0 - 0.2 % Final   Performed at [facility], [place] [address] [place], [place] [address]   Sodium 04/28/2019 137  135 - 145 mmol/L Final   Potassium 04/28/2019 3.8  3.5 - 5.1 mmol/L Final   Chloride 04/28/2019 105  98 - 111 mmol/L Final   CO2 04/28/2019 24  22 - 32 mmol/L Final   Glucose, Bld 04/28/2019 96  70 - 99 mg/dL Final   BUN 04/28/2019 19  8 - 23 mg/dL Final   Creatinine, Ser 04/28/2019 0.76  0.44 - 1.00 mg/dL Final   Calcium 04/28/2019 9.6  8.9 - 10.3 mg/dL Final   Total Protein 04/28/2019 7.7  6.5 - 8.1 g/dL Final   Albumin 04/28/2019 4.4  3.5 - 5.0 g/dL Final   AST 04/28/2019 23  15 - 41 U/L  Final   ALT 04/28/2019 17  0 - 44 U/L Final   Alkaline Phosphatase 04/28/2019 69  38 - 126 U/L Final   Total Bilirubin 04/28/2019 0.4  0.3 - 1.2 mg/dL Final   GFR calc non Af Amer 04/28/2019 >60  >60 mL/min Final   GFR calc Af Amer 04/28/2019 >60  >60 mL/min Final   Anion gap 04/28/2019 8  5 - 15 Final   Performed at [facility], [place]  [address] [place], [place] [address]   Prothrombin Time 04/28/2019 13.5  11.4 - 15.2 seconds Final   INR 04/28/2019 1.0  0.8 - 1.2 Final   Comment: (NOTE) INR goal varies based on device and disease states. Performed at [facility], [place] [address], [place], [place] [address]    ABO/RH(D) 04/28/2019 O NEG   Final   Antibody Screen 04/28/2019 NEG   Final   Sample Expiration 04/28/2019 05/05/2019,2359   Final   Extend sample reason 04/28/2019    Final                   Value:NO TRANSFUSIONS OR PREGNANCY IN THE PAST 3 MONTHS Performed at [place] [address] [place], [place] [address]    MRSA, PCR 04/28/2019 NEGATIVE  NEGATIVE Final   Staphylococcus aureus 04/28/2019 POSITIVE* NEGATIVE Final   Comment: (NOTE) The Xpert SA Assay (FDA approved for NASAL specimens in patients 61 years of age and older), is one component of a comprehensive surveillance program. It is not intended to diagnose infection nor to guide or monitor treatment. Performed at [facility], [place] [address], [place], [place] [address]      X-Rays:No results found.  EKG: Orders placed or performed during the hospital encounter of 04/28/19   EKG 12 lead   EKG 12 lead     Hospital Course: Ashley Ruiz is a 85 y.o. who was [facility] They were brought to the operating room on 05/02/2019 and underwent Procedure(s): TOTAL KNEE ARTHROPLASTY.  Patient tolerated the procedure well and was later transferred to the recovery room and then to the orthopaedic floor for  postoperative care. They were given PO and IV analgesics for pain control following their surgery. They were given 24 hours of postoperative antibiotics of  Anti-infectives (From admission, onward)   Start     Dose/Rate Route Frequency Ordered Stop   05/02/19 1400  ceFAZolin (ANCEF) IVPB 1 g/50 mL premix     1 g 100 mL/hr over 30 Minutes Intravenous Every 6 hours 05/02/19 0950 05/02/19 2115   05/02/19 0600  ceFAZolin (ANCEF) IVPB 2g/100 mL premix     2 g 200 mL/hr over 30 Minutes Intravenous On call to O.R. 05/02/19 0533 05/02/19 0715     and started on DVT prophylaxis in the form of Aspirin and Plavix.   PT and OT were ordered for total joint protocol. Discharge planning consulted to help with postop disposition and equipment needs.  Patient had a good night on the evening of surgery. They started to get up OOB with therapy on POD #0. Pt was seen during rounds and was ready to go home pending progress with therapy. Hemovac drain was pulled without difficulty. She worked with therapy on POD #1 and was meeting her goals. Pt was discharged to home later that day in stable condition.  Diet: Cardiac diet Activity: WBAT Follow-up: in 2 weeks Disposition: Home with outpatient PT at [facility] Discharged Condition: stable   Discharge Instructions    Call MD / Call 911   Complete by:  As directed    If you experience chest pain or shortness of breath, CALL 911 and be transported to the hospital emergency room.  If you develope a fever above 101 F, pus (white drainage) or increased drainage or redness at the wound, or calf pain, call your surgeon's office.   Change dressing   Complete by:  As directed    Change  dressing on Wednesday, then change the dressing daily with sterile 4 x 4 inch gauze dressing and apply TED hose.   Constipation Prevention   Complete by:  As directed    Drink plenty of fluids.  Prune juice may be helpful.  You may use a stool softener, such as Colace (over the counter) 100 mg  twice a day.  Use MiraLax (over the counter) for constipation as needed.   Diet - low sodium heart healthy   Complete by:  As directed    Discharge instructions   Complete by:  As directed    Dr. Gaynelle Arabian Total Joint Specialist [facility] [address] [place], [place] [address] [phone]  TOTAL KNEE REPLACEMENT POSTOPERATIVE DIRECTIONS  Knee Rehabilitation, Guidelines Following Surgery  Results after knee surgery are often greatly improved when you follow the exercise, range of motion and muscle strengthening exercises prescribed by your doctor. Safety measures are also important to protect the knee from further injury. Any time any of these exercises cause you to have increased pain or swelling in your knee joint, decrease the amount until you are comfortable again and slowly increase them. If you have problems or questions, call your caregiver or physical therapist for advice.   HOME CARE INSTRUCTIONS  Remove items at home which could result in a fall. This includes throw rugs or furniture in walking pathways.  ICE to the affected knee every three hours for 30 minutes at a time and then as needed for pain and swelling.  Continue to use ice on the knee for pain and swelling from surgery. You may notice swelling that will progress down to the foot and ankle.  This is normal after surgery.  Elevate the leg when you are not up walking on it.   Continue to use the breathing machine which will help keep your temperature down.  It is common for your temperature to cycle up and down following surgery, especially at night when you are not up moving around and exerting yourself.  The breathing machine keeps your lungs expanded and your temperature down. Do not place pillow under knee, focus on keeping the knee straight while resting   DIET You may resume your previous home diet once your are discharged from the hospital.  DRESSING / WOUND CARE / SHOWERING You may change  your dressing 3-5 days after surgery.  Then change the dressing every day with sterile gauze.  Please use good hand washing techniques before changing the dressing.  Do not use any lotions or creams on the incision until instructed by your surgeon. You may start showering once you are discharged home but do not submerge the incision under water. Just pat the incision dry and apply a dry gauze dressing on daily. Change the surgical dressing daily and reapply a dry dressing each time.  ACTIVITY Walk with your walker as instructed. Use walker as long as suggested by your caregivers. Avoid periods of inactivity such as sitting longer than an hour when not asleep. This helps prevent blood clots.  You may resume a sexual relationship in one month or when given the OK by your doctor.  You may return to work once you are cleared by your doctor.  Do not drive a car for 6 weeks or until released by you surgeon.  Do not drive while taking narcotics.  WEIGHT BEARING Weight bearing as tolerated with assist device (walker, cane, etc) as directed, use it as long as suggested by your  surgeon or therapist, typically at least 4-6 weeks.  POSTOPERATIVE CONSTIPATION PROTOCOL Constipation - defined medically as fewer than three stools per week and severe constipation as less than one stool per week.  One of the most common issues patients have following surgery is constipation.  Even if you have a regular bowel pattern at home, your normal regimen is likely to be disrupted due to multiple reasons following surgery.  Combination of anesthesia, postoperative narcotics, change in appetite and fluid intake all can affect your bowels.  In order to avoid complications following surgery, here are some recommendations in order to help you during your recovery period.  Colace (docusate) - Pick up an over-the-counter form of Colace or another stool softener and take twice a day as long as you are requiring postoperative pain  medications.  Take with a full glass of water daily.  If you experience loose stools or diarrhea, hold the colace until you stool forms back up.  If your symptoms do not get better within 1 week or if they get worse, check with your doctor.  Dulcolax (bisacodyl) - Pick up over-the-counter and take as directed by the product packaging as needed to assist with the movement of your bowels.  Take with a full glass of water.  Use this product as needed if not relieved by Colace only.   MiraLax (polyethylene glycol) - Pick up over-the-counter to have on hand.  MiraLax is a solution that will increase the amount of water in your bowels to assist with bowel movements.  Take as directed and can mix with a glass of water, juice, soda, coffee, or tea.  Take if you go more than two days without a movement. Do not use MiraLax more than once per day. Call your doctor if you are still constipated or irregular after using this medication for 7 days in a row.  If you continue to have problems with postoperative constipation, please contact the office for further assistance and recommendations.  If you experience "the worst abdominal pain ever" or develop nausea or vomiting, please contact the office immediatly for further recommendations for treatment.  ITCHING  If you experience itching with your medications, try taking only a single pain pill, or even half a pain pill at a time.  You can also use Benadryl over the counter for itching or also to help with sleep.   TED HOSE STOCKINGS Wear the elastic stockings on both legs for three weeks following surgery during the day but you may remove then at night for sleeping.  MEDICATIONS See your medication summary on the "After Visit Summary" that the nursing staff will review with you prior to discharge.  You may have some home medications which will be placed on hold until you complete the course of blood thinner medication.  It is important for you to complete the blood  thinner medication as prescribed by your surgeon.  Continue your approved medications as instructed at time of discharge.  PRECAUTIONS If you experience chest pain or shortness of breath - call 911 immediately for transfer to the hospital emergency department.  If you develop a fever greater that 101 F, purulent drainage from wound, increased redness or drainage from wound, foul odor from the wound/dressing, or calf pain - CONTACT YOUR SURGEON.  FOLLOW-UP APPOINTMENTS Make sure you keep all of your appointments after your operation with your surgeon and caregivers. You should call the office at the above phone number and make an appointment for approximately two weeks after the date of your surgery or on the date instructed by your surgeon outlined in the "After Visit Summary".   RANGE OF MOTION AND STRENGTHENING EXERCISES  Rehabilitation of the knee is important following a knee injury or an operation. After just a few days of immobilization, the muscles of the thigh which control the knee become weakened and shrink (atrophy). Knee exercises are designed to build up the tone and strength of the thigh muscles and to improve knee motion. Often times heat used for twenty to thirty minutes before working out will loosen up your tissues and help with improving the range of motion but do not use heat for the first two weeks following surgery. These exercises can be done on a training (exercise) mat, on the floor, on a table or on a bed. Use what ever works the best and is most comfortable for you Knee exercises include:  Leg Lifts - While your knee is still immobilized in a splint or cast, you can do straight leg raises. Lift the leg to 60 degrees, hold for 3 sec, and slowly lower the leg. Repeat 10-20 times 2-3 times daily. Perform this exercise against resistance later as your knee gets better.  Quad and Hamstring Sets - Tighten up the muscle on the front of  the thigh (Quad) and hold for 5-10 sec. Repeat this 10-20 times hourly. Hamstring sets are done by pushing the foot backward against an object and holding for 5-10 sec. Repeat as with quad sets.  Leg Slides: Lying on your back, slowly slide your foot toward your buttocks, bending your knee up off the floor (only go as far as is comfortable). Then slowly slide your foot back down until your leg is flat on the floor again. Angel Wings: Lying on your back spread your legs to the side as far apart as you can without causing discomfort.  A rehabilitation program following serious knee injuries can speed recovery and prevent re-injury in the future due to weakened muscles. Contact your doctor or a physical therapist for more information on knee rehabilitation.   IF YOU ARE TRANSFERRED TO A SKILLED REHAB FACILITY If the patient is transferred to a skilled rehab facility following release from the hospital, a list of the current medications will be sent to the facility for the patient to continue.  When discharged from the skilled rehab facility, please have the facility set up the patient's [place] prior to being released. Also, the skilled facility will be responsible for providing the patient with their medications at time of release from the facility to include their pain medication, the muscle relaxants, and their blood thinner medication. If the patient is still at the rehab facility at time of the two week follow up appointment, the skilled rehab facility will also need to assist the patient in arranging follow up appointment in our office and any transportation needs.  MAKE SURE YOU:  Understand these instructions.  Get help right away if you are not doing well or get worse.    Pick up stool softner and laxative for home use following surgery while on pain medications. Do not submerge incision under water. Please use good hand washing techniques while changing dressing each  day. May shower starting three days after surgery.  Please use a clean towel to pat the incision dry following showers. Continue to use ice for pain and swelling after surgery. Do not use any lotions or creams on the incision until instructed by your surgeon.   Do not put a pillow under the knee. Place it under the heel.   Complete by:  As directed    Driving restrictions   Complete by:  As directed    No driving for two weeks   TED hose   Complete by:  As directed    Use stockings (TED hose) for three weeks on both leg(s).  You may remove them at night for sleeping.   Weight bearing as tolerated   Complete by:  As directed      Allergies as of 05/03/2019      Reactions   Biaxin [clarithromycin] Nausea And Vomiting      Medication List    STOP taking these medications   ibuprofen 200 MG tablet Commonly known as:  ADVIL     TAKE these medications   amLODipine 5 MG tablet Commonly known as:  NORVASC Take 1 tablet (5 mg total) by mouth daily.   aspirin 325 MG EC tablet Take 1 tablet (325 mg total) by mouth daily with breakfast for 20 days. Then take one 81 mg aspirin once a day for three weeks. Then discontinue aspirin.   Biotin 10000 MCG Tabs Take 1,000 mcg by mouth daily.   clopidogrel 75 MG tablet Commonly known as:  PLAVIX Take 75 mg by mouth daily.   Co Q 10 100 MG Caps Take 100 mg by mouth daily.   HYDROcodone-acetaminophen 5-325 MG tablet Commonly known as:  NORCO/VICODIN Take 1-2 tablets by mouth every 6 (six) hours as needed for severe pain.   latanoprost 0.005 % ophthalmic solution Commonly known as:  XALATAN Place 1 drop into the right eye at bedtime.   losartan 100 MG tablet Commonly known as:  COZAAR Take 100 mg by mouth every morning.   methocarbamol 500 MG tablet Commonly known as:  ROBAXIN Take 1 tablet (500 mg total) by mouth every 6 (six) hours as needed for muscle spasms.   simvastatin 10 MG tablet Commonly known as:  ZOCOR Take 10 mg by  mouth at bedtime.   spironolactone-hydrochlorothiazide 25-25 MG tablet Commonly known as:  ALDACTAZIDE Take 1 tablet by mouth every other day.   traMADol 50 MG tablet Commonly known as:  ULTRAM Take 1-2 tablets (50-100 mg total) by mouth every 6 (six) hours as needed for moderate pain.   Vitamin D2 10 MCG (400 UNIT) Tabs Take 400 Units by mouth daily.            Discharge Care Instructions  (From admission, onward)         Start     Ordered   05/03/19 0000  Weight bearing as tolerated     05/03/19 0718   05/03/19 0000  Change dressing    Comments:  Change dressing on Wednesday, then change the dressing daily with sterile 4 x 4 inch gauze dressing and apply TED hose.   05/03/19 2297         Follow-up Information    Gaynelle Arabian, MD. Go on 05/17/2019.   Specialty:  Orthopedic Surgery Why:  You are scheduled for a post-operative appointment on 05-17-19 at 2:00 pm. Contact information: [address] [place] [address] [phone]        [facility], [place]. Go on 05/06/2019.   Why:  You are scheduled to start physical therapy at [facility] on 05-05-19. Contact information: [place] [address] [phone]           Signed: Theresa Duty, PA-C Orthopedic Surgery 05/04/2019, 9:18 AM

## 2019-05-05 DIAGNOSIS — M62552 Muscle wasting and atrophy, not elsewhere classified, left thigh: Secondary | ICD-10-CM | POA: Diagnosis not present

## 2019-05-05 DIAGNOSIS — M25562 Pain in left knee: Secondary | ICD-10-CM | POA: Diagnosis not present

## 2019-05-05 DIAGNOSIS — Z96652 Presence of left artificial knee joint: Secondary | ICD-10-CM | POA: Diagnosis not present

## 2019-05-05 DIAGNOSIS — R2689 Other abnormalities of gait and mobility: Secondary | ICD-10-CM | POA: Diagnosis not present

## 2019-05-09 DIAGNOSIS — Z96652 Presence of left artificial knee joint: Secondary | ICD-10-CM | POA: Diagnosis not present

## 2019-05-09 DIAGNOSIS — M62552 Muscle wasting and atrophy, not elsewhere classified, left thigh: Secondary | ICD-10-CM | POA: Diagnosis not present

## 2019-05-09 DIAGNOSIS — R2689 Other abnormalities of gait and mobility: Secondary | ICD-10-CM | POA: Diagnosis not present

## 2019-05-09 DIAGNOSIS — M25562 Pain in left knee: Secondary | ICD-10-CM | POA: Diagnosis not present

## 2019-05-12 DIAGNOSIS — M25562 Pain in left knee: Secondary | ICD-10-CM | POA: Diagnosis not present

## 2019-05-12 DIAGNOSIS — M62552 Muscle wasting and atrophy, not elsewhere classified, left thigh: Secondary | ICD-10-CM | POA: Diagnosis not present

## 2019-05-12 DIAGNOSIS — Z96652 Presence of left artificial knee joint: Secondary | ICD-10-CM | POA: Diagnosis not present

## 2019-05-12 DIAGNOSIS — R2689 Other abnormalities of gait and mobility: Secondary | ICD-10-CM | POA: Diagnosis not present

## 2019-05-16 DIAGNOSIS — Z96652 Presence of left artificial knee joint: Secondary | ICD-10-CM | POA: Diagnosis not present

## 2019-05-16 DIAGNOSIS — M62552 Muscle wasting and atrophy, not elsewhere classified, left thigh: Secondary | ICD-10-CM | POA: Diagnosis not present

## 2019-05-16 DIAGNOSIS — R2689 Other abnormalities of gait and mobility: Secondary | ICD-10-CM | POA: Diagnosis not present

## 2019-05-16 DIAGNOSIS — M25562 Pain in left knee: Secondary | ICD-10-CM | POA: Diagnosis not present

## 2019-05-16 DIAGNOSIS — Z96653 Presence of artificial knee joint, bilateral: Secondary | ICD-10-CM

## 2019-05-16 HISTORY — DX: Presence of artificial knee joint, bilateral: Z96.653

## 2019-05-19 DIAGNOSIS — M62552 Muscle wasting and atrophy, not elsewhere classified, left thigh: Secondary | ICD-10-CM | POA: Diagnosis not present

## 2019-05-19 DIAGNOSIS — R2689 Other abnormalities of gait and mobility: Secondary | ICD-10-CM | POA: Diagnosis not present

## 2019-05-19 DIAGNOSIS — Z96652 Presence of left artificial knee joint: Secondary | ICD-10-CM | POA: Diagnosis not present

## 2019-05-19 DIAGNOSIS — M25562 Pain in left knee: Secondary | ICD-10-CM | POA: Diagnosis not present

## 2019-05-23 DIAGNOSIS — R2689 Other abnormalities of gait and mobility: Secondary | ICD-10-CM | POA: Diagnosis not present

## 2019-05-23 DIAGNOSIS — M62552 Muscle wasting and atrophy, not elsewhere classified, left thigh: Secondary | ICD-10-CM | POA: Diagnosis not present

## 2019-05-23 DIAGNOSIS — Z96652 Presence of left artificial knee joint: Secondary | ICD-10-CM | POA: Diagnosis not present

## 2019-05-23 DIAGNOSIS — M25562 Pain in left knee: Secondary | ICD-10-CM | POA: Diagnosis not present

## 2019-05-26 DIAGNOSIS — Z96652 Presence of left artificial knee joint: Secondary | ICD-10-CM | POA: Diagnosis not present

## 2019-05-26 DIAGNOSIS — R2689 Other abnormalities of gait and mobility: Secondary | ICD-10-CM | POA: Diagnosis not present

## 2019-05-26 DIAGNOSIS — M62552 Muscle wasting and atrophy, not elsewhere classified, left thigh: Secondary | ICD-10-CM | POA: Diagnosis not present

## 2019-05-26 DIAGNOSIS — M25562 Pain in left knee: Secondary | ICD-10-CM | POA: Diagnosis not present

## 2019-05-30 DIAGNOSIS — M25562 Pain in left knee: Secondary | ICD-10-CM | POA: Diagnosis not present

## 2019-05-30 DIAGNOSIS — Z96652 Presence of left artificial knee joint: Secondary | ICD-10-CM | POA: Diagnosis not present

## 2019-05-30 DIAGNOSIS — M62552 Muscle wasting and atrophy, not elsewhere classified, left thigh: Secondary | ICD-10-CM | POA: Diagnosis not present

## 2019-05-30 DIAGNOSIS — R2689 Other abnormalities of gait and mobility: Secondary | ICD-10-CM | POA: Diagnosis not present

## 2019-06-02 DIAGNOSIS — M25562 Pain in left knee: Secondary | ICD-10-CM | POA: Diagnosis not present

## 2019-06-02 DIAGNOSIS — R2689 Other abnormalities of gait and mobility: Secondary | ICD-10-CM | POA: Diagnosis not present

## 2019-06-02 DIAGNOSIS — Z96652 Presence of left artificial knee joint: Secondary | ICD-10-CM | POA: Diagnosis not present

## 2019-06-02 DIAGNOSIS — M62552 Muscle wasting and atrophy, not elsewhere classified, left thigh: Secondary | ICD-10-CM | POA: Diagnosis not present

## 2019-06-06 DIAGNOSIS — R2689 Other abnormalities of gait and mobility: Secondary | ICD-10-CM | POA: Diagnosis not present

## 2019-06-06 DIAGNOSIS — M25562 Pain in left knee: Secondary | ICD-10-CM | POA: Diagnosis not present

## 2019-06-06 DIAGNOSIS — Z96652 Presence of left artificial knee joint: Secondary | ICD-10-CM | POA: Diagnosis not present

## 2019-06-06 DIAGNOSIS — M62552 Muscle wasting and atrophy, not elsewhere classified, left thigh: Secondary | ICD-10-CM | POA: Diagnosis not present

## 2019-06-07 DIAGNOSIS — Z471 Aftercare following joint replacement surgery: Secondary | ICD-10-CM | POA: Diagnosis not present

## 2019-06-07 DIAGNOSIS — Z96652 Presence of left artificial knee joint: Secondary | ICD-10-CM | POA: Diagnosis not present

## 2019-06-09 DIAGNOSIS — M25562 Pain in left knee: Secondary | ICD-10-CM | POA: Diagnosis not present

## 2019-06-09 DIAGNOSIS — R2689 Other abnormalities of gait and mobility: Secondary | ICD-10-CM | POA: Diagnosis not present

## 2019-06-09 DIAGNOSIS — M62552 Muscle wasting and atrophy, not elsewhere classified, left thigh: Secondary | ICD-10-CM | POA: Diagnosis not present

## 2019-06-09 DIAGNOSIS — Z96652 Presence of left artificial knee joint: Secondary | ICD-10-CM | POA: Diagnosis not present

## 2019-06-10 DIAGNOSIS — I080 Rheumatic disorders of both mitral and aortic valves: Secondary | ICD-10-CM | POA: Diagnosis not present

## 2019-06-10 DIAGNOSIS — I679 Cerebrovascular disease, unspecified: Secondary | ICD-10-CM | POA: Diagnosis not present

## 2019-06-10 DIAGNOSIS — Z0000 Encounter for general adult medical examination without abnormal findings: Secondary | ICD-10-CM | POA: Diagnosis not present

## 2019-06-10 DIAGNOSIS — I10 Essential (primary) hypertension: Secondary | ICD-10-CM | POA: Diagnosis not present

## 2019-06-10 DIAGNOSIS — E785 Hyperlipidemia, unspecified: Secondary | ICD-10-CM | POA: Diagnosis not present

## 2019-06-14 DIAGNOSIS — E559 Vitamin D deficiency, unspecified: Secondary | ICD-10-CM | POA: Diagnosis not present

## 2019-06-14 DIAGNOSIS — Z79899 Other long term (current) drug therapy: Secondary | ICD-10-CM | POA: Diagnosis not present

## 2019-06-14 DIAGNOSIS — R739 Hyperglycemia, unspecified: Secondary | ICD-10-CM | POA: Diagnosis not present

## 2019-06-14 DIAGNOSIS — E785 Hyperlipidemia, unspecified: Secondary | ICD-10-CM | POA: Diagnosis not present

## 2019-06-24 DIAGNOSIS — I679 Cerebrovascular disease, unspecified: Secondary | ICD-10-CM | POA: Diagnosis not present

## 2019-06-24 DIAGNOSIS — E782 Mixed hyperlipidemia: Secondary | ICD-10-CM | POA: Diagnosis not present

## 2019-06-24 DIAGNOSIS — G479 Sleep disorder, unspecified: Secondary | ICD-10-CM | POA: Diagnosis not present

## 2019-07-04 ENCOUNTER — Telehealth: Payer: Self-pay | Admitting: Cardiology

## 2019-07-04 NOTE — Telephone Encounter (Signed)
Has questions about coming off Plavix due to nosebleeds

## 2019-07-05 NOTE — Telephone Encounter (Signed)
Patient states she has had 4 nosebleeds within the last week but has not had any yesterday or today. Patient confirms that she is taking plavix 75 mg daily. She has an appointment scheduled with ENT on 08/03/2019 which was the first available appointment. Please advise of any further recommendations. Thanks!

## 2019-07-05 NOTE — Telephone Encounter (Signed)
Recommend she follow up with ENT. Would not advise stopping Plavix due to her history of TIA unless recommended by ENT.

## 2019-07-05 NOTE — Telephone Encounter (Signed)
Patient informed to continue taking plavix 75 mg daily unless otherwise directed by ENT. Patient is agreeable and verbalized understanding. No further questions.

## 2019-07-22 DIAGNOSIS — H26492 Other secondary cataract, left eye: Secondary | ICD-10-CM | POA: Diagnosis not present

## 2019-07-22 DIAGNOSIS — H40003 Preglaucoma, unspecified, bilateral: Secondary | ICD-10-CM | POA: Diagnosis not present

## 2019-07-25 DIAGNOSIS — E785 Hyperlipidemia, unspecified: Secondary | ICD-10-CM | POA: Diagnosis not present

## 2019-07-25 DIAGNOSIS — I10 Essential (primary) hypertension: Secondary | ICD-10-CM | POA: Diagnosis not present

## 2019-07-27 ENCOUNTER — Ambulatory Visit: Payer: Medicare Other | Admitting: Cardiology

## 2019-08-20 DIAGNOSIS — L820 Inflamed seborrheic keratosis: Secondary | ICD-10-CM | POA: Diagnosis not present

## 2019-08-31 DIAGNOSIS — Z23 Encounter for immunization: Secondary | ICD-10-CM | POA: Diagnosis not present

## 2019-09-24 DIAGNOSIS — I10 Essential (primary) hypertension: Secondary | ICD-10-CM | POA: Diagnosis not present

## 2019-09-24 DIAGNOSIS — E785 Hyperlipidemia, unspecified: Secondary | ICD-10-CM | POA: Diagnosis not present

## 2019-09-24 DIAGNOSIS — M810 Age-related osteoporosis without current pathological fracture: Secondary | ICD-10-CM | POA: Diagnosis not present

## 2019-09-30 ENCOUNTER — Ambulatory Visit (INDEPENDENT_AMBULATORY_CARE_PROVIDER_SITE_OTHER): Payer: Medicare Other | Admitting: Cardiology

## 2019-09-30 ENCOUNTER — Encounter: Payer: Self-pay | Admitting: Cardiology

## 2019-09-30 ENCOUNTER — Other Ambulatory Visit: Payer: Self-pay

## 2019-09-30 VITALS — BP 136/70 | HR 90 | Ht 62.0 in | Wt 166.8 lb

## 2019-09-30 DIAGNOSIS — E782 Mixed hyperlipidemia: Secondary | ICD-10-CM

## 2019-09-30 DIAGNOSIS — I350 Nonrheumatic aortic (valve) stenosis: Secondary | ICD-10-CM | POA: Diagnosis not present

## 2019-09-30 DIAGNOSIS — I119 Hypertensive heart disease without heart failure: Secondary | ICD-10-CM

## 2019-09-30 NOTE — Patient Instructions (Signed)
Medication Instructions:  Your physician recommends that you continue on your current medications as directed. Please refer to the Current Medication list given to you today.  *If you need a refill on your cardiac medications before your next appointment, please call your pharmacy*  Lab Work: Your physician recommends that you return for lab work today: CMP, lipid panel.   If you have labs (blood work) drawn today and your tests are completely normal, you will receive your results only by: Marland Kitchen MyChart Message (if you have MyChart) OR . A paper copy in the mail If you have any lab test that is abnormal or we need to change your treatment, we will call you to review the results.  Testing/Procedures: Your physician has requested that you have an echocardiogram. Echocardiography is a painless test that uses sound waves to create images of your heart. It provides your doctor with information about the size and shape of your heart and how well your heart's chambers and valves are working. This procedure takes approximately one hour. There are no restrictions for this procedure. This will be scheduled in the [place] office in June 2021.   Follow-Up: At [facility], you and your health needs are our priority.  As part of our continuing mission to provide you with exceptional heart care, we have created designated Provider Care Teams.  These Care Teams include your primary Cardiologist (physician) and Advanced Practice Providers (APPs -  Physician Assistants and Nurse Practitioners) who all work together to provide you with the care you need, when you need it.  Your next appointment:   8 months  The format for your next appointment:   In Person  Provider:   Shirlee More, MD

## 2019-09-30 NOTE — Progress Notes (Signed)
Cardiology Office Note:    Date:  85/04/3148   ID:  Ashley Ruiz, DOB [birth year], MRN [id number]  PCP:  Street, Sharon Mt, MD  Cardiologist:  Shirlee More, MD    Referring MD: Street, Sharon Mt, *    ASSESSMENT:    1. Moderate aortic stenosis   2. Hypertensive heart disease without heart failure   3. Mixed hyperlipidemia    PLAN:    In order of problems listed above:  1. Stable asymptomatic plan echocardiogram in June follow-up afterwards and then follow-up in July.  I do not see her needing valvular intervention in the short intermediate term. 2. Stable hypertension continue current treatment including ARB diuretic check renal function potassium 3. Stable lipids at target check liver function lipid profile.   Next appointment: July 2021   Medication Adjustments/Labs and Tests Ordered: Current medicines are reviewed at length with the patient today.  Concerns regarding medicines are outlined above.  Orders Placed This Encounter  Procedures  . Comp Met (CMET)  . Lipid Profile  . ECHOCARDIOGRAM COMPLETE   No orders of the defined types were placed in this encounter.   Chief Complaint  Patient presents with  . Follow-up  . Aortic Stenosis    History of Present Illness:    Ashley Ruiz is a 85 y.o. female with a hx of moderate aortic stenosis peak and mean gradients 39/21 mmHg and VTI ratio 0.48.  Last seen 03/18/2019 prior to elective total knee arthroplasty.. Compliance with diet, lifestyle and medications: Yes  Treatment a great recovery from total knee arthroplasty is having no joint pain quite active.  2 of her children had COVID-19 but she has not and she practices good protective mechanisms.  He has had no edema shortness of breath chest pain palpitation or syncope.  Compliant with medications.  To avoid further medical interactions check labs including lipids and CMP today.  We will plan echocardiogram for June and follow-up in the office  afterwards for her aortic stenosis that is progressed from mild to moderate.  She is asymptomatic. Past Medical History:  Diagnosis Date  . Aortic stenosis    peak/mean pressure gradient 28/16 mmHg by echo at [facility] 12/28/15  . Arthritis    OA AND PAIN BOTH KNEES; SOME PAIN IN RT HIP  . Cancer (HCC)    SKIN CANCER ON LT EAR  . Essential hypertension 02/01/2016  . Heart murmur   . Hip arthritis 02/01/2016  . History of anemia   . History of gallstones   . Hypercholesteremia   . Hyperlipidemia 02/01/2016  . Hypertension   . PONV (postoperative nausea and vomiting)   . Stroke [facility])    ?   SMALL TIA , PATIENT HAD 1 EPISODE DOUBLE VISION 12/2014  . TIA (transient ischemic attack) 02/09/2017    Past Surgical History:  Procedure Laterality Date  . ABDOMINAL HYSTERECTOMY  ? 2005  . CHOLECYSTECTOMY  ? 1982  . COLONOSCOPY    . TONSILLECTOMY  1943  . TOTAL HIP ARTHROPLASTY Right 03/12/2016   Procedure: RIGHT TOTAL HIP ARTHROPLASTY ANTERIOR APPROACH WITH LEFT KNEE INJECTION;  Surgeon: Gaynelle Arabian, MD;  Location: [facility];  Service: Orthopedics;  Laterality: Right;  . TOTAL KNEE ARTHROPLASTY Right 01/23/2014   Procedure: RIGHT TOTAL KNEE ARTHROPLASTY;  Surgeon: [name], MD;  Location: [facility];  Service: Orthopedics;  Laterality: Right;  . TOTAL KNEE ARTHROPLASTY Left 05/02/2019   Procedure: TOTAL KNEE ARTHROPLASTY;  Surgeon: Gaynelle Arabian, MD;  Location: [facility];  Service: Orthopedics;  Laterality: Left;  88mn    Current Medications: Current Meds  Medication Sig  . amLODipine (NORVASC) 5 MG tablet Take 1 tablet (5 mg total) by mouth daily.  . Biotin 10000 MCG TABS Take 1,000 mcg by mouth daily.   . clopidogrel (PLAVIX) 75 MG tablet Take 75 mg by mouth daily.  . Coenzyme Q10 (CO Q 10) 100 MG CAPS Take 100 mg by mouth daily.   .Marland Kitchenlatanoprost (XALATAN) 0.005 % ophthalmic solution Place 1 drop into the right eye at bedtime.   .Marland Kitchenlosartan (COZAAR) 100 MG tablet Take 100 mg by mouth  every morning.   . simvastatin (ZOCOR) 10 MG tablet Take 10 mg by mouth at bedtime.   .Marland Kitchenspironolactone-hydrochlorothiazide (ALDACTAZIDE) 25-25 MG tablet Take 1 tablet by mouth every other day.     Allergies:   Biaxin [clarithromycin]   Social History   Socioeconomic History  . Marital status: Widowed    Spouse name: Not on file  . Number of children: Not on file  . Years of education: Not on file  . Highest education level: Not on file  Occupational History  . Not on file  Social Needs  . Financial resource strain: Not on file  . Food insecurity    Worry: Not on file    Inability: Not on file  . Transportation needs    Medical: Not on file    Non-medical: Not on file  Tobacco Use  . Smoking status: Former Smoker    Packs/day: 0.25    Years: 10.00    Pack years: 2.50    Types: Cigarettes    Quit date: 04/12/1985    Years since quitting: 34.4  . Smokeless tobacco: Never Used  Substance and Sexual Activity  . Alcohol use: Not Currently    Comment: RARE GLASS WINE  . Drug use: No  . Sexual activity: Not on file  Lifestyle  . Physical activity    Days per week: Not on file    Minutes per session: Not on file  . Stress: Not on file  Relationships  . Social [place] phone: Not on file    Gets together: Not on file    Attends religious service: Not on file    Active member of club or organization: Not on file    Attends meetings of clubs or organizations: Not on file    Relationship status: Not on file  Other Topics Concern  . Not on file  Social History Narrative  . Not on file     Family History: The patient's family history includes Cancer in her brother; Heart attack in her father; Other in her mother. ROS:   Please see the history of present illness.    All other systems reviewed and are negative.  EKGs/Labs/Other Studies Reviewed:    The following studies were reviewed today:  EKG: 05/14/2019 sinus rhythm consider old anterior septal MI  occasional PVC otherwise normal  Recent Labs: 04/28/2019: ALT 17 05/03/2019: BUN 16; Creatinine, Ser 0.65; Hemoglobin 12.3; Platelets 151; Potassium 3.8; Sodium 136  Recent Lipid Panel    Component Value Date/Time   CHOL 149 10/28/2018 1516   TRIG 114 10/28/2018 1516   HDL 53 10/28/2018 1516   CHOLHDL 2.8 10/28/2018 1516   LDLCALC 73 10/28/2018 1516    Physical Exam:    VS:  BP (!) 152/62 (BP Location: Right Arm, Patient Position: Sitting, Cuff Size: Normal)   Pulse  90   Ht _0  (1.575 m)   Wt 166 lb 12.8 oz (75.7 kg)   SpO2 97%   BMI 30.51 kg/m     Wt Readings from Last 3 Encounters:  09/30/19 166 lb 12.8 oz (75.7 kg)  05/02/19 165 lb (74.8 kg)  04/28/19 165 lb (74.8 kg)     GEN:  Well nourished, well developed in no acute distress HEENT: Normal NECK: No JVD; No carotid bruits LYMPHATICS: No lymphadenopathy CARDIAC: 2/6 midsystolic localized aortic area does not encompass S2 ejection murmur no AR RRR, no rubs, gallops RESPIRATORY:  Clear to auscultation without rales, wheezing or rhonchi  ABDOMEN: Soft, non-tender, non-distended MUSCULOSKELETAL:  No edema; No deformity  SKIN: Warm and dry NEUROLOGIC:  Alert and oriented x 3 PSYCHIATRIC:  Normal affect    Signed, Shirlee More, MD  09/30/2019 2:44 PM    [place]

## 2019-10-01 LAB — COMPREHENSIVE METABOLIC PANEL
ALT: 11 IU/L (ref 0–32)
AST: 19 IU/L (ref 0–40)
Albumin/Globulin Ratio: 1.7 (ref 1.2–2.2)
Albumin: 4.3 g/dL (ref 3.6–4.6)
Alkaline Phosphatase: 79 IU/L (ref 39–117)
BUN/Creatinine Ratio: 22 (ref 12–28)
BUN: 17 mg/dL (ref 8–27)
Bilirubin Total: 0.2 mg/dL (ref 0.0–1.2)
CO2: 23 mmol/L (ref 20–29)
Calcium: 10.1 mg/dL (ref 8.7–10.3)
Chloride: 101 mmol/L (ref 96–106)
Creatinine, Ser: 0.79 mg/dL (ref 0.57–1.00)
GFR calc Af Amer: 77 mL/min/{1.73_m2} (ref >59)
GFR calc non Af Amer: 67 mL/min/{1.73_m2} (ref >59)
Globulin, Total: 2.6 g/dL (ref 1.5–4.5)
Glucose: 113 mg/dL — ABNORMAL HIGH (ref 65–99)
Potassium: 3.9 mmol/L (ref 3.5–5.2)
Sodium: 139 mmol/L (ref 134–144)
Total Protein: 6.9 g/dL (ref 6.0–8.5)

## 2019-10-01 LAB — LIPID PANEL
Chol/HDL Ratio: 2.6 ratio (ref 0.0–4.4)
Cholesterol, Total: 152 mg/dL (ref 100–199)
HDL: 58 mg/dL (ref >39)
LDL Chol Calc (NIH): 72 mg/dL (ref 0–99)
Triglycerides: 124 mg/dL (ref 0–149)
VLDL Cholesterol Cal: 22 mg/dL (ref 5–40)

## 2020-05-09 ENCOUNTER — Other Ambulatory Visit: Payer: Self-pay

## 2020-05-09 ENCOUNTER — Ambulatory Visit (INDEPENDENT_AMBULATORY_CARE_PROVIDER_SITE_OTHER): Payer: Medicare Other

## 2020-05-09 DIAGNOSIS — I350 Nonrheumatic aortic (valve) stenosis: Secondary | ICD-10-CM

## 2020-05-09 DIAGNOSIS — I119 Hypertensive heart disease without heart failure: Secondary | ICD-10-CM | POA: Diagnosis not present

## 2020-05-09 NOTE — Progress Notes (Signed)
Complete echocardiogram has been performed.  Jimmy Jazilyn Siegenthaler RDCS, RVT 

## 2020-07-11 ENCOUNTER — Other Ambulatory Visit: Payer: Self-pay

## 2020-07-11 DIAGNOSIS — I350 Nonrheumatic aortic (valve) stenosis: Secondary | ICD-10-CM | POA: Insufficient documentation

## 2020-07-11 DIAGNOSIS — Z8719 Personal history of other diseases of the digestive system: Secondary | ICD-10-CM | POA: Insufficient documentation

## 2020-07-11 DIAGNOSIS — R011 Cardiac murmur, unspecified: Secondary | ICD-10-CM | POA: Insufficient documentation

## 2020-07-11 DIAGNOSIS — I10 Essential (primary) hypertension: Secondary | ICD-10-CM | POA: Insufficient documentation

## 2020-07-11 DIAGNOSIS — R112 Nausea with vomiting, unspecified: Secondary | ICD-10-CM | POA: Insufficient documentation

## 2020-07-11 DIAGNOSIS — I639 Cerebral infarction, unspecified: Secondary | ICD-10-CM | POA: Insufficient documentation

## 2020-07-11 DIAGNOSIS — E7800 Pure hypercholesterolemia, unspecified: Secondary | ICD-10-CM | POA: Insufficient documentation

## 2020-07-11 DIAGNOSIS — M1990 Unspecified osteoarthritis, unspecified site: Secondary | ICD-10-CM | POA: Insufficient documentation

## 2020-07-11 DIAGNOSIS — C801 Malignant (primary) neoplasm, unspecified: Secondary | ICD-10-CM | POA: Insufficient documentation

## 2020-07-11 DIAGNOSIS — Z862 Personal history of diseases of the blood and blood-forming organs and certain disorders involving the immune mechanism: Secondary | ICD-10-CM | POA: Insufficient documentation

## 2020-07-13 ENCOUNTER — Encounter: Payer: Self-pay | Admitting: Cardiology

## 2020-07-13 ENCOUNTER — Ambulatory Visit (INDEPENDENT_AMBULATORY_CARE_PROVIDER_SITE_OTHER): Payer: Medicare Other | Admitting: Cardiology

## 2020-07-13 ENCOUNTER — Other Ambulatory Visit: Payer: Self-pay

## 2020-07-13 VITALS — BP 118/85 | HR 76 | Ht 62.0 in | Wt 169.0 lb

## 2020-07-13 DIAGNOSIS — I350 Nonrheumatic aortic (valve) stenosis: Secondary | ICD-10-CM

## 2020-07-13 DIAGNOSIS — I119 Hypertensive heart disease without heart failure: Secondary | ICD-10-CM | POA: Diagnosis not present

## 2020-07-13 DIAGNOSIS — E782 Mixed hyperlipidemia: Secondary | ICD-10-CM

## 2020-07-13 NOTE — Patient Instructions (Signed)
Medication Instructions:  Your physician recommends that you continue on your current medications as directed. Please refer to the Current Medication list given to you today.  *If you need a refill on your cardiac medications before your next appointment, please call your pharmacy*   Lab Work: None If you have labs (blood work) drawn today and your tests are completely normal, you will receive your results only by: Marland Kitchen MyChart Message (if you have MyChart) OR . A paper copy in the mail If you have any lab test that is abnormal or we need to change your treatment, we will call you to review the results.   Testing/Procedures: Your physician has requested that you have an echocardiogram in May. Echocardiography is a painless test that uses sound waves to create images of your heart. It provides your doctor with information about the size and shape of your heart and how well your heart's chambers and valves are working. This procedure takes approximately one hour. There are no restrictions for this procedure.     Follow-Up: At [facility], you and your health needs are our priority.  As part of our continuing mission to provide you with exceptional heart care, we have created designated Provider Care Teams.  These Care Teams include your primary Cardiologist (physician) and Advanced Practice Providers (APPs -  Physician Assistants and Nurse Practitioners) who all work together to provide you with the care you need, when you need it.  We recommend signing up for the patient portal called "MyChart".  Sign up information is provided on this After Visit Summary.  MyChart is used to connect with patients for Virtual Visits (Telemedicine).  Patients are able to view lab/test results, encounter notes, upcoming appointments, etc.  Non-urgent messages can be sent to your provider as well.   To learn more about what you can do with MyChart, go to [email or web address].    Your next appointment:   9  month(s)  The format for your next appointment:   In Person  Provider:   Shirlee More, MD   Other Instructions

## 2020-07-13 NOTE — Addendum Note (Signed)
Addended by: Resa Miner I on: 07/13/2020 01:45 PM   Modules accepted: Orders

## 2020-07-13 NOTE — Progress Notes (Signed)
Cardiology Office Note:    Date:  9/48/5462   ID:  Ashley Ruiz, DOB [birth year], MRN [id number]  PCP:  Street, Sharon Mt, MD  Cardiologist:  Shirlee More, MD    Referring MD: Street, Sharon Mt, *    ASSESSMENT:    1. Moderate aortic stenosis   2. Hypertensive heart disease without heart failure   3. Mixed hyperlipidemia    PLAN:    In order of problems listed above:  1. Fortunately her aortic stenosis is not progressed I think we can safely wait 9 months for repeat echocardiogram in the absence of symptoms. 2. Stable blood pressures at target continue current treatment including calcium channel blocker ARB and mixed thiazide diuretic spironolactone follow-up labs next week with her PCP renal function potassium 3. Stable she is on a statin indicated with aortic stenosis may well slow the progression continue the same and follow-up labs next week CMP for liver function lipids with her PCP   Next appointment: 9 months we will see her 1 week after echocardiogram   Medication Adjustments/Labs and Tests Ordered: Current medicines are reviewed at length with the patient today.  Concerns regarding medicines are outlined above.  No orders of the defined types were placed in this encounter.  No orders of the defined types were placed in this encounter.   Chief Complaint  Patient presents with   Follow-up   Aortic Stenosis    History of Present Illness:    Ashley Ruiz is a 86 y.o. female with a hx of moderate aortic stenosis hypertension and hyperlipidemia last seen 09/30/2019. Compliance with diet, lifestyle and medications: Yes  She had a follow-up echocardiogram 05/09/2020 for aortic stenosis.  Her peak and mean gradients were stable 44 and 24 mmHg VTI ratio 0.40 and aortic valve area in the range of 1.0 to 1.1 cm.  She had normal flow stroke-volume index 4 2 cc/m.  She had a very good result from the surgery vigorous active woman enjoys life has no  shortness of breath chest pain palpitation or syncope.  Is having a wellness exam next week with her PCP will have her lipids rechecked.  Is pleased to tell her aortic stenosis has not progressed we will plan to do an echocardiogram in 9 months and I will see her before that unless she is symptomatic.  Most recent labs 09/30/2019 cholesterol 152 LDL 72 HDL 58 creatinine was normal TSH normal.  EKG in my office today shows sinus rhythm 1 PVC poor R wave progression otherwise normal Past Medical History:  Diagnosis Date   Aortic stenosis    peak/mean pressure gradient 28/16 mmHg by echo at [facility] 12/28/15   Arthritis    OA AND PAIN BOTH KNEES; SOME PAIN IN RT HIP   Cancer [facility])    SKIN CANCER ON LT EAR   Essential hypertension 02/01/2016   Heart murmur    Hip arthritis 02/01/2016   History of anemia    History of gallstones    History of total knee replacement, bilateral 05/16/2019   Hypercholesteremia    Hyperlipidemia 02/01/2016   Hypertension    PONV (postoperative nausea and vomiting)    Stroke ([place])    ?   SMALL TIA , PATIENT HAD 1 EPISODE DOUBLE VISION 12/2014   TIA (transient ischemic attack) 02/09/2017    Past Surgical History:  Procedure Laterality Date   ABDOMINAL HYSTERECTOMY  ? 2005   CHOLECYSTECTOMY  ? 1982   COLONOSCOPY  TONSILLECTOMY  1943   TOTAL HIP ARTHROPLASTY Right 03/12/2016   Procedure: RIGHT TOTAL HIP ARTHROPLASTY ANTERIOR APPROACH WITH LEFT KNEE INJECTION;  Surgeon: Gaynelle Arabian, MD;  [facility]: [facility];  Service: Orthopedics;  Laterality: Right;   TOTAL KNEE ARTHROPLASTY Right 01/23/2014   Procedure: RIGHT TOTAL KNEE ARTHROPLASTY;  Surgeon: [name], MD;  Location: [facility];  Service: Orthopedics;  Laterality: Right;   TOTAL KNEE ARTHROPLASTY Left 05/02/2019   Procedure: TOTAL KNEE ARTHROPLASTY;  Surgeon: Gaynelle Arabian, MD;  Location: [facility];  Service: Orthopedics;  Laterality: Left;  81min    Current Medications: Current Meds    Medication Sig   amLODipine (NORVASC) 5 MG tablet Take 1 tablet (5 mg total) by mouth daily.   Biotin 10000 MCG TABS Take 1,000 mcg by mouth daily.    clopidogrel (PLAVIX) 75 MG tablet Take 75 mg by mouth daily.   Coenzyme Q10 (CO Q 10) 100 MG CAPS Take 100 mg by mouth daily.    Ergocalciferol 10 MCG (400 UNIT) TABS ergocalciferol (vitamin D2) 10 mcg (400 unit) tablet   400 units by oral route.   latanoprost (XALATAN) 0.005 % ophthalmic solution Place 1 drop into the right eye at bedtime.    losartan (COZAAR) 100 MG tablet Take 100 mg by mouth every morning.    methocarbamol (ROBAXIN) 500 MG tablet Take 500 mg by mouth 3 (three) times daily as needed.   simvastatin (ZOCOR) 10 MG tablet Take 10 mg by mouth at bedtime.    spironolactone-hydrochlorothiazide (ALDACTAZIDE) 25-25 MG tablet Take 1 tablet by mouth every other day.     Allergies:   Biaxin [clarithromycin] and Rifampin   Social History   Socioeconomic History   Marital status: Widowed    Spouse name: Not on file   Number of children: Not on file   Years of education: Not on file   Highest education level: Not on file  Occupational History   Not on file  Tobacco Use   Smoking status: Former Smoker    Packs/day: 0.25    Years: 10.00    Pack years: 2.50    Types: Cigarettes    Quit date: 04/12/1985    Years since quitting: 35.2   Smokeless tobacco: Never Used  Vaping Use   Vaping Use: Never used  Substance and Sexual Activity   Alcohol use: Not Currently    Comment: RARE GLASS WINE   Drug use: No   Sexual activity: Not on file  Other Topics Concern   Not on file  Social History Narrative   Not on file   Social Determinants of Health   Financial Resource Strain:    Difficulty of Paying Living Expenses: Not on file  Food Insecurity:    Worried About Charity fundraiser in the Last Year: Not on file   [employer] of Food in the Last Year: Not on file  Transportation Needs:    Lack of  Transportation (Medical): Not on file   Lack of Transportation (Non-Medical): Not on file  Physical Activity:    Days of Exercise per Week: Not on file   Minutes of Exercise per Session: Not on file  Stress:    Feeling of Stress : Not on file  Social Connections:    Frequency of Communication with Friends and Family: Not on file   Frequency of Social Gatherings with Friends and Family: Not on file   Attends Religious Services: Not on file   Active Member of Clubs or  Organizations: Not on file   Attends Archivist Meetings: Not on file   Marital Status: Not on file     Family History: The patient's family history includes Cancer in her brother; Heart attack in her father; Other in her mother. ROS:   Please see the history of present illness.    All other systems reviewed and are negative.  EKGs/Labs/Other Studies Reviewed:    The following studies were reviewed today:    Recent Labs: 09/30/2019: ALT 11; BUN 17; Creatinine, Ser 0.79; Potassium 3.9; Sodium 139  Recent Lipid Panel    Component Value Date/Time   CHOL 152 09/30/2019 1450   TRIG 124 09/30/2019 1450   HDL 58 09/30/2019 1450   CHOLHDL 2.6 09/30/2019 1450   LDLCALC 72 09/30/2019 1450    Physical Exam:    VS:  BP 118/85    Pulse 76    Ht 5\' 2"  (1.575 m)    Wt 169 lb (76.7 kg)    SpO2 95%    BMI 30.91 kg/m     Wt Readings from Last 3 Encounters:  07/13/20 169 lb (76.7 kg)  09/30/19 166 lb 12.8 oz (75.7 kg)  05/02/19 165 lb (74.8 kg)     GEN:  Well nourished, well developed in no acute distress HEENT: Normal NECK: No JVD; No carotid bruits LYMPHATICS: No lymphadenopathy CARDIAC: 2/6 to 3/6 midsystolic murmur aortic area no radiation S2 normal RRR, \ RESPIRATORY:  Clear to auscultation without rales, wheezing or rhonchi  ABDOMEN: Soft, non-tender, non-distended MUSCULOSKELETAL:  No edema; No deformity  SKIN: Warm and dry NEUROLOGIC:  Alert and oriented x 3 PSYCHIATRIC:  Normal affect      Signed, Shirlee More, MD  07/13/2020 1:35 PM    [facility]

## 2020-10-12 ENCOUNTER — Telehealth: Payer: Self-pay | Admitting: Cardiology

## 2020-10-12 NOTE — Telephone Encounter (Signed)
Yes 1 only have her stop for 2 weeks and then can resume

## 2020-10-12 NOTE — Telephone Encounter (Signed)
New Message:      Pt wants to know if she can stop taking her Plavix, she have been having nosebleeds.

## 2020-10-12 NOTE — Telephone Encounter (Signed)
Spoke to the patient just now and let her know Dr. Joya Gaskins recommendations and she verbalizes understanding.    Encouraged patient to call back with any questions or concerns.

## 2020-10-23 ENCOUNTER — Telehealth: Payer: Self-pay | Admitting: Cardiology

## 2020-10-23 DIAGNOSIS — R040 Epistaxis: Secondary | ICD-10-CM

## 2020-10-23 NOTE — Telephone Encounter (Signed)
I think there is 2 issues the first is she should go and see ENT because there is usually a reason for a nosebleed and she likely needs to have a cautery of the bleeding point  Hold on restarting Plavix until she is seen by ENT

## 2020-10-23 NOTE — Telephone Encounter (Signed)
Pt c/o medication issue:  1. Name of Medication: clopidogrel (PLAVIX) 75 MG tablet  2. How are you currently taking this medication (dosage and times per day)? Not currently taking   3. Are you having a reaction (difficulty breathing--STAT)? No  4. What is your medication issue? Ashley Ruiz is calling stating she has been off of her Plavix for at least 10 days due to Dr. Bettina Gavia instructing her to do so until her nose bleeds are under control. She states she has not had a nose bleed since last week and it was very mild. She is wanting to know when Dr. Bettina Gavia feels she should begin taking it again. Please advise.

## 2020-10-23 NOTE — Telephone Encounter (Signed)
Spoke to the patient just now and let her know Dr. Joya Gaskins recommendations. She verbalizes understanding and thanks me for the call back.

## 2020-10-30 NOTE — Telephone Encounter (Signed)
Spoke to the patient just now and let her know that Dr. Bettina Gavia does want her to hold this until she is seen by the ENT doctor. She verbalizes understanding and thanks me for the call back.

## 2020-10-30 NOTE — Telephone Encounter (Signed)
Patient is still holding her Plavix but has not heard from ENT. I gave her the phone number to call Dr. Lucia Gaskins to f/u on referral but she wants to know if she needs to keep holding her plavix. Please advise.

## 2020-11-08 ENCOUNTER — Other Ambulatory Visit: Payer: Self-pay

## 2020-11-08 ENCOUNTER — Ambulatory Visit (INDEPENDENT_AMBULATORY_CARE_PROVIDER_SITE_OTHER): Payer: Medicare Other | Admitting: Otolaryngology

## 2020-11-08 VITALS — Temp 97.5°F

## 2020-11-08 DIAGNOSIS — R040 Epistaxis: Secondary | ICD-10-CM | POA: Diagnosis not present

## 2020-11-08 NOTE — Progress Notes (Signed)
HPI: Ashley Ruiz is a 86 y.o. female who presents is referred by Dr. Bettina Gavia for evaluation of recurrent right-sided epistaxis.  She has had problems with nosebleeds for a number of years.  She had previously had it cauterized a number of years ago down in [place].  It is generally from the right side.  More recently she has been having recurrent nosebleeds that generally stop spontaneously but have been recurring frequently.  She has been on Plavix but this is been stopped because of the nosebleeds.  She had a small nosebleed yesterday..  Past Medical History:  Diagnosis Date  . Aortic stenosis    peak/mean pressure gradient 28/16 mmHg by echo at [facility] 12/28/15  . Arthritis    OA AND PAIN BOTH KNEES; SOME PAIN IN RT HIP  . Cancer (HCC)    SKIN CANCER ON LT EAR  . Essential hypertension 02/01/2016  . Heart murmur   . Hip arthritis 02/01/2016  . History of anemia   . History of gallstones   . History of total knee replacement, bilateral 05/16/2019  . Hypercholesteremia   . Hyperlipidemia 02/01/2016  . Hypertension   . PONV (postoperative nausea and vomiting)   . Stroke [facility])    ?   SMALL TIA , PATIENT HAD 1 EPISODE DOUBLE VISION 12/2014  . TIA (transient ischemic attack) 02/09/2017   Past Surgical History:  Procedure Laterality Date  . ABDOMINAL HYSTERECTOMY  ? 2005  . CHOLECYSTECTOMY  ? 1982  . COLONOSCOPY    . TONSILLECTOMY  1943  . TOTAL HIP ARTHROPLASTY Right 03/12/2016   Procedure: RIGHT TOTAL HIP ARTHROPLASTY ANTERIOR APPROACH WITH LEFT KNEE INJECTION;  Surgeon: Gaynelle Arabian, MD;  Location: [facility];  Service: Orthopedics;  Laterality: Right;  . TOTAL KNEE ARTHROPLASTY Right 01/23/2014   Procedure: RIGHT TOTAL KNEE ARTHROPLASTY;  Surgeon: [name], MD;  Location: [facility];  Service: Orthopedics;  Laterality: Right;  . TOTAL KNEE ARTHROPLASTY Left 05/02/2019   Procedure: TOTAL KNEE ARTHROPLASTY;  Surgeon: Gaynelle Arabian, MD;  Location: [facility];  Service: Orthopedics;   Laterality: Left;  15min   Social History   Socioeconomic History  . Marital status: Widowed    Spouse name: Not on file  . Number of children: Not on file  . Years of education: Not on file  . Highest education level: Not on file  Occupational History  . Not on file  Tobacco Use  . Smoking status: Former Smoker    Packs/day: 0.25    Years: 10.00    Pack years: 2.50    Types: Cigarettes    Quit date: 04/12/1985    Years since quitting: 35.6  . Smokeless tobacco: Never Used  Vaping Use  . Vaping Use: Never used  Substance and Sexual Activity  . Alcohol use: Not Currently    Comment: RARE GLASS WINE  . Drug use: No  . Sexual activity: Not on file  Other Topics Concern  . Not on file  Social History Narrative  . Not on file   Social Determinants of Health   Financial Resource Strain: Not on file  Food Insecurity: Not on file  Transportation Needs: Not on file  Physical Activity: Not on file  Stress: Not on file  Social Connections: Not on file   Family History  Problem Relation Age of Onset  . Other Mother   . Heart attack Father   . Cancer Brother    Allergies  Allergen Reactions  . Biaxin [Clarithromycin] Nausea  And Vomiting  . Rifampin    Prior to Admission medications   Medication Sig Start Date End Date Taking? Authorizing Provider  amLODipine (NORVASC) 5 MG tablet Take 1 tablet (5 mg total) by mouth daily. 07/30/17   Skeet Latch, MD  Biotin 10000 MCG TABS Take 1,000 mcg by mouth daily.     [provider]  clopidogrel (PLAVIX) 75 MG tablet Take 75 mg by mouth daily. 01/30/17   [provider]  Coenzyme Q10 (CO Q 10) 100 MG CAPS Take 100 mg by mouth daily.     [provider]  Ergocalciferol 10 MCG (400 UNIT) TABS ergocalciferol (vitamin D2) 10 mcg (400 unit) tablet   400 units by oral route.    [provider]  latanoprost (XALATAN) 0.005 % ophthalmic solution Place 1 drop into the right eye at bedtime.     [provider]  losartan (COZAAR) 100 MG tablet Take 100 mg by mouth every morning.     [provider]  methocarbamol (ROBAXIN) 500 MG tablet Take 500 mg by mouth 3 (three) times daily as needed. 05/01/20   [provider]  simvastatin (ZOCOR) 10 MG tablet Take 10 mg by mouth at bedtime.     [provider]  spironolactone-hydrochlorothiazide (ALDACTAZIDE) 25-25 MG tablet Take 1 tablet by mouth every other day.    [provider]     Positive ROS: Otherwise negative  All other systems have been reviewed and were otherwise negative with the exception of those mentioned in the HPI and as above.  Physical Exam: Constitutional: Alert, well-appearing, no acute distress Ears: External ears without lesions or tenderness. Ear canals are clear bilaterally with intact, clear TMs.  Nasal: External nose without lesions. Septum is moderately deviated to the left.  She has concavity on the right side of the septum.  There is little bit of blood clot within this area that was cleaned with suction.  There are several small vessels in Kiesselbach's plexus.  After cleaning the blood clot and rubbing this area gently with a Q-tip as well as her blowing her nose she had no bleeding in the office today.  I cannot definitely determine which vessel has been bleeding and no cauterization was performed.. Clear nasal passages otherwise. Oral: Lips and gums without lesions. Tongue and palate mucosa without lesions. Posterior oropharynx clear. Neck: No palpable adenopathy or masses Respiratory: Breathing comfortably  Skin: No facial/neck lesions or rash noted.  Procedures  Assessment: Recurrent right-sided epistaxis with septal deviation.  Plan: I cannot determine the site of origin of her recent epistaxis. Reviewed with the patient as well as her daughter-in-law concerning using cotton balls with Afrin in order to stop the nosebleeds when she is having 1.  She will follow-up here  for recheck and possible cauterization if she continues to have nosebleeds.  Discussed with them that I would have to identify the site of origin of the bleeding in order to cauterize.   Radene Journey, MD   CC:

## 2020-11-12 NOTE — Telephone Encounter (Signed)
Patient is following up regarding holding Plavix. She states she saw ENT, but the doctor did not advise on whether or not she needs to continue holding the medication. She states she would like to know what she needs to do moving forward. Please advise.

## 2020-11-12 NOTE — Telephone Encounter (Signed)
I did see that I looked at the note from ENT and I think at this time the best choice is just not to restart Plavix.

## 2020-11-12 NOTE — Telephone Encounter (Signed)
Recommendation of not restarting Plavix was given to pt. Pt verbalized understanding and had no additional questions.

## 2020-12-03 DIAGNOSIS — M9902 Segmental and somatic dysfunction of thoracic region: Secondary | ICD-10-CM | POA: Diagnosis not present

## 2020-12-03 DIAGNOSIS — M4724 Other spondylosis with radiculopathy, thoracic region: Secondary | ICD-10-CM | POA: Diagnosis not present

## 2020-12-04 DIAGNOSIS — M4724 Other spondylosis with radiculopathy, thoracic region: Secondary | ICD-10-CM | POA: Diagnosis not present

## 2020-12-04 DIAGNOSIS — M9902 Segmental and somatic dysfunction of thoracic region: Secondary | ICD-10-CM | POA: Diagnosis not present

## 2020-12-19 DIAGNOSIS — S46812D Strain of other muscles, fascia and tendons at shoulder and upper arm level, left arm, subsequent encounter: Secondary | ICD-10-CM | POA: Diagnosis not present

## 2020-12-19 DIAGNOSIS — M159 Polyosteoarthritis, unspecified: Secondary | ICD-10-CM | POA: Diagnosis not present

## 2020-12-19 DIAGNOSIS — S29012D Strain of muscle and tendon of back wall of thorax, subsequent encounter: Secondary | ICD-10-CM | POA: Diagnosis not present

## 2020-12-19 DIAGNOSIS — M62830 Muscle spasm of back: Secondary | ICD-10-CM | POA: Diagnosis not present

## 2021-01-02 DIAGNOSIS — M5489 Other dorsalgia: Secondary | ICD-10-CM | POA: Diagnosis not present

## 2021-01-02 DIAGNOSIS — M2560 Stiffness of unspecified joint, not elsewhere classified: Secondary | ICD-10-CM | POA: Diagnosis not present

## 2021-01-02 DIAGNOSIS — R293 Abnormal posture: Secondary | ICD-10-CM | POA: Diagnosis not present

## 2021-01-02 DIAGNOSIS — M6281 Muscle weakness (generalized): Secondary | ICD-10-CM | POA: Diagnosis not present

## 2021-01-02 DIAGNOSIS — M25612 Stiffness of left shoulder, not elsewhere classified: Secondary | ICD-10-CM | POA: Diagnosis not present

## 2021-01-02 DIAGNOSIS — M62830 Muscle spasm of back: Secondary | ICD-10-CM | POA: Diagnosis not present

## 2021-01-07 DIAGNOSIS — M5489 Other dorsalgia: Secondary | ICD-10-CM | POA: Diagnosis not present

## 2021-01-07 DIAGNOSIS — M6281 Muscle weakness (generalized): Secondary | ICD-10-CM | POA: Diagnosis not present

## 2021-01-07 DIAGNOSIS — M2560 Stiffness of unspecified joint, not elsewhere classified: Secondary | ICD-10-CM | POA: Diagnosis not present

## 2021-01-07 DIAGNOSIS — M25612 Stiffness of left shoulder, not elsewhere classified: Secondary | ICD-10-CM | POA: Diagnosis not present

## 2021-01-07 DIAGNOSIS — R293 Abnormal posture: Secondary | ICD-10-CM | POA: Diagnosis not present

## 2021-01-07 DIAGNOSIS — M62830 Muscle spasm of back: Secondary | ICD-10-CM | POA: Diagnosis not present

## 2021-01-10 DIAGNOSIS — M5489 Other dorsalgia: Secondary | ICD-10-CM | POA: Diagnosis not present

## 2021-01-10 DIAGNOSIS — M2560 Stiffness of unspecified joint, not elsewhere classified: Secondary | ICD-10-CM | POA: Diagnosis not present

## 2021-01-10 DIAGNOSIS — M6281 Muscle weakness (generalized): Secondary | ICD-10-CM | POA: Diagnosis not present

## 2021-01-10 DIAGNOSIS — R293 Abnormal posture: Secondary | ICD-10-CM | POA: Diagnosis not present

## 2021-01-10 DIAGNOSIS — M62830 Muscle spasm of back: Secondary | ICD-10-CM | POA: Diagnosis not present

## 2021-01-10 DIAGNOSIS — M25612 Stiffness of left shoulder, not elsewhere classified: Secondary | ICD-10-CM | POA: Diagnosis not present

## 2021-01-16 DIAGNOSIS — R293 Abnormal posture: Secondary | ICD-10-CM | POA: Diagnosis not present

## 2021-01-16 DIAGNOSIS — M25612 Stiffness of left shoulder, not elsewhere classified: Secondary | ICD-10-CM | POA: Diagnosis not present

## 2021-01-16 DIAGNOSIS — M62830 Muscle spasm of back: Secondary | ICD-10-CM | POA: Diagnosis not present

## 2021-01-16 DIAGNOSIS — M6281 Muscle weakness (generalized): Secondary | ICD-10-CM | POA: Diagnosis not present

## 2021-01-16 DIAGNOSIS — M5489 Other dorsalgia: Secondary | ICD-10-CM | POA: Diagnosis not present

## 2021-01-16 DIAGNOSIS — M2560 Stiffness of unspecified joint, not elsewhere classified: Secondary | ICD-10-CM | POA: Diagnosis not present

## 2021-01-17 DIAGNOSIS — M25612 Stiffness of left shoulder, not elsewhere classified: Secondary | ICD-10-CM | POA: Diagnosis not present

## 2021-01-17 DIAGNOSIS — M5489 Other dorsalgia: Secondary | ICD-10-CM | POA: Diagnosis not present

## 2021-01-17 DIAGNOSIS — M2560 Stiffness of unspecified joint, not elsewhere classified: Secondary | ICD-10-CM | POA: Diagnosis not present

## 2021-01-17 DIAGNOSIS — M62830 Muscle spasm of back: Secondary | ICD-10-CM | POA: Diagnosis not present

## 2021-01-17 DIAGNOSIS — R293 Abnormal posture: Secondary | ICD-10-CM | POA: Diagnosis not present

## 2021-01-17 DIAGNOSIS — M6281 Muscle weakness (generalized): Secondary | ICD-10-CM | POA: Diagnosis not present

## 2021-01-21 DIAGNOSIS — M62830 Muscle spasm of back: Secondary | ICD-10-CM | POA: Diagnosis not present

## 2021-01-21 DIAGNOSIS — R293 Abnormal posture: Secondary | ICD-10-CM | POA: Diagnosis not present

## 2021-01-21 DIAGNOSIS — M5489 Other dorsalgia: Secondary | ICD-10-CM | POA: Diagnosis not present

## 2021-01-21 DIAGNOSIS — M2560 Stiffness of unspecified joint, not elsewhere classified: Secondary | ICD-10-CM | POA: Diagnosis not present

## 2021-01-21 DIAGNOSIS — M25612 Stiffness of left shoulder, not elsewhere classified: Secondary | ICD-10-CM | POA: Diagnosis not present

## 2021-01-21 DIAGNOSIS — M6281 Muscle weakness (generalized): Secondary | ICD-10-CM | POA: Diagnosis not present

## 2021-01-23 DIAGNOSIS — L820 Inflamed seborrheic keratosis: Secondary | ICD-10-CM | POA: Diagnosis not present

## 2021-01-25 DIAGNOSIS — M2560 Stiffness of unspecified joint, not elsewhere classified: Secondary | ICD-10-CM | POA: Diagnosis not present

## 2021-01-25 DIAGNOSIS — M5489 Other dorsalgia: Secondary | ICD-10-CM | POA: Diagnosis not present

## 2021-01-25 DIAGNOSIS — M25612 Stiffness of left shoulder, not elsewhere classified: Secondary | ICD-10-CM | POA: Diagnosis not present

## 2021-01-25 DIAGNOSIS — R293 Abnormal posture: Secondary | ICD-10-CM | POA: Diagnosis not present

## 2021-01-25 DIAGNOSIS — M62830 Muscle spasm of back: Secondary | ICD-10-CM | POA: Diagnosis not present

## 2021-01-29 DIAGNOSIS — M2560 Stiffness of unspecified joint, not elsewhere classified: Secondary | ICD-10-CM | POA: Diagnosis not present

## 2021-01-29 DIAGNOSIS — M62830 Muscle spasm of back: Secondary | ICD-10-CM | POA: Diagnosis not present

## 2021-01-29 DIAGNOSIS — R293 Abnormal posture: Secondary | ICD-10-CM | POA: Diagnosis not present

## 2021-01-29 DIAGNOSIS — M5489 Other dorsalgia: Secondary | ICD-10-CM | POA: Diagnosis not present

## 2021-01-29 DIAGNOSIS — M25612 Stiffness of left shoulder, not elsewhere classified: Secondary | ICD-10-CM | POA: Diagnosis not present

## 2021-02-05 DIAGNOSIS — M25612 Stiffness of left shoulder, not elsewhere classified: Secondary | ICD-10-CM | POA: Diagnosis not present

## 2021-02-05 DIAGNOSIS — M5489 Other dorsalgia: Secondary | ICD-10-CM | POA: Diagnosis not present

## 2021-02-05 DIAGNOSIS — R293 Abnormal posture: Secondary | ICD-10-CM | POA: Diagnosis not present

## 2021-02-05 DIAGNOSIS — M62830 Muscle spasm of back: Secondary | ICD-10-CM | POA: Diagnosis not present

## 2021-02-05 DIAGNOSIS — M2560 Stiffness of unspecified joint, not elsewhere classified: Secondary | ICD-10-CM | POA: Diagnosis not present

## 2021-02-07 DIAGNOSIS — R293 Abnormal posture: Secondary | ICD-10-CM | POA: Diagnosis not present

## 2021-02-07 DIAGNOSIS — M2560 Stiffness of unspecified joint, not elsewhere classified: Secondary | ICD-10-CM | POA: Diagnosis not present

## 2021-02-07 DIAGNOSIS — M25612 Stiffness of left shoulder, not elsewhere classified: Secondary | ICD-10-CM | POA: Diagnosis not present

## 2021-02-07 DIAGNOSIS — M62830 Muscle spasm of back: Secondary | ICD-10-CM | POA: Diagnosis not present

## 2021-02-07 DIAGNOSIS — M5489 Other dorsalgia: Secondary | ICD-10-CM | POA: Diagnosis not present

## 2021-02-12 DIAGNOSIS — M5489 Other dorsalgia: Secondary | ICD-10-CM | POA: Diagnosis not present

## 2021-02-12 DIAGNOSIS — M25612 Stiffness of left shoulder, not elsewhere classified: Secondary | ICD-10-CM | POA: Diagnosis not present

## 2021-02-12 DIAGNOSIS — M2560 Stiffness of unspecified joint, not elsewhere classified: Secondary | ICD-10-CM | POA: Diagnosis not present

## 2021-02-12 DIAGNOSIS — R293 Abnormal posture: Secondary | ICD-10-CM | POA: Diagnosis not present

## 2021-02-12 DIAGNOSIS — M62830 Muscle spasm of back: Secondary | ICD-10-CM | POA: Diagnosis not present

## 2021-02-13 DIAGNOSIS — C44622 Squamous cell carcinoma of skin of right upper limb, including shoulder: Secondary | ICD-10-CM | POA: Diagnosis not present

## 2021-02-14 DIAGNOSIS — M62830 Muscle spasm of back: Secondary | ICD-10-CM | POA: Diagnosis not present

## 2021-02-14 DIAGNOSIS — R293 Abnormal posture: Secondary | ICD-10-CM | POA: Diagnosis not present

## 2021-02-14 DIAGNOSIS — M5489 Other dorsalgia: Secondary | ICD-10-CM | POA: Diagnosis not present

## 2021-02-14 DIAGNOSIS — M25612 Stiffness of left shoulder, not elsewhere classified: Secondary | ICD-10-CM | POA: Diagnosis not present

## 2021-02-14 DIAGNOSIS — M2560 Stiffness of unspecified joint, not elsewhere classified: Secondary | ICD-10-CM | POA: Diagnosis not present

## 2021-02-19 DIAGNOSIS — M25612 Stiffness of left shoulder, not elsewhere classified: Secondary | ICD-10-CM | POA: Diagnosis not present

## 2021-02-19 DIAGNOSIS — M2560 Stiffness of unspecified joint, not elsewhere classified: Secondary | ICD-10-CM | POA: Diagnosis not present

## 2021-02-19 DIAGNOSIS — R293 Abnormal posture: Secondary | ICD-10-CM | POA: Diagnosis not present

## 2021-02-19 DIAGNOSIS — M5489 Other dorsalgia: Secondary | ICD-10-CM | POA: Diagnosis not present

## 2021-02-19 DIAGNOSIS — M62830 Muscle spasm of back: Secondary | ICD-10-CM | POA: Diagnosis not present

## 2021-02-20 ENCOUNTER — Telehealth: Payer: Self-pay | Admitting: Cardiology

## 2021-02-20 NOTE — Telephone Encounter (Signed)
Pt is calling all of her doctors about Covid Vaccine Booster Shot and she wanted to get Dr. Joya Gaskins  recommendations

## 2021-02-20 NOTE — Telephone Encounter (Signed)
Spoke to the patient just now and let her know that Dr. Bettina Gavia does recommend getting this booster shot. She verbalizes understanding and thanks me for calling her back.

## 2021-02-21 DIAGNOSIS — M62830 Muscle spasm of back: Secondary | ICD-10-CM | POA: Diagnosis not present

## 2021-02-21 DIAGNOSIS — M2560 Stiffness of unspecified joint, not elsewhere classified: Secondary | ICD-10-CM | POA: Diagnosis not present

## 2021-02-21 DIAGNOSIS — M5489 Other dorsalgia: Secondary | ICD-10-CM | POA: Diagnosis not present

## 2021-02-21 DIAGNOSIS — R293 Abnormal posture: Secondary | ICD-10-CM | POA: Diagnosis not present

## 2021-02-21 DIAGNOSIS — M25612 Stiffness of left shoulder, not elsewhere classified: Secondary | ICD-10-CM | POA: Diagnosis not present

## 2021-02-26 DIAGNOSIS — M6281 Muscle weakness (generalized): Secondary | ICD-10-CM | POA: Diagnosis not present

## 2021-02-26 DIAGNOSIS — R293 Abnormal posture: Secondary | ICD-10-CM | POA: Diagnosis not present

## 2021-02-26 DIAGNOSIS — M5489 Other dorsalgia: Secondary | ICD-10-CM | POA: Diagnosis not present

## 2021-02-26 DIAGNOSIS — M62830 Muscle spasm of back: Secondary | ICD-10-CM | POA: Diagnosis not present

## 2021-02-26 DIAGNOSIS — M2560 Stiffness of unspecified joint, not elsewhere classified: Secondary | ICD-10-CM | POA: Diagnosis not present

## 2021-02-26 DIAGNOSIS — M25612 Stiffness of left shoulder, not elsewhere classified: Secondary | ICD-10-CM | POA: Diagnosis not present

## 2021-02-28 DIAGNOSIS — M6281 Muscle weakness (generalized): Secondary | ICD-10-CM | POA: Diagnosis not present

## 2021-02-28 DIAGNOSIS — M5489 Other dorsalgia: Secondary | ICD-10-CM | POA: Diagnosis not present

## 2021-02-28 DIAGNOSIS — M2560 Stiffness of unspecified joint, not elsewhere classified: Secondary | ICD-10-CM | POA: Diagnosis not present

## 2021-02-28 DIAGNOSIS — R293 Abnormal posture: Secondary | ICD-10-CM | POA: Diagnosis not present

## 2021-02-28 DIAGNOSIS — M62830 Muscle spasm of back: Secondary | ICD-10-CM | POA: Diagnosis not present

## 2021-02-28 DIAGNOSIS — M25612 Stiffness of left shoulder, not elsewhere classified: Secondary | ICD-10-CM | POA: Diagnosis not present

## 2021-03-06 DIAGNOSIS — M2560 Stiffness of unspecified joint, not elsewhere classified: Secondary | ICD-10-CM | POA: Diagnosis not present

## 2021-03-06 DIAGNOSIS — R293 Abnormal posture: Secondary | ICD-10-CM | POA: Diagnosis not present

## 2021-03-06 DIAGNOSIS — M25612 Stiffness of left shoulder, not elsewhere classified: Secondary | ICD-10-CM | POA: Diagnosis not present

## 2021-03-06 DIAGNOSIS — M62830 Muscle spasm of back: Secondary | ICD-10-CM | POA: Diagnosis not present

## 2021-03-06 DIAGNOSIS — M5489 Other dorsalgia: Secondary | ICD-10-CM | POA: Diagnosis not present

## 2021-03-06 DIAGNOSIS — M6281 Muscle weakness (generalized): Secondary | ICD-10-CM | POA: Diagnosis not present

## 2021-03-12 DIAGNOSIS — M9902 Segmental and somatic dysfunction of thoracic region: Secondary | ICD-10-CM | POA: Diagnosis not present

## 2021-03-12 DIAGNOSIS — M4724 Other spondylosis with radiculopathy, thoracic region: Secondary | ICD-10-CM | POA: Diagnosis not present

## 2021-03-13 DIAGNOSIS — M4724 Other spondylosis with radiculopathy, thoracic region: Secondary | ICD-10-CM | POA: Diagnosis not present

## 2021-03-13 DIAGNOSIS — M9902 Segmental and somatic dysfunction of thoracic region: Secondary | ICD-10-CM | POA: Diagnosis not present

## 2021-03-14 DIAGNOSIS — M62830 Muscle spasm of back: Secondary | ICD-10-CM | POA: Diagnosis not present

## 2021-03-14 DIAGNOSIS — S29012D Strain of muscle and tendon of back wall of thorax, subsequent encounter: Secondary | ICD-10-CM | POA: Diagnosis not present

## 2021-03-14 DIAGNOSIS — M47819 Spondylosis without myelopathy or radiculopathy, site unspecified: Secondary | ICD-10-CM | POA: Diagnosis not present

## 2021-03-14 DIAGNOSIS — M25612 Stiffness of left shoulder, not elsewhere classified: Secondary | ICD-10-CM | POA: Diagnosis not present

## 2021-03-14 DIAGNOSIS — M6281 Muscle weakness (generalized): Secondary | ICD-10-CM | POA: Diagnosis not present

## 2021-03-14 DIAGNOSIS — R293 Abnormal posture: Secondary | ICD-10-CM | POA: Diagnosis not present

## 2021-03-14 DIAGNOSIS — S46812D Strain of other muscles, fascia and tendons at shoulder and upper arm level, left arm, subsequent encounter: Secondary | ICD-10-CM | POA: Diagnosis not present

## 2021-03-14 DIAGNOSIS — M5489 Other dorsalgia: Secondary | ICD-10-CM | POA: Diagnosis not present

## 2021-03-14 DIAGNOSIS — M2560 Stiffness of unspecified joint, not elsewhere classified: Secondary | ICD-10-CM | POA: Diagnosis not present

## 2021-03-14 DIAGNOSIS — M1991 Primary osteoarthritis, unspecified site: Secondary | ICD-10-CM | POA: Diagnosis not present

## 2021-03-15 DIAGNOSIS — M2560 Stiffness of unspecified joint, not elsewhere classified: Secondary | ICD-10-CM | POA: Diagnosis not present

## 2021-03-15 DIAGNOSIS — M6281 Muscle weakness (generalized): Secondary | ICD-10-CM | POA: Diagnosis not present

## 2021-03-15 DIAGNOSIS — M5489 Other dorsalgia: Secondary | ICD-10-CM | POA: Diagnosis not present

## 2021-03-15 DIAGNOSIS — R293 Abnormal posture: Secondary | ICD-10-CM | POA: Diagnosis not present

## 2021-03-15 DIAGNOSIS — M25612 Stiffness of left shoulder, not elsewhere classified: Secondary | ICD-10-CM | POA: Diagnosis not present

## 2021-03-15 DIAGNOSIS — M62830 Muscle spasm of back: Secondary | ICD-10-CM | POA: Diagnosis not present

## 2021-03-18 DIAGNOSIS — M4724 Other spondylosis with radiculopathy, thoracic region: Secondary | ICD-10-CM | POA: Diagnosis not present

## 2021-03-18 DIAGNOSIS — M9902 Segmental and somatic dysfunction of thoracic region: Secondary | ICD-10-CM | POA: Diagnosis not present

## 2021-03-20 DIAGNOSIS — M47819 Spondylosis without myelopathy or radiculopathy, site unspecified: Secondary | ICD-10-CM | POA: Diagnosis not present

## 2021-03-20 DIAGNOSIS — M4186 Other forms of scoliosis, lumbar region: Secondary | ICD-10-CM | POA: Diagnosis not present

## 2021-03-20 DIAGNOSIS — M4184 Other forms of scoliosis, thoracic region: Secondary | ICD-10-CM | POA: Diagnosis not present

## 2021-03-20 DIAGNOSIS — M25511 Pain in right shoulder: Secondary | ICD-10-CM | POA: Diagnosis not present

## 2021-03-20 DIAGNOSIS — M5124 Other intervertebral disc displacement, thoracic region: Secondary | ICD-10-CM | POA: Diagnosis not present

## 2021-03-20 DIAGNOSIS — M47814 Spondylosis without myelopathy or radiculopathy, thoracic region: Secondary | ICD-10-CM | POA: Diagnosis not present

## 2021-03-20 DIAGNOSIS — M545 Low back pain, unspecified: Secondary | ICD-10-CM | POA: Diagnosis not present

## 2021-03-26 ENCOUNTER — Ambulatory Visit (INDEPENDENT_AMBULATORY_CARE_PROVIDER_SITE_OTHER): Payer: Medicare Other

## 2021-03-26 ENCOUNTER — Other Ambulatory Visit: Payer: Self-pay

## 2021-03-26 DIAGNOSIS — M62830 Muscle spasm of back: Secondary | ICD-10-CM | POA: Diagnosis not present

## 2021-03-26 DIAGNOSIS — I350 Nonrheumatic aortic (valve) stenosis: Secondary | ICD-10-CM

## 2021-03-26 DIAGNOSIS — M6281 Muscle weakness (generalized): Secondary | ICD-10-CM | POA: Diagnosis not present

## 2021-03-26 DIAGNOSIS — R293 Abnormal posture: Secondary | ICD-10-CM | POA: Diagnosis not present

## 2021-03-26 DIAGNOSIS — M2560 Stiffness of unspecified joint, not elsewhere classified: Secondary | ICD-10-CM | POA: Diagnosis not present

## 2021-03-26 DIAGNOSIS — M5489 Other dorsalgia: Secondary | ICD-10-CM | POA: Diagnosis not present

## 2021-03-26 DIAGNOSIS — M25612 Stiffness of left shoulder, not elsewhere classified: Secondary | ICD-10-CM | POA: Diagnosis not present

## 2021-03-26 LAB — ECHOCARDIOGRAM COMPLETE
AR max vel: 1.12 cm2
AV Area VTI: 1.08 cm2
AV Area mean vel: 1.08 cm2
AV Mean grad: 19.5 mmHg
AV Peak grad: 32.3 mmHg
Ao pk vel: 2.84 m/s
Area-P 1/2: 2.11 cm2
S' Lateral: 2.4 cm

## 2021-03-26 NOTE — Progress Notes (Signed)
Complete echocardiogram performed.  Jimmy Kinta Martis RDCS, RVT  

## 2021-03-28 DIAGNOSIS — R293 Abnormal posture: Secondary | ICD-10-CM | POA: Diagnosis not present

## 2021-03-28 DIAGNOSIS — M6281 Muscle weakness (generalized): Secondary | ICD-10-CM | POA: Diagnosis not present

## 2021-03-28 DIAGNOSIS — M25612 Stiffness of left shoulder, not elsewhere classified: Secondary | ICD-10-CM | POA: Diagnosis not present

## 2021-03-28 DIAGNOSIS — M62830 Muscle spasm of back: Secondary | ICD-10-CM | POA: Diagnosis not present

## 2021-03-28 DIAGNOSIS — M2560 Stiffness of unspecified joint, not elsewhere classified: Secondary | ICD-10-CM | POA: Diagnosis not present

## 2021-03-28 DIAGNOSIS — M5489 Other dorsalgia: Secondary | ICD-10-CM | POA: Diagnosis not present

## 2021-04-01 ENCOUNTER — Telehealth: Payer: Self-pay

## 2021-04-01 DIAGNOSIS — M62830 Muscle spasm of back: Secondary | ICD-10-CM | POA: Diagnosis not present

## 2021-04-01 DIAGNOSIS — M6281 Muscle weakness (generalized): Secondary | ICD-10-CM | POA: Diagnosis not present

## 2021-04-01 DIAGNOSIS — M5489 Other dorsalgia: Secondary | ICD-10-CM | POA: Diagnosis not present

## 2021-04-01 DIAGNOSIS — M25612 Stiffness of left shoulder, not elsewhere classified: Secondary | ICD-10-CM | POA: Diagnosis not present

## 2021-04-01 DIAGNOSIS — M2560 Stiffness of unspecified joint, not elsewhere classified: Secondary | ICD-10-CM | POA: Diagnosis not present

## 2021-04-01 DIAGNOSIS — R293 Abnormal posture: Secondary | ICD-10-CM | POA: Diagnosis not present

## 2021-04-01 NOTE — Telephone Encounter (Signed)
Spoke with patient regarding results and recommendation.  Patient verbalizes understanding and is agreeable to plan of care. Advised patient to call back with any issues or concerns.  

## 2021-04-01 NOTE — Telephone Encounter (Signed)
-----   Message from Richardo Priest, MD sent at 03/29/2021  1:18 PM EDT ----- Stable result for aortic stenosis

## 2021-04-05 DIAGNOSIS — M62830 Muscle spasm of back: Secondary | ICD-10-CM | POA: Diagnosis not present

## 2021-04-05 DIAGNOSIS — M2560 Stiffness of unspecified joint, not elsewhere classified: Secondary | ICD-10-CM | POA: Diagnosis not present

## 2021-04-05 DIAGNOSIS — M6281 Muscle weakness (generalized): Secondary | ICD-10-CM | POA: Diagnosis not present

## 2021-04-05 DIAGNOSIS — M5489 Other dorsalgia: Secondary | ICD-10-CM | POA: Diagnosis not present

## 2021-04-05 DIAGNOSIS — M25612 Stiffness of left shoulder, not elsewhere classified: Secondary | ICD-10-CM | POA: Diagnosis not present

## 2021-04-05 DIAGNOSIS — R293 Abnormal posture: Secondary | ICD-10-CM | POA: Diagnosis not present

## 2021-04-08 DIAGNOSIS — M6281 Muscle weakness (generalized): Secondary | ICD-10-CM | POA: Diagnosis not present

## 2021-04-08 DIAGNOSIS — M2560 Stiffness of unspecified joint, not elsewhere classified: Secondary | ICD-10-CM | POA: Diagnosis not present

## 2021-04-08 DIAGNOSIS — M25612 Stiffness of left shoulder, not elsewhere classified: Secondary | ICD-10-CM | POA: Diagnosis not present

## 2021-04-08 DIAGNOSIS — M62830 Muscle spasm of back: Secondary | ICD-10-CM | POA: Diagnosis not present

## 2021-04-08 DIAGNOSIS — R293 Abnormal posture: Secondary | ICD-10-CM | POA: Diagnosis not present

## 2021-04-08 DIAGNOSIS — M5489 Other dorsalgia: Secondary | ICD-10-CM | POA: Diagnosis not present

## 2021-04-08 NOTE — Progress Notes (Signed)
Cardiology Office Note:    Date:  0/86/7619   ID:  Ashley Ruiz, DOB [birth year], MRN [id number]  PCP:  Street, Sharon Mt, MD  Cardiologist:  Shirlee More, MD    Referring MD: Street, Sharon Mt, *    ASSESSMENT:    1. Moderate aortic stenosis   2. Hypertensive heart disease without heart failure   3. Mixed hyperlipidemia    PLAN:    In order of problems listed above:  1. Fortunately her aortic stenosis has plateaued has not progressed she is asymptomatic repeat echocardiogram in 6 months and if she develops severe symptomatic aortic stenosis would benefit from TAVR. 2. Stable BP at target with muscle cramps check her potassium and magnesium level continue her ARB and mixed diuretic thiazide MRA Continue  statin reduced 2 days a week with muscle symptoms Plavix have been discontinued with epistasis when asked her to start aspirin 81 mg daily  Next appointment: Echo 6 months see me a few weeks after   Medication Adjustments/Labs and Tests Ordered: Current medicines are reviewed at length with the patient today.  Concerns regarding medicines are outlined above.  No orders of the defined types were placed in this encounter.  No orders of the defined types were placed in this encounter.   Chief Complaint  Patient presents with  . Follow-up  . Aortic Stenosis    History of Present Illness:    Ashley Ruiz is a 87 y.o. female with a hx of aortic stenosis hypertension and hyperlipidemia  last seen 07/13/2020.  Compliance with diet, lifestyle and medications: Yes  She is in good spirits lives independently has no cardiovascular symptoms of chest pain edema shortness of breath or syncope. She is having severe muscle cramps will reduce her statin to 2 days a week and check labs including potassium and magnesium.  Echo 03/26/2021:  1. Left ventricular ejection fraction, by estimation, is 60 to 65%. The  left ventricle has normal function. The left ventricle has  no regional  wall motion abnormalities. There is moderate concentric left ventricular  hypertrophy. Left ventricular  diastolic parameters are consistent with Grade I diastolic dysfunction  (impaired relaxation).   2. Right ventricular systolic function is normal. The right ventricular  size is mildly enlarged. There is normal pulmonary artery systolic  pressure.   3. The mitral valve is normal in structure. No evidence of mitral valve  regurgitation. No evidence of mitral stenosis.   4. Tricuspid valve regurgitation is mild to moderate.   5. The aortic valve is calcified. Aortic valve regurgitation is trivial.  Moderate aortic valve stenosis. Aortic valve area, by VTI measures 1.08  cm. Aortic valve mean gradient measures 19.5 mmHg. Aortic valve Vmax  measures 2.84 m/s SVI 40 cc/m2  AV Peak Grad:   32.3 mmHg  AV Mean Grad:   19.5 mmHg  LVOT/AV VTI ratio: 0.34   Past Medical History:  Diagnosis Date  . Aortic stenosis    peak/mean pressure gradient 28/16 mmHg by echo at [facility] 12/28/15  . Arthritis    OA AND PAIN BOTH KNEES; SOME PAIN IN RT HIP  . Cancer (HCC)    SKIN CANCER ON LT EAR  . Essential hypertension 02/01/2016  . Heart murmur   . Hip arthritis 02/01/2016  . History of anemia   . History of gallstones   . History of total knee replacement, bilateral 05/16/2019  . Hypercholesteremia   . Hyperlipidemia 02/01/2016  . Hypertension   . PONV (postoperative  nausea and vomiting)   . Stroke [facility])    ?   SMALL TIA , PATIENT HAD 1 EPISODE DOUBLE VISION 12/2014  . TIA (transient ischemic attack) 02/09/2017    Past Surgical History:  Procedure Laterality Date  . ABDOMINAL HYSTERECTOMY  ? 2005  . CHOLECYSTECTOMY  ? 1982  . COLONOSCOPY    . TONSILLECTOMY  1943  . TOTAL HIP ARTHROPLASTY Right 03/12/2016   Procedure: RIGHT TOTAL HIP ARTHROPLASTY ANTERIOR APPROACH WITH LEFT KNEE INJECTION;  Surgeon: Gaynelle Arabian, MD;  Location: [facility];  Service: Orthopedics;   Laterality: Right;  . TOTAL KNEE ARTHROPLASTY Right 01/23/2014   Procedure: RIGHT TOTAL KNEE ARTHROPLASTY;  Surgeon: [name], MD;  Location: [facility];  Service: Orthopedics;  Laterality: Right;  . TOTAL KNEE ARTHROPLASTY Left 05/02/2019   Procedure: TOTAL KNEE ARTHROPLASTY;  Surgeon: Gaynelle Arabian, MD;  Location: [facility];  Service: Orthopedics;  Laterality: Left;  14min    Current Medications: Current Meds  Medication Sig  . amLODipine (NORVASC) 5 MG tablet Take 1 tablet (5 mg total) by mouth daily.  . Biotin 5000 MCG TABS Take 5,000 mcg by mouth daily.  . Coenzyme Q10 (CO Q 10) 100 MG CAPS Take 100 mg by mouth daily.   . Ergocalciferol 10 MCG (400 UNIT) TABS ergocalciferol (vitamin D2) 10 mcg (400 unit) tablet   400 units by oral route.  . latanoprost (XALATAN) 0.005 % ophthalmic solution Place 1 drop into the right eye at bedtime.   Marland Kitchen losartan (COZAAR) 100 MG tablet Take 100 mg by mouth every morning.   . methocarbamol (ROBAXIN) 750 MG tablet Take 750 mg by mouth 3 (three) times daily as needed for muscle spasms.  . simvastatin (ZOCOR) 10 MG tablet Take 10 mg by mouth 2 (two) times a week.  . spironolactone-hydrochlorothiazide (ALDACTAZIDE) 25-25 MG tablet Take 1 tablet by mouth every other day.  . traMADol (ULTRAM) 50 MG tablet Take 50 mg by mouth daily as needed for severe pain.  . [DISCONTINUED] methocarbamol (ROBAXIN) 500 MG tablet Take 500 mg by mouth 3 (three) times daily as needed.     Allergies:   Biaxin [clarithromycin] and Rifampin   Social History   Socioeconomic History  . Marital status: Widowed    Spouse name: Not on file  . Number of children: Not on file  . Years of education: Not on file  . Highest education level: Not on file  Occupational History  . Not on file  Tobacco Use  . Smoking status: Former Smoker    Packs/day: 0.25    Years: 10.00    Pack years: 2.50    Types: Cigarettes    Quit date: 04/12/1985    Years since quitting: 36.0  . Smokeless  tobacco: Never Used  Vaping Use  . Vaping Use: Never used  Substance and Sexual Activity  . Alcohol use: Not Currently    Comment: RARE GLASS WINE  . Drug use: No  . Sexual activity: Not on file  Other Topics Concern  . Not on file  Social History Narrative  . Not on file   Social Determinants of Health   Financial Resource Strain: Not on file  Food Insecurity: Not on file  Transportation Needs: Not on file  Physical Activity: Not on file  Stress: Not on file  Social Connections: Not on file     Family History: The patient's family history includes Cancer in her brother; Heart attack in her father; Other in her mother. ROS:  Please see the history of present illness.    All other systems reviewed and are negative.  EKGs/Labs/Other Studies Reviewed:    The following studies were reviewed today:    Recent Labs: No results found for requested labs within last 8760 hours.  Recent Lipid Panel    Component Value Date/Time   CHOL 152 09/30/2019 1450   TRIG 124 09/30/2019 1450   HDL 58 09/30/2019 1450   CHOLHDL 2.6 09/30/2019 1450   LDLCALC 72 09/30/2019 1450    Physical Exam:    VS:  BP 130/60 (BP Location: Left Arm, Patient Position: Sitting, Cuff Size: Normal)   Pulse 72   Ht 5\' 2"  (1.575 m)   Wt 163 lb 12.8 oz (74.3 kg)   SpO2 97%   BMI 29.96 kg/m     Wt Readings from Last 3 Encounters:  04/09/21 163 lb 12.8 oz (74.3 kg)  07/13/20 169 lb (76.7 kg)  09/30/19 166 lb 12.8 oz (75.7 kg)     GEN:  Well nourished, well developed in no acute distress HEENT: Normal NECK: No JVD; No carotid bruits LYMPHATICS: No lymphadenopathy CARDIAC: 3 of 6 AAS radiates up into the clavicular area S2 still splits no AR RRR, no , rubs, gallops RESPIRATORY:  Clear to auscultation without rales, wheezing or rhonchi  ABDOMEN: Soft, non-tender, non-distended MUSCULOSKELETAL:  No edema; No deformity  SKIN: Warm and dry NEUROLOGIC:  Alert and oriented x 3 PSYCHIATRIC:  Normal  affect    Signed, Shirlee More, MD  04/09/2021 4:34 PM    [facility]

## 2021-04-09 ENCOUNTER — Ambulatory Visit (INDEPENDENT_AMBULATORY_CARE_PROVIDER_SITE_OTHER): Payer: Medicare Other | Admitting: Cardiology

## 2021-04-09 ENCOUNTER — Other Ambulatory Visit: Payer: Self-pay

## 2021-04-09 ENCOUNTER — Encounter: Payer: Self-pay | Admitting: Cardiology

## 2021-04-09 VITALS — BP 130/60 | HR 72 | Ht 62.0 in | Wt 163.8 lb

## 2021-04-09 DIAGNOSIS — E782 Mixed hyperlipidemia: Secondary | ICD-10-CM

## 2021-04-09 DIAGNOSIS — I350 Nonrheumatic aortic (valve) stenosis: Secondary | ICD-10-CM

## 2021-04-09 DIAGNOSIS — I119 Hypertensive heart disease without heart failure: Secondary | ICD-10-CM

## 2021-04-09 NOTE — Patient Instructions (Signed)
Medication Instructions:  Your physician has recommended you make the following change in your medication:  DECREASE: Simvastatin take twice weekly.  *If you need a refill on your cardiac medications before your next appointment, please call your pharmacy*   Lab Work: Your physician recommends that you return for lab work in: TODAY CMP, Mag, Lipids, CBC If you have labs (blood work) drawn today and your tests are completely normal, you will receive your results only by: Marland Kitchen MyChart Message (if you have MyChart) OR . A paper copy in the mail If you have any lab test that is abnormal or we need to change your treatment, we will call you to review the results.   Testing/Procedures: Your physician has requested that you have an echocardiogram. Echocardiography is a painless test that uses sound waves to create images of your heart. It provides your doctor with information about the size and shape of your heart and how well your heart's chambers and valves are working. This procedure takes approximately one hour. There are no restrictions for this procedure.     Follow-Up: At [facility], you and your health needs are our priority.  As part of our continuing mission to provide you with exceptional heart care, we have created designated Provider Care Teams.  These Care Teams include your primary Cardiologist (physician) and Advanced Practice Providers (APPs -  Physician Assistants and Nurse Practitioners) who all work together to provide you with the care you need, when you need it.  We recommend signing up for the patient portal called "MyChart".  Sign up information is provided on this After Visit Summary.  MyChart is used to connect with patients for Virtual Visits (Telemedicine).  Patients are able to view lab/test results, encounter notes, upcoming appointments, etc.  Non-urgent messages can be sent to your provider as well.   To learn more about what you can do with MyChart, go to  [email or web address].    Your next appointment:   6 month(s) and after echo  The format for your next appointment:   In Person  Provider:   Shirlee More, MD   Other Instructions

## 2021-04-10 ENCOUNTER — Telehealth: Payer: Self-pay

## 2021-04-10 DIAGNOSIS — E611 Iron deficiency: Secondary | ICD-10-CM

## 2021-04-10 LAB — COMPREHENSIVE METABOLIC PANEL
ALT: 13 IU/L (ref 0–32)
AST: 18 IU/L (ref 0–40)
Albumin/Globulin Ratio: 1.8 (ref 1.2–2.2)
Albumin: 4.2 g/dL (ref 3.6–4.6)
Alkaline Phosphatase: 88 IU/L (ref 44–121)
BUN/Creatinine Ratio: 17 (ref 12–28)
BUN: 16 mg/dL (ref 8–27)
Bilirubin Total: 0.3 mg/dL (ref 0.0–1.2)
CO2: 23 mmol/L (ref 20–29)
Calcium: 10.3 mg/dL (ref 8.7–10.3)
Chloride: 100 mmol/L (ref 96–106)
Creatinine, Ser: 0.95 mg/dL (ref 0.57–1.00)
Globulin, Total: 2.4 g/dL (ref 1.5–4.5)
Glucose: 98 mg/dL (ref 65–99)
Potassium: 4.4 mmol/L (ref 3.5–5.2)
Sodium: 138 mmol/L (ref 134–144)
Total Protein: 6.6 g/dL (ref 6.0–8.5)
eGFR: 57 mL/min/{1.73_m2} — ABNORMAL LOW (ref >59)

## 2021-04-10 LAB — LIPID PANEL
Chol/HDL Ratio: 2.9 ratio (ref 0.0–4.4)
Cholesterol, Total: 165 mg/dL (ref 100–199)
HDL: 57 mg/dL (ref >39)
LDL Chol Calc (NIH): 88 mg/dL (ref 0–99)
Triglycerides: 110 mg/dL (ref 0–149)
VLDL Cholesterol Cal: 20 mg/dL (ref 5–40)

## 2021-04-10 LAB — CBC
Hematocrit: 38.1 % (ref 34.0–46.6)
Hemoglobin: 12 g/dL (ref 11.1–15.9)
MCH: 22.8 pg — ABNORMAL LOW (ref 26.6–33.0)
MCHC: 31.5 g/dL (ref 31.5–35.7)
MCV: 72 fL — ABNORMAL LOW (ref 79–97)
Platelets: 205 10*3/uL (ref 150–450)
RBC: 5.27 x10E6/uL (ref 3.77–5.28)
RDW: 15.4 % (ref 11.7–15.4)
WBC: 6.8 10*3/uL (ref 3.4–10.8)

## 2021-04-10 LAB — MAGNESIUM: Magnesium: 1.9 mg/dL (ref 1.6–2.3)

## 2021-04-10 NOTE — Telephone Encounter (Signed)
Spoke with patient regarding results and recommendation.  Patient verbalizes understanding and is agreeable to plan of care. Advised patient to call back with any issues or concerns.  

## 2021-04-11 DIAGNOSIS — E611 Iron deficiency: Secondary | ICD-10-CM | POA: Diagnosis not present

## 2021-04-11 DIAGNOSIS — M791 Myalgia, unspecified site: Secondary | ICD-10-CM | POA: Diagnosis not present

## 2021-04-11 DIAGNOSIS — M546 Pain in thoracic spine: Secondary | ICD-10-CM | POA: Diagnosis not present

## 2021-04-12 ENCOUNTER — Telehealth: Payer: Self-pay

## 2021-04-12 DIAGNOSIS — D509 Iron deficiency anemia, unspecified: Secondary | ICD-10-CM

## 2021-04-12 LAB — IRON,TIBC AND FERRITIN PANEL
Ferritin: 17 ng/mL (ref 15–150)
Iron Saturation: 7 % — CL (ref 15–55)
Iron: 29 ug/dL (ref 27–139)
Total Iron Binding Capacity: 429 ug/dL (ref 250–450)
UIBC: 400 ug/dL — ABNORMAL HIGH (ref 118–369)

## 2021-04-12 MED ORDER — FERROUS SULFATE 324 (65 FE) MG PO TBEC: 324.0000 mg | DELAYED_RELEASE_TABLET | Freq: Two times a day (BID) | ORAL | 3 refills | Status: DC

## 2021-04-12 NOTE — Telephone Encounter (Signed)
-----   Message from Richardo Priest, MD sent at 04/12/2021  2:22 PM EDT ----- She is iron deficient  Start 5 grains oral twice daily  With her heart disease I think she should see hematology I think she would benefit from IV iron and she could have marked deterioration if she became significantly anemic.  Referred to hematology [facility].

## 2021-04-12 NOTE — Telephone Encounter (Signed)
Spoke with patient regarding results and recommendation.  Patient verbalizes understanding and is agreeable to plan of care. Advised patient to call back with any issues or concerns.  

## 2021-04-15 ENCOUNTER — Telehealth: Payer: Self-pay | Admitting: Hematology

## 2021-04-15 DIAGNOSIS — M6281 Muscle weakness (generalized): Secondary | ICD-10-CM | POA: Diagnosis not present

## 2021-04-15 DIAGNOSIS — M5489 Other dorsalgia: Secondary | ICD-10-CM | POA: Diagnosis not present

## 2021-04-15 DIAGNOSIS — M2560 Stiffness of unspecified joint, not elsewhere classified: Secondary | ICD-10-CM | POA: Diagnosis not present

## 2021-04-15 DIAGNOSIS — R293 Abnormal posture: Secondary | ICD-10-CM | POA: Diagnosis not present

## 2021-04-15 DIAGNOSIS — M62830 Muscle spasm of back: Secondary | ICD-10-CM | POA: Diagnosis not present

## 2021-04-15 DIAGNOSIS — M25612 Stiffness of left shoulder, not elsewhere classified: Secondary | ICD-10-CM | POA: Diagnosis not present

## 2021-04-15 NOTE — Telephone Encounter (Signed)
Patient referred by Dr Aaron Edelman [place]/Dr [place] for Iron Def/Anemia.  Appt made for 04/30/21 Consult 11:15 am - Labs 1:00 pm

## 2021-04-15 NOTE — Telephone Encounter (Signed)
    Pt is calling back, she said she did not understand why she have anemia and why she needs to see a hematologist

## 2021-04-16 NOTE — Telephone Encounter (Signed)
She is anemic because she has severe iron deficiency  With her heart disease and I think she requires replacement and the hematologist can give IV iron.

## 2021-04-16 NOTE — Telephone Encounter (Signed)
Spoke to the patient just now and let her know Dr. Joya Gaskins comments and recommendations. She verbalizes understanding.

## 2021-04-18 DIAGNOSIS — M6281 Muscle weakness (generalized): Secondary | ICD-10-CM | POA: Diagnosis not present

## 2021-04-18 DIAGNOSIS — M25612 Stiffness of left shoulder, not elsewhere classified: Secondary | ICD-10-CM | POA: Diagnosis not present

## 2021-04-18 DIAGNOSIS — R293 Abnormal posture: Secondary | ICD-10-CM | POA: Diagnosis not present

## 2021-04-18 DIAGNOSIS — M2560 Stiffness of unspecified joint, not elsewhere classified: Secondary | ICD-10-CM | POA: Diagnosis not present

## 2021-04-18 DIAGNOSIS — M5489 Other dorsalgia: Secondary | ICD-10-CM | POA: Diagnosis not present

## 2021-04-18 DIAGNOSIS — M62830 Muscle spasm of back: Secondary | ICD-10-CM | POA: Diagnosis not present

## 2021-04-24 DIAGNOSIS — M62830 Muscle spasm of back: Secondary | ICD-10-CM | POA: Diagnosis not present

## 2021-04-24 DIAGNOSIS — M25612 Stiffness of left shoulder, not elsewhere classified: Secondary | ICD-10-CM | POA: Diagnosis not present

## 2021-04-24 DIAGNOSIS — R293 Abnormal posture: Secondary | ICD-10-CM | POA: Diagnosis not present

## 2021-04-24 DIAGNOSIS — M6281 Muscle weakness (generalized): Secondary | ICD-10-CM | POA: Diagnosis not present

## 2021-04-24 DIAGNOSIS — M2560 Stiffness of unspecified joint, not elsewhere classified: Secondary | ICD-10-CM | POA: Diagnosis not present

## 2021-04-24 DIAGNOSIS — M5489 Other dorsalgia: Secondary | ICD-10-CM | POA: Diagnosis not present

## 2021-04-29 NOTE — Progress Notes (Signed)
[facility]  [phone] [phone]  Clinic New consult Note   Patient Care Team: Street, Sharon Mt, MD as PCP - General (Family Medicine) Gaynelle Arabian, MD as Consulting Physician (Orthopedic Surgery) Richardo Priest, MD as Consulting Physician (Cardiology) 04/30/2021  CHIEF COMPLAINTS/PURPOSE OF CONSULTATION:  Iron deficiency   REFERRAL PHYSICIAN: Dr. Bettina Gavia   HISTORY OF PRESENTING ILLNESS:  Ashley Ruiz 87 y.o. female is here because of recently diagnosed iron deficiency.  She was referred by her cardiologist Dr. Bettina Gavia.  She presents to the clinic by herself.  She has mild iron deficiency in her 70's and was on oral iron for a while.  It resolved later on and she stopped oral iron.  She has TIA in 2016, no residual neurodeficits.  She was placed on Plavix by her primary care physician a year ago for prophylaxis.  She has experienced mild intermittent epistaxis since then, due to the worsening epistaxis in March 2022, she stopped Plavix and placed back on baby aspirin.  She saw her cardiologist on Apr 09, 2021, routine labs showed ferritin 17, iron saturation 7%, and elevated UIBC.  Her CBC showed hemoglobin 12, hematocrit 38.1%, MCV 72, slightly decreased.  The rest of CBC were normal.  Her CBC in the past was normal most time, did have mild anemia several years ago.  She is clinically very well, denies any pain (except mild intermittent low back pain), fatigue or dyspnea on exertion.  She lives independently, still drives, no limitation of her activities.  She denies any overt bleeding, except epistaxis, which stopped after she came off Plavix.  Last colonoscopy 8 years ago, which was clear per patient.    MEDICAL HISTORY:  Past Medical History:  Diagnosis Date  . Aortic stenosis    peak/mean pressure gradient 28/16 mmHg by echo at [facility] 12/28/15  . Arthritis    OA AND PAIN BOTH KNEES; SOME PAIN IN RT HIP  . Essential  hypertension 02/01/2016  . Heart murmur   . Hip arthritis 02/01/2016  . History of anemia   . History of gallstones   . History of total knee replacement, bilateral 05/16/2019  . Hypercholesteremia   . Hyperlipidemia 02/01/2016  . Hypertension   . PONV (postoperative nausea and vomiting)   . Skin cancer   . Stroke [facility])    ?   SMALL TIA , PATIENT HAD 1 EPISODE DOUBLE VISION 12/2014  . TIA (transient ischemic attack) 02/09/2017    SURGICAL HISTORY: Past Surgical History:  Procedure Laterality Date  . ABDOMINAL HYSTERECTOMY  ? 2005  . CHOLECYSTECTOMY  ? 1982  . COLONOSCOPY    . TONSILLECTOMY  1943  . TOTAL HIP ARTHROPLASTY Right 03/12/2016   Procedure: RIGHT TOTAL HIP ARTHROPLASTY ANTERIOR APPROACH WITH LEFT KNEE INJECTION;  Surgeon: Gaynelle Arabian, MD;  Location: [facility];  Service: Orthopedics;  Laterality: Right;  . TOTAL KNEE ARTHROPLASTY Right 01/23/2014   Procedure: RIGHT TOTAL KNEE ARTHROPLASTY;  Surgeon: [name], MD;  Location: [facility];  Service: Orthopedics;  Laterality: Right;  . TOTAL KNEE ARTHROPLASTY Left 05/02/2019   Procedure: TOTAL KNEE ARTHROPLASTY;  Surgeon: Gaynelle Arabian, MD;  Location: [facility];  Service: Orthopedics;  Laterality: Left;  33min    SOCIAL HISTORY: Social History   Socioeconomic History  . Marital status: Widowed    Spouse name: Not on file  . Number of children: Not on file  . Years of education: Not on file  . Highest education level: Not  on file  Occupational History  . Not on file  Tobacco Use  . Smoking status: Former Smoker    Packs/day: 0.25    Years: 10.00    Pack years: 2.50    Types: Cigarettes    Quit date: 04/12/1985    Years since quitting: 36.0  . Smokeless tobacco: Never Used  Vaping Use  . Vaping Use: Never used  Substance and Sexual Activity  . Alcohol use: Not Currently    Comment: RARE GLASS WINE  . Drug use: No  . Sexual activity: Not on file  Other Topics Concern  . Not on file  Social History Narrative  . Not on  file   Social Determinants of Health   Financial Resource Strain: Not on file  Food Insecurity: Not on file  Transportation Needs: Not on file  Physical Activity: Not on file  Stress: Not on file  Social Connections: Not on file  Intimate Partner Violence: Not on file    FAMILY HISTORY: Family History  Problem Relation Age of Onset  . Other Mother   . Heart attack Father   . Cancer Brother 4       unknown type cancer     ALLERGIES:  is allergic to biaxin [clarithromycin] and rifampin.  MEDICATIONS:  Current Outpatient Medications  Medication Sig Dispense Refill  . amLODipine (NORVASC) 5 MG tablet Take 1 tablet (5 mg total) by mouth daily. 30 tablet 7  . Biotin 5000 MCG TABS Take 5,000 mcg by mouth daily.    . Coenzyme Q10 (CO Q 10) 100 MG CAPS Take 100 mg by mouth daily.     . Ergocalciferol 10 MCG (400 UNIT) TABS ergocalciferol (vitamin D2) 10 mcg (400 unit) tablet   400 units by oral route.    . ferrous sulfate 324 (65 Fe) MG TBEC Take 1 tablet (324 mg total) by mouth in the morning and at bedtime. 60 tablet 3  . latanoprost (XALATAN) 0.005 % ophthalmic solution Place 1 drop into the right eye at bedtime.     Marland Kitchen losartan (COZAAR) 100 MG tablet Take 100 mg by mouth every morning.     . methocarbamol (ROBAXIN) 750 MG tablet Take 750 mg by mouth 3 (three) times daily as needed for muscle spasms.    . simvastatin (ZOCOR) 10 MG tablet Take 10 mg by mouth 2 (two) times a week.    . spironolactone-hydrochlorothiazide (ALDACTAZIDE) 25-25 MG tablet Take 1 tablet by mouth every other day.    . traMADol (ULTRAM) 50 MG tablet Take 50 mg by mouth daily as needed for severe pain.     No current facility-administered medications for this visit.    REVIEW OF SYSTEMS:   Constitutional: Denies fevers, chills or abnormal night sweats Eyes: Denies blurriness of vision, double vision or watery eyes Ears, nose, mouth, throat, and face: Denies mucositis or sore throat Respiratory: Denies  cough, dyspnea or wheezes Cardiovascular: Denies palpitation, chest discomfort or lower extremity swelling Gastrointestinal:  Denies nausea, heartburn or change in bowel habits Skin: Denies abnormal skin rashes Lymphatics: Denies new lymphadenopathy or easy bruising Neurological:Denies numbness, tingling or new weaknesses Behavioral/Psych: Mood is stable, no new changes  All other systems were reviewed with the patient and are negative.  PHYSICAL EXAMINATION: ECOG PERFORMANCE STATUS:   Vitals:   04/30/21 1100  BP: (!) 148/56  Pulse: 76  Resp: 16  Temp: 98.1 F (36.7 C)  SpO2: 96%   Filed Weights   04/30/21 1100  Weight: 163  lb 4 oz (74 kg)    GENERAL:alert, no distress and comfortable SKIN: skin color, texture, turgor are normal, no rashes or significant lesions EYES: normal, conjunctiva are pink and non-injected, sclera clear LYMPH:  no palpable lymphadenopathy in the cervical, axillary or inguinal LUNGS: clear to auscultation and percussion with normal breathing effort HEART: regular rate & rhythm and no murmurs and no lower extremity edema ABDOMEN:abdomen soft, non-tender and normal bowel sounds Musculoskeletal:no cyanosis of digits and no clubbing  PSYCH: alert & oriented x 3 with fluent speech NEURO: no focal motor/sensory deficits  LABORATORY DATA:  I have reviewed the data as listed CBC Latest Ref Rng & Units 04/09/2021 05/03/2019 04/28/2019  WBC 3.4 - 10.8 x10E3/uL 6.8 12.9(H) 8.1  Hemoglobin 11.1 - 15.9 g/dL 12.0 12.3 14.9  Hematocrit 34.0 - 46.6 % 38.1 37.5 44.3  Platelets 150 - 450 x10E3/uL 205 151 181    CMP Latest Ref Rng & Units 04/09/2021 09/30/2019 05/03/2019  Glucose 65 - 99 mg/dL 98 113(H) 113(H)  BUN 8 - 27 mg/dL 16 17 16   Creatinine 0.57 - 1.00 mg/dL 0.95 0.79 0.65  Sodium 134 - 144 mmol/L 138 139 136  Potassium 3.5 - 5.2 mmol/L 4.4 3.9 3.8  Chloride 96 - 106 mmol/L 100 101 107  CO2 20 - 29 mmol/L 23 23 22   Calcium 8.7 - 10.3 mg/dL 10.3 10.1 8.5(L)   Total Protein 6.0 - 8.5 g/dL 6.6 6.9 -  Total Bilirubin 0.0 - 1.2 mg/dL 0.3 0.2 -  Alkaline Phos 44 - 121 IU/L 88 79 -  AST 0 - 40 IU/L 18 19 -  ALT 0 - 32 IU/L 13 11 -     RADIOGRAPHIC STUDIES: I have personally reviewed the radiological images as listed and agreed with the findings in the report. No results found.  ASSESSMENT & PLAN:  87 yo female  1.  Iron deficiency without anemia -I reviewed her previous lab results, lab from February 07, 2021 or consistent with iron deficiency, no anemia, hemoglobin was 12, with mild low MCV -Her iron deficiency is likely related to epistaxis, and possible other small bleeding from Plavix, which she has came off a few months ago.  No clinical concern for malignancy, no overt GI bleeding. -She has started oral ferrous sulfate twice a day, tolerating well, will continue -She is asymptomatic, no anemia, she will unlikely need IV iron, unless she is not responding to oral iron. -Plan to repeat CBC and iron study in 2 weeks, if iron level improves, will continue 3 to 6 months of oral iron, and see [place] as needed in the future.  2. HTN, aortic stenosis -Follow-up with cardiologist Dr. Bettina Gavia   Plan -Continue oral iron.  Repeat lab on 6/20, I will call her on 6/21.  If after procedure resolves, will continue oral iron for 3 to 6 months and see [place] back as needed.   All questions were answered. The patient knows to call the clinic with any problems, questions or concerns. I spent 30 minutes counseling the patient face to face. The total time spent in the appointment was 40 minutes and more than 50% was on counseling.     Truitt Merle, MD 04/30/21 12:02 PM

## 2021-04-30 ENCOUNTER — Encounter: Payer: Self-pay | Admitting: Hematology

## 2021-04-30 ENCOUNTER — Inpatient Hospital Stay: Payer: Medicare Other

## 2021-04-30 ENCOUNTER — Other Ambulatory Visit: Payer: Self-pay

## 2021-04-30 ENCOUNTER — Inpatient Hospital Stay: Payer: Medicare Other | Attending: Hematology | Admitting: Hematology

## 2021-04-30 VITALS — BP 148/56 | HR 76 | Temp 98.1°F | Resp 16 | Ht 60.5 in | Wt 163.2 lb

## 2021-04-30 DIAGNOSIS — Z809 Family history of malignant neoplasm, unspecified: Secondary | ICD-10-CM

## 2021-04-30 DIAGNOSIS — Z7982 Long term (current) use of aspirin: Secondary | ICD-10-CM | POA: Diagnosis not present

## 2021-04-30 DIAGNOSIS — E611 Iron deficiency: Secondary | ICD-10-CM | POA: Diagnosis not present

## 2021-04-30 DIAGNOSIS — D500 Iron deficiency anemia secondary to blood loss (chronic): Secondary | ICD-10-CM

## 2021-04-30 DIAGNOSIS — I350 Nonrheumatic aortic (valve) stenosis: Secondary | ICD-10-CM | POA: Diagnosis not present

## 2021-04-30 DIAGNOSIS — Z87891 Personal history of nicotine dependence: Secondary | ICD-10-CM

## 2021-04-30 DIAGNOSIS — I10 Essential (primary) hypertension: Secondary | ICD-10-CM

## 2021-05-02 DIAGNOSIS — M5489 Other dorsalgia: Secondary | ICD-10-CM | POA: Diagnosis not present

## 2021-05-02 DIAGNOSIS — M25612 Stiffness of left shoulder, not elsewhere classified: Secondary | ICD-10-CM | POA: Diagnosis not present

## 2021-05-02 DIAGNOSIS — M62830 Muscle spasm of back: Secondary | ICD-10-CM | POA: Diagnosis not present

## 2021-05-02 DIAGNOSIS — M2560 Stiffness of unspecified joint, not elsewhere classified: Secondary | ICD-10-CM | POA: Diagnosis not present

## 2021-05-02 DIAGNOSIS — R293 Abnormal posture: Secondary | ICD-10-CM | POA: Diagnosis not present

## 2021-05-02 DIAGNOSIS — M6281 Muscle weakness (generalized): Secondary | ICD-10-CM | POA: Diagnosis not present

## 2021-05-07 DIAGNOSIS — M6281 Muscle weakness (generalized): Secondary | ICD-10-CM | POA: Diagnosis not present

## 2021-05-07 DIAGNOSIS — M2560 Stiffness of unspecified joint, not elsewhere classified: Secondary | ICD-10-CM | POA: Diagnosis not present

## 2021-05-07 DIAGNOSIS — M5489 Other dorsalgia: Secondary | ICD-10-CM | POA: Diagnosis not present

## 2021-05-07 DIAGNOSIS — M25612 Stiffness of left shoulder, not elsewhere classified: Secondary | ICD-10-CM | POA: Diagnosis not present

## 2021-05-07 DIAGNOSIS — R293 Abnormal posture: Secondary | ICD-10-CM | POA: Diagnosis not present

## 2021-05-07 DIAGNOSIS — M62830 Muscle spasm of back: Secondary | ICD-10-CM | POA: Diagnosis not present

## 2021-05-09 DIAGNOSIS — M6281 Muscle weakness (generalized): Secondary | ICD-10-CM | POA: Diagnosis not present

## 2021-05-09 DIAGNOSIS — M25612 Stiffness of left shoulder, not elsewhere classified: Secondary | ICD-10-CM | POA: Diagnosis not present

## 2021-05-09 DIAGNOSIS — M62830 Muscle spasm of back: Secondary | ICD-10-CM | POA: Diagnosis not present

## 2021-05-09 DIAGNOSIS — R293 Abnormal posture: Secondary | ICD-10-CM | POA: Diagnosis not present

## 2021-05-09 DIAGNOSIS — M5489 Other dorsalgia: Secondary | ICD-10-CM | POA: Diagnosis not present

## 2021-05-09 DIAGNOSIS — M2560 Stiffness of unspecified joint, not elsewhere classified: Secondary | ICD-10-CM | POA: Diagnosis not present

## 2021-05-10 DIAGNOSIS — S46001A Unspecified injury of muscle(s) and tendon(s) of the rotator cuff of right shoulder, initial encounter: Secondary | ICD-10-CM | POA: Diagnosis not present

## 2021-05-10 DIAGNOSIS — S93402A Sprain of unspecified ligament of left ankle, initial encounter: Secondary | ICD-10-CM | POA: Diagnosis not present

## 2021-05-10 DIAGNOSIS — Z6830 Body mass index (BMI) 30.0-30.9, adult: Secondary | ICD-10-CM | POA: Diagnosis not present

## 2021-05-20 DIAGNOSIS — M25511 Pain in right shoulder: Secondary | ICD-10-CM | POA: Diagnosis not present

## 2021-05-23 DIAGNOSIS — S42144A Nondisplaced fracture of glenoid cavity of scapula, right shoulder, initial encounter for closed fracture: Secondary | ICD-10-CM | POA: Diagnosis not present

## 2021-05-23 DIAGNOSIS — M25411 Effusion, right shoulder: Secondary | ICD-10-CM | POA: Diagnosis not present

## 2021-05-23 DIAGNOSIS — M25511 Pain in right shoulder: Secondary | ICD-10-CM | POA: Diagnosis not present

## 2021-06-05 ENCOUNTER — Telehealth: Payer: Self-pay | Admitting: Cardiology

## 2021-06-05 NOTE — Telephone Encounter (Signed)
Patient states Dr. Bettina Gavia put her on an iron pill and she has been taking it for 1.5 months. She states she recently saw her hematologist and was advised that she doesn't need an infusion. She states she assumed our office would contact her to coordinate additional lab work, but she hasn't heard from [place]. She would like to know whether or not she needs to continue on iron and if so, how long. Please advise when able.

## 2021-06-05 NOTE — Telephone Encounter (Signed)
Called patient to relay Dr. Joya Gaskins message. Verbalized understanding, no further questions at this time.

## 2021-06-10 ENCOUNTER — Telehealth: Payer: Self-pay | Admitting: Hematology

## 2021-06-10 NOTE — Telephone Encounter (Signed)
Scheduled appointment per 07/18 sch msg. Patient is aware. 

## 2021-06-18 DIAGNOSIS — S42141D Displaced fracture of glenoid cavity of scapula, right shoulder, subsequent encounter for fracture with routine healing: Secondary | ICD-10-CM | POA: Diagnosis not present

## 2021-08-23 ENCOUNTER — Telehealth: Payer: Self-pay | Admitting: Cardiology

## 2021-08-23 NOTE — Telephone Encounter (Signed)
Recommendations reviewed with pt as per Dr. Munley's note.  Pt verbalized understanding and had no additional questions.  

## 2021-08-23 NOTE — Telephone Encounter (Signed)
  Pt would like to ask Dr. Joya Gaskins recommendations, she thinking of getting the "new variant" covid shot. She wanted to know if she is ok to get it

## 2021-08-27 DIAGNOSIS — Z23 Encounter for immunization: Secondary | ICD-10-CM | POA: Diagnosis not present

## 2021-09-14 DIAGNOSIS — Z20822 Contact with and (suspected) exposure to covid-19: Secondary | ICD-10-CM | POA: Diagnosis not present

## 2021-09-14 DIAGNOSIS — J029 Acute pharyngitis, unspecified: Secondary | ICD-10-CM | POA: Diagnosis not present

## 2021-09-17 ENCOUNTER — Other Ambulatory Visit: Payer: Medicare Other

## 2021-09-18 ENCOUNTER — Telehealth: Payer: Self-pay | Admitting: Hematology

## 2021-09-18 NOTE — Telephone Encounter (Signed)
09/18/21 Patient cancelled appt.She is sick and will call back to reschedule.

## 2021-09-19 ENCOUNTER — Ambulatory Visit: Payer: Medicare Other | Admitting: Hematology

## 2021-09-20 DIAGNOSIS — J329 Chronic sinusitis, unspecified: Secondary | ICD-10-CM | POA: Diagnosis not present

## 2021-09-20 DIAGNOSIS — I10 Essential (primary) hypertension: Secondary | ICD-10-CM | POA: Diagnosis not present

## 2021-09-20 DIAGNOSIS — Z6830 Body mass index (BMI) 30.0-30.9, adult: Secondary | ICD-10-CM | POA: Diagnosis not present

## 2021-09-20 DIAGNOSIS — J40 Bronchitis, not specified as acute or chronic: Secondary | ICD-10-CM | POA: Diagnosis not present

## 2021-09-25 DIAGNOSIS — H401491 Capsular glaucoma with pseudoexfoliation of lens, unspecified eye, mild stage: Secondary | ICD-10-CM | POA: Diagnosis not present

## 2021-09-26 ENCOUNTER — Ambulatory Visit (INDEPENDENT_AMBULATORY_CARE_PROVIDER_SITE_OTHER): Payer: Medicare Other

## 2021-09-26 ENCOUNTER — Other Ambulatory Visit: Payer: Self-pay

## 2021-09-26 ENCOUNTER — Telehealth: Payer: Self-pay

## 2021-09-26 DIAGNOSIS — E782 Mixed hyperlipidemia: Secondary | ICD-10-CM | POA: Diagnosis not present

## 2021-09-26 DIAGNOSIS — I350 Nonrheumatic aortic (valve) stenosis: Secondary | ICD-10-CM | POA: Diagnosis not present

## 2021-09-26 DIAGNOSIS — I119 Hypertensive heart disease without heart failure: Secondary | ICD-10-CM

## 2021-09-26 LAB — ECHOCARDIOGRAM COMPLETE
AR max vel: 1.11 cm2
AV Area VTI: 1.2 cm2
AV Area mean vel: 1.14 cm2
AV Mean grad: 19.5 mmHg
AV Peak grad: 34.8 mmHg
Ao pk vel: 2.95 m/s
Area-P 1/2: 2.26 cm2
P 1/2 time: 450 msec
S' Lateral: 2.2 cm

## 2021-09-26 NOTE — Telephone Encounter (Signed)
-----   Message from Richardo Priest, MD sent at 09/26/2021  2:08 PM EDT ----- Doristine Devoid result no progression of her aortic valve stenosis.

## 2021-09-26 NOTE — Telephone Encounter (Signed)
Spoke with patient regarding results and recommendation.  Patient verbalizes understanding and is agreeable to plan of care. Advised patient to call back with any issues or concerns.  

## 2021-10-05 DIAGNOSIS — Z23 Encounter for immunization: Secondary | ICD-10-CM | POA: Diagnosis not present

## 2021-10-07 ENCOUNTER — Other Ambulatory Visit: Payer: Self-pay

## 2021-10-07 ENCOUNTER — Ambulatory Visit (INDEPENDENT_AMBULATORY_CARE_PROVIDER_SITE_OTHER): Payer: Medicare Other | Admitting: Cardiology

## 2021-10-07 ENCOUNTER — Encounter: Payer: Self-pay | Admitting: Cardiology

## 2021-10-07 VITALS — BP 130/55 | HR 64 | Ht 60.05 in | Wt 165.0 lb

## 2021-10-07 DIAGNOSIS — I350 Nonrheumatic aortic (valve) stenosis: Secondary | ICD-10-CM | POA: Diagnosis not present

## 2021-10-07 DIAGNOSIS — I119 Hypertensive heart disease without heart failure: Secondary | ICD-10-CM

## 2021-10-07 DIAGNOSIS — Z23 Encounter for immunization: Secondary | ICD-10-CM

## 2021-10-07 DIAGNOSIS — E611 Iron deficiency: Secondary | ICD-10-CM

## 2021-10-07 DIAGNOSIS — E782 Mixed hyperlipidemia: Secondary | ICD-10-CM | POA: Diagnosis not present

## 2021-10-07 NOTE — Patient Instructions (Signed)
Medication Instructions:  Your physician recommends that you continue on your current medications as directed. Please refer to the Current Medication list given to you today.  *If you need a refill on your cardiac medications before your next appointment, please call your pharmacy*   Lab Work: Your physician recommends that you return for lab work in: [place], CMP, Lipids If you have labs (blood work) drawn today and your tests are completely normal, you will receive your results only by: [place] (if you have MyChart) OR A paper copy in the mail If you have any lab test that is abnormal or we need to change your treatment, we will call you to review the results.   Testing/Procedures: Your physician has requested that you have an echocardiogram IN 9 MONTHS. Echocardiography is a painless test that uses sound waves to create images of your heart. It provides your doctor with information about the size and shape of your heart and how well your heart's chambers and valves are working. This procedure takes approximately one hour. There are no restrictions for this procedure.    Follow-Up: At [facility], you and your health needs are our priority.  As part of our continuing mission to provide you with exceptional heart care, we have created designated Provider Care Teams.  These Care Teams include your primary Cardiologist (physician) and Advanced Practice Providers (APPs -  Physician Assistants and Nurse Practitioners) who all work together to provide you with the care you need, when you need it.  We recommend signing up for the patient portal called "MyChart".  Sign up information is provided on this After Visit Summary.  MyChart is used to connect with patients for Virtual Visits (Telemedicine).  Patients are able to view lab/test results, encounter notes, upcoming appointments, etc.  Non-urgent messages can be sent to your provider as well.   To learn more about what you can do  with MyChart, go to [email or web address].    Your next appointment:   9 month(s)  The format for your next appointment:   In Person  Provider:   Shirlee More, MD    Other Instructions

## 2021-10-07 NOTE — Progress Notes (Signed)
Cardiology Office Note:    Date:  45/[id number]   ID:  Ashley Ruiz, DOB [birth year], MRN [id number]  PCP:  Street, Sharon Mt, MD  Cardiologist:  Shirlee More, MD    Referring MD: Street, Sharon Mt, *    ASSESSMENT:    1. Moderate aortic stenosis   2. Hypertensive heart disease without heart failure   3. Mixed hyperlipidemia   4. Iron deficiency    PLAN:    In order of problems listed above:  She has done remarkably well with her aortic stenosis no significant progression over the last 5 years and I think we can wait 9 months to recheck an echocardiogram and continue her antihypertensives statin and I would not restart anticoagulant or antiplatelet therapy at this time Stable BP at target continue current antihypertensive medications Continue statin we will check labs including lipids renal function potassium and liver function Recheck CBC.  She will continue oral iron   Next appointment: 9 months   Medication Adjustments/Labs and Tests Ordered: Current medicines are reviewed at length with the patient today.  Concerns regarding medicines are outlined above.  No orders of the defined types were placed in this encounter.  No orders of the defined types were placed in this encounter.   Chief Complaint  Patient presents with   Follow-up   Aortic Stenosis    History of Present Illness:    Ashley Ruiz is a 87 y.o. female with a hx of aortic stenosis hypertension and hyperlipidemia last seen 04/08/2021 . Compliance with diet, lifestyle and medications: Yes  She is recovered from respiratory infection she was to have labs done but they were not performed we will do them today She tolerates her oral iron without GI upset she has had no angina syncope or shortness of breath or valvular heart disease Overall quite pleased with the quality of her life lives independently. She has had no further epistasis that previously caused iron deficiency.  Respiratory  symptms and a COVID-19 test performed at urgent care [facility] 09/15/2021 that was negative.  Most recent cardiac echo 09/26/2021 she has aortic valve mildly calcified thickening mild to moderate aortic stenosis with mean gradient of 20 mmHg and mild aortic regurgitation.  She had normal stroke-volume index and aortic valve area VTI was 1.2 sonometer squared  1. Left ventricular ejection fraction, by estimation, is 60 to 65%. The  left ventricle has normal function. The left ventricle has no regional  wall motion abnormalities. Left ventricular diastolic parameters are  consistent with Grade I diastolic  dysfunction (impaired relaxation).   2. Right ventricular systolic function is normal. The right ventricular  size is normal.   3. The mitral valve is normal in structure. Mild mitral valve  regurgitation. No evidence of mitral stenosis.   4. The aortic valve is normal in structure. There is mild calcification  of the aortic valve. There is mild thickening of the aortic valve. Aortic  valve regurgitation is mild. Mild to moderate aortic valve stenosis.  Aortic valve mean gradient measures 19.5 mmHg.   5. The inferior vena cava is normal in size with greater than 50%  respiratory variability, suggesting right atrial pressure of 3 mmHg. Past Medical History:  Diagnosis Date   Aortic stenosis    peak/mean pressure gradient 28/16 mmHg by echo at [facility] 12/28/15   Arthritis    OA AND PAIN BOTH KNEES; SOME PAIN IN RT HIP   Essential hypertension 02/01/2016   Heart murmur  Hip arthritis 02/01/2016   History of anemia    History of gallstones    History of total knee replacement, bilateral 05/16/2019   Hypercholesteremia    Hyperlipidemia 02/01/2016   Hypertension    PONV (postoperative nausea and vomiting)    Skin cancer    Stroke [facility])    ?   SMALL TIA , PATIENT HAD 1 EPISODE DOUBLE VISION 12/2014   TIA (transient ischemic attack) 02/09/2017    Past Surgical History:   Procedure Laterality Date   ABDOMINAL HYSTERECTOMY  ? 2005   CHOLECYSTECTOMY  ? [place]   TOTAL HIP ARTHROPLASTY Right 03/12/2016   Procedure: RIGHT TOTAL HIP ARTHROPLASTY ANTERIOR APPROACH WITH LEFT KNEE INJECTION;  Surgeon: Gaynelle Arabian, MD;  Location: [facility];  Service: Orthopedics;  Laterality: Right;   TOTAL KNEE ARTHROPLASTY Right 01/23/2014   Procedure: RIGHT TOTAL KNEE ARTHROPLASTY;  Surgeon: [name], MD;  Location: [facility];  Service: Orthopedics;  Laterality: Right;   TOTAL KNEE ARTHROPLASTY Left 05/02/2019   Procedure: TOTAL KNEE ARTHROPLASTY;  Surgeon: Gaynelle Arabian, MD;  Location: [facility];  Service: Orthopedics;  Laterality: Left;  20min    Current Medications: Current Meds  Medication Sig   amLODipine (NORVASC) 5 MG tablet Take 1 tablet (5 mg total) by mouth daily.   Biotin 5000 MCG TABS Take 5,000 mcg by mouth daily.   Coenzyme Q10 (CO Q 10) 100 MG CAPS Take 100 mg by mouth daily.    Ergocalciferol 10 MCG (400 UNIT) TABS ergocalciferol (vitamin D2) 10 mcg (400 unit) tablet   400 units by oral route.   ferrous sulfate 324 (65 Fe) MG TBEC Take 1 tablet (324 mg total) by mouth in the morning and at bedtime.   losartan (COZAAR) 100 MG tablet Take 100 mg by mouth every morning.    simvastatin (ZOCOR) 10 MG tablet Take 10 mg by mouth 2 (two) times a week.   spironolactone-hydrochlorothiazide (ALDACTAZIDE) 25-25 MG tablet Take 1 tablet by mouth every other day.     Allergies:   Biaxin [clarithromycin] and Rifampin   Social History   Socioeconomic History   Marital status: Widowed    Spouse name: Not on file   Number of children: Not on file   Years of education: Not on file   Highest education level: Not on file  Occupational History   Not on file  Tobacco Use   Smoking status: Former    Packs/day: 0.25    Years: 10.00    Pack years: 2.50    Types: Cigarettes    Quit date: 04/12/1985    Years since quitting: 36.5   Smokeless  tobacco: Never  Vaping Use   Vaping Use: Never used  Substance and Sexual Activity   Alcohol use: Not Currently    Comment: RARE GLASS WINE   Drug use: No   Sexual activity: Not on file  Other Topics Concern   Not on file  Social History Narrative   Not on file   Social Determinants of Health   Financial Resource Strain: Not on file  Food Insecurity: Not on file  Transportation Needs: Not on file  Physical Activity: Not on file  Stress: Not on file  Social Connections: Not on file     Family History: The patient's family history includes Cancer (age of onset: 26) in her brother; Heart attack in her father; Other in her mother. ROS:   Please see the history  of present illness.    All other systems reviewed and are negative.  EKGs/Labs/Other Studies Reviewed:    The following studies were reviewed today:  EKG:  EKG ordered today and personally reviewed.  The ekg ordered today demonstrates sinus rhythm with occasional PVCs otherwise normal EKG  Recent Labs: 04/09/2021: ALT 13; BUN 16; Creatinine, Ser 0.95; Hemoglobin 12.0; Magnesium 1.9; Platelets 205; Potassium 4.4; Sodium 138  Recent Lipid Panel    Component Value Date/Time   CHOL 165 04/09/2021 1641   TRIG 110 04/09/2021 1641   HDL 57 04/09/2021 1641   CHOLHDL 2.9 04/09/2021 1641   LDLCALC 88 04/09/2021 1641    Physical Exam:    VS:  BP (!) 130/55   Pulse 64   Ht 5' 0.05" (1.525 m)   Wt 165 lb (74.8 kg)   SpO2 97%   BMI 32.17 kg/m     Wt Readings from Last 3 Encounters:  10/07/21 165 lb (74.8 kg)  04/30/21 163 lb 4 oz (74 kg)  04/09/21 163 lb 12.8 oz (74.3 kg)     GEN: She looks younger than her age well nourished, well developed in no acute distress HEENT: Normal NECK: No JVD; No carotid bruits LYMPHATICS: No lymphadenopathy CARDIAC: 2 of 6 AAS midsystolic no AR does not encompass S2 does not radiate to the carotids RRR, no  rubs, gallops RESPIRATORY:  Clear to auscultation without rales, wheezing  or rhonchi  ABDOMEN: Soft, non-tender, non-distended MUSCULOSKELETAL:  No edema; No deformity  SKIN: Warm and dry NEUROLOGIC:  Alert and oriented x 3 PSYCHIATRIC:  Normal affect    Signed, Shirlee More, MD  10/07/2021 11:01 AM    [place]

## 2021-10-08 ENCOUNTER — Telehealth: Payer: Self-pay

## 2021-10-08 LAB — CBC
Hematocrit: 44.8 % (ref 34.0–46.6)
Hemoglobin: 14.7 g/dL (ref 11.1–15.9)
MCH: 27.3 pg (ref 26.6–33.0)
MCHC: 32.8 g/dL (ref 31.5–35.7)
MCV: 83 fL (ref 79–97)
Platelets: 197 10*3/uL (ref 150–450)
RBC: 5.38 x10E6/uL — ABNORMAL HIGH (ref 3.77–5.28)
RDW: 12.5 % (ref 11.7–15.4)
WBC: 7.7 10*3/uL (ref 3.4–10.8)

## 2021-10-08 LAB — LIPID PANEL
Chol/HDL Ratio: 3.9 ratio (ref 0.0–4.4)
Cholesterol, Total: 190 mg/dL (ref 100–199)
HDL: 49 mg/dL (ref >39)
LDL Chol Calc (NIH): 112 mg/dL — ABNORMAL HIGH (ref 0–99)
Triglycerides: 165 mg/dL — ABNORMAL HIGH (ref 0–149)
VLDL Cholesterol Cal: 29 mg/dL (ref 5–40)

## 2021-10-08 LAB — COMPREHENSIVE METABOLIC PANEL
ALT: 15 IU/L (ref 0–32)
AST: 21 IU/L (ref 0–40)
Albumin/Globulin Ratio: 1.7 (ref 1.2–2.2)
Albumin: 4.3 g/dL (ref 3.5–4.6)
Alkaline Phosphatase: 79 IU/L (ref 44–121)
BUN/Creatinine Ratio: 21 (ref 12–28)
BUN: 17 mg/dL (ref 10–36)
Bilirubin Total: 0.4 mg/dL (ref 0.0–1.2)
CO2: 23 mmol/L (ref 20–29)
Calcium: 9.9 mg/dL (ref 8.7–10.3)
Chloride: 100 mmol/L (ref 96–106)
Creatinine, Ser: 0.81 mg/dL (ref 0.57–1.00)
Globulin, Total: 2.6 g/dL (ref 1.5–4.5)
Glucose: 94 mg/dL (ref 70–99)
Potassium: 4.2 mmol/L (ref 3.5–5.2)
Sodium: 137 mmol/L (ref 134–144)
Total Protein: 6.9 g/dL (ref 6.0–8.5)
eGFR: 69 mL/min/{1.73_m2} (ref >59)

## 2021-10-08 NOTE — Telephone Encounter (Signed)
Spoke with patient regarding results and recommendation.  Patient verbalizes understanding and is agreeable to plan of care. Advised patient to call back with any issues or concerns.  

## 2021-10-08 NOTE — Telephone Encounter (Signed)
-----   Message from Richardo Priest, MD sent at 10/08/2021  9:43 AM EST ----- Normal or stable result

## 2021-12-05 DIAGNOSIS — J9811 Atelectasis: Secondary | ICD-10-CM | POA: Diagnosis not present

## 2021-12-05 DIAGNOSIS — R051 Acute cough: Secondary | ICD-10-CM | POA: Diagnosis not present

## 2021-12-05 DIAGNOSIS — Z20828 Contact with and (suspected) exposure to other viral communicable diseases: Secondary | ICD-10-CM | POA: Diagnosis not present

## 2021-12-05 DIAGNOSIS — R0981 Nasal congestion: Secondary | ICD-10-CM | POA: Diagnosis not present

## 2021-12-09 DIAGNOSIS — I10 Essential (primary) hypertension: Secondary | ICD-10-CM | POA: Diagnosis not present

## 2021-12-09 DIAGNOSIS — Z0000 Encounter for general adult medical examination without abnormal findings: Secondary | ICD-10-CM | POA: Diagnosis not present

## 2021-12-09 DIAGNOSIS — I672 Cerebral atherosclerosis: Secondary | ICD-10-CM | POA: Diagnosis not present

## 2021-12-09 DIAGNOSIS — R7302 Impaired glucose tolerance (oral): Secondary | ICD-10-CM | POA: Diagnosis not present

## 2021-12-09 DIAGNOSIS — Z79899 Other long term (current) drug therapy: Secondary | ICD-10-CM | POA: Diagnosis not present

## 2021-12-09 DIAGNOSIS — M1991 Primary osteoarthritis, unspecified site: Secondary | ICD-10-CM | POA: Diagnosis not present

## 2021-12-09 DIAGNOSIS — E785 Hyperlipidemia, unspecified: Secondary | ICD-10-CM | POA: Diagnosis not present

## 2022-01-07 DIAGNOSIS — J40 Bronchitis, not specified as acute or chronic: Secondary | ICD-10-CM | POA: Diagnosis not present

## 2022-01-07 DIAGNOSIS — Z6830 Body mass index (BMI) 30.0-30.9, adult: Secondary | ICD-10-CM | POA: Diagnosis not present

## 2022-01-07 DIAGNOSIS — I080 Rheumatic disorders of both mitral and aortic valves: Secondary | ICD-10-CM | POA: Diagnosis not present

## 2022-01-07 DIAGNOSIS — J329 Chronic sinusitis, unspecified: Secondary | ICD-10-CM | POA: Diagnosis not present

## 2022-01-29 DIAGNOSIS — H25811 Combined forms of age-related cataract, right eye: Secondary | ICD-10-CM | POA: Diagnosis not present

## 2022-01-29 DIAGNOSIS — H401491 Capsular glaucoma with pseudoexfoliation of lens, unspecified eye, mild stage: Secondary | ICD-10-CM | POA: Diagnosis not present

## 2022-01-29 DIAGNOSIS — Z961 Presence of intraocular lens: Secondary | ICD-10-CM | POA: Diagnosis not present

## 2022-04-20 DIAGNOSIS — L03116 Cellulitis of left lower limb: Secondary | ICD-10-CM | POA: Diagnosis not present

## 2022-04-23 DIAGNOSIS — Z6830 Body mass index (BMI) 30.0-30.9, adult: Secondary | ICD-10-CM | POA: Diagnosis not present

## 2022-04-23 DIAGNOSIS — T148XXA Other injury of unspecified body region, initial encounter: Secondary | ICD-10-CM | POA: Diagnosis not present

## 2022-04-23 DIAGNOSIS — Z23 Encounter for immunization: Secondary | ICD-10-CM | POA: Diagnosis not present

## 2022-04-23 DIAGNOSIS — L089 Local infection of the skin and subcutaneous tissue, unspecified: Secondary | ICD-10-CM | POA: Diagnosis not present

## 2022-05-06 DIAGNOSIS — I10 Essential (primary) hypertension: Secondary | ICD-10-CM | POA: Diagnosis not present

## 2022-05-06 DIAGNOSIS — E663 Overweight: Secondary | ICD-10-CM | POA: Diagnosis not present

## 2022-05-06 DIAGNOSIS — S91002D Unspecified open wound, left ankle, subsequent encounter: Secondary | ICD-10-CM | POA: Diagnosis not present

## 2022-05-06 DIAGNOSIS — M25472 Effusion, left ankle: Secondary | ICD-10-CM | POA: Diagnosis not present

## 2022-05-16 ENCOUNTER — Telehealth: Payer: Self-pay | Admitting: Cardiology

## 2022-05-16 ENCOUNTER — Telehealth: Payer: Self-pay

## 2022-05-16 NOTE — Telephone Encounter (Signed)
Notified pt of Dr. Hulen Shouts reply. She stated that she di not have any scales. Due to pt not being able to purchase scales to be compliant with the medical plan, scales were provided for pt. Pt agreed with plan and verbalized understanding. She had no further questions.

## 2022-06-05 ENCOUNTER — Other Ambulatory Visit: Payer: Self-pay

## 2022-06-05 ENCOUNTER — Telehealth: Payer: Self-pay

## 2022-06-05 NOTE — Telephone Encounter (Signed)
Called patient and left a detailed message on her voice mail (per DPR) informing her of Dr. Joya Gaskins response below:  "I reviewed her blood pressures they are in good range no change in treatment I think she is taking her medications the right way and does not need to change them taking amlodipine in the evening and losartan in the morning."  Asked the patient if she has any questions to contact the office.

## 2022-06-05 NOTE — Telephone Encounter (Signed)
-----   Message from Richardo Priest, MD sent at 06/01/2022  9:31 AM EDT ----- I reviewed her blood pressures they are in good range no change in treatment I think she is taking her medications the right way and does not need to change them taking amlodipine in the evening and losartan in the morning.

## 2022-06-11 DIAGNOSIS — L820 Inflamed seborrheic keratosis: Secondary | ICD-10-CM | POA: Diagnosis not present

## 2022-06-17 ENCOUNTER — Telehealth: Payer: Self-pay

## 2022-06-17 ENCOUNTER — Ambulatory Visit (INDEPENDENT_AMBULATORY_CARE_PROVIDER_SITE_OTHER): Payer: Medicare Other

## 2022-06-17 DIAGNOSIS — E782 Mixed hyperlipidemia: Secondary | ICD-10-CM | POA: Diagnosis not present

## 2022-06-17 DIAGNOSIS — I119 Hypertensive heart disease without heart failure: Secondary | ICD-10-CM | POA: Diagnosis not present

## 2022-06-17 DIAGNOSIS — E611 Iron deficiency: Secondary | ICD-10-CM

## 2022-06-17 DIAGNOSIS — I350 Nonrheumatic aortic (valve) stenosis: Secondary | ICD-10-CM

## 2022-06-17 LAB — ECHOCARDIOGRAM COMPLETE
AR max vel: 1.08 cm2
AV Area VTI: 0.93 cm2
AV Area mean vel: 1.03 cm2
AV Mean grad: 13.7 mmHg
AV Peak grad: 22 mmHg
Ao pk vel: 2.35 m/s
Area-P 1/2: 2.32 cm2
P 1/2 time: 645 msec
S' Lateral: 2 cm

## 2022-06-17 NOTE — Telephone Encounter (Signed)
Results reviewed with pt as per Dr. Munley's note.  Pt verbalized understanding and had no additional questions. Routed to PCP  

## 2022-07-04 ENCOUNTER — Ambulatory Visit (INDEPENDENT_AMBULATORY_CARE_PROVIDER_SITE_OTHER): Payer: Medicare Other | Admitting: Cardiology

## 2022-07-04 VITALS — BP 130/69 | Ht 60.05 in | Wt 168.4 lb

## 2022-07-04 DIAGNOSIS — E782 Mixed hyperlipidemia: Secondary | ICD-10-CM | POA: Diagnosis not present

## 2022-07-04 DIAGNOSIS — I119 Hypertensive heart disease without heart failure: Secondary | ICD-10-CM | POA: Diagnosis not present

## 2022-07-04 DIAGNOSIS — I350 Nonrheumatic aortic (valve) stenosis: Secondary | ICD-10-CM

## 2022-07-04 NOTE — Patient Instructions (Signed)
Medication Instructions:  Your physician recommends that you continue on your current medications as directed. Please refer to the Current Medication list given to you today.  *If you need a refill on your cardiac medications before your next appointment, please call your pharmacy*   Lab Work: Your physician recommends that you return for lab work in:   Labs today: CMP, Lipids  If you have labs (blood work) drawn today and your tests are completely normal, you will receive your results only by: [place] (if you have [place]) OR A paper copy in the mail If you have any lab test that is abnormal or we need to change your treatment, we will call you to review the results.   Testing/Procedures: None   Follow-Up: At [facility], you and your health needs are our priority.  As part of our continuing mission to provide you with exceptional heart care, we have created designated Provider Care Teams.  These Care Teams include your primary Cardiologist (physician) and Advanced Practice Providers (APPs -  Physician Assistants and Nurse Practitioners) who all work together to provide you with the care you need, when you need it.  We recommend signing up for the patient portal called "MyChart".  Sign up information is provided on this After Visit Summary.  MyChart is used to connect with patients for Virtual Visits (Telemedicine).  Patients are able to view lab/test results, encounter notes, upcoming appointments, etc.  Non-urgent messages can be sent to your provider as well.   To learn more about what you can do with MyChart, go to [email or web address].    Your next appointment:   6 month(s)  The format for your next appointment:   In Person  Provider:   Shirlee More, MD    Other Instructions None  Important Information About Sugar

## 2022-07-04 NOTE — Progress Notes (Signed)
Cardiology Office Note:    Date:  1/44/8185   ID:  Ashley Ruiz, DOB [birth year], MRN [id number]  PCP:  Street, Sharon Mt, MD  Cardiologist:  Shirlee More, MD    Referring MD: Street, Sharon Mt, *    ASSESSMENT:    1. Moderate aortic stenosis   2. Mixed hyperlipidemia   3. Hypertensive heart disease without heart failure    PLAN:    In order of problems listed above:  She continues to do well with aortic stenosis has not had severe progression asymptomatic normal exercise tolerance. Continue lipid-lowering therapy I will check her lipid profile today Stable BP at target continue current treatment ARB calcium channel blocker   Next appointment: 6 months   Medication Adjustments/Labs and Tests Ordered: Current medicines are reviewed at length with the patient today.  Concerns regarding medicines are outlined above.  Orders Placed This Encounter  Procedures   Comp Met (CMET)   Lipid Profile   No orders of the defined types were placed in this encounter.  Chief complaint follow-up aortic stenosis   History of Present Illness:    Ashley Ruiz is a 88 y.o. female with a hx of asymptomatic moderate aortic stenosis hypertension and hyper lipidemia last seen 10/07/2021.  Her most recent echocardiogram 06/17/2022 shows mild to moderate aortic stenosis mean gradient 14 mmHg mild LVH normal left ventricular ejection fraction.  She had a reduced stroke-volume index VTI ratio 0.37 below   1. Left ventricular ejection fraction, by estimation, is 60 to 65%. The  left ventricle has normal function. The left ventricle has no regional  wall motion abnormalities. There is mild left ventricular hypertrophy.  Left ventricular diastolic parameters  are consistent with Grade I diastolic dysfunction (impaired relaxation).   2. Right ventricular systolic function is normal. The right ventricular  size is normal. There is mildly elevated pulmonary artery systolic  pressure.    3. The mitral valve is normal in structure. No evidence of mitral valve  regurgitation. No evidence of mitral stenosis. Moderate mitral annular  calcification.   4. The aortic valve is normal in structure. There is moderate  calcification of the aortic valve. There is mild thickening of the aortic  valve. Aortic valve regurgitation is mild. Mild to moderate aortic valve  stenosis. Aortic valve area, by VTI measures   0.93 cm. Aortic valve mean gradient measures 13.7 mmHg.   5. The inferior vena cava is normal in size with greater than 50%  respiratory variability, suggesting right atrial pressure of 3 mmHg. Compliance with diet, lifestyle and medications: Yes  Amora continues to do very well she normally goes to the gym for resistance training but she swims in the pool at her home daily She enjoys life she is having no edema shortness of breath or chest pain no mucosal bleeding with her aortic stenosis Past Medical History:  Diagnosis Date   Aortic stenosis    peak/mean pressure gradient 28/16 mmHg by echo at [facility] 12/28/15   Arthritis    OA AND PAIN BOTH KNEES; SOME PAIN IN RT HIP   Essential hypertension 02/01/2016   Heart murmur    Hip arthritis 02/01/2016   History of anemia    History of gallstones    History of total knee replacement, 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  SMALL TIA , PATIENT HAD 1 EPISODE DOUBLE VISION 12/2014   TIA (transient ischemic attack) 02/09/2017    Past Surgical History:  Procedure Laterality Date   ABDOMINAL HYSTERECTOMY  ? 2005   CHOLECYSTECTOMY  ? [place]   TOTAL HIP ARTHROPLASTY Right 03/12/2016   Procedure: RIGHT TOTAL HIP ARTHROPLASTY ANTERIOR APPROACH WITH LEFT KNEE INJECTION;  Surgeon: Gaynelle Arabian, MD;  Location: [facility];  Service: Orthopedics;  Laterality: Right;    TOTAL KNEE ARTHROPLASTY Right 01/23/2014   Procedure: RIGHT TOTAL KNEE ARTHROPLASTY;  Surgeon: [name], MD;  Location: [facility];  Service: Orthopedics;  Laterality: Right;   TOTAL KNEE ARTHROPLASTY Left 05/02/2019   Procedure: TOTAL KNEE ARTHROPLASTY;  Surgeon: Gaynelle Arabian, MD;  Location: [facility];  Service: Orthopedics;  Laterality: Left;  77mn    Current Medications: Current Meds  Medication Sig   amLODipine (NORVASC) 5 MG tablet Take 1 tablet (5 mg total) by mouth daily.   Biotin 5000 MCG TABS Take 5,000 mcg by mouth daily.   Coenzyme Q10 (CO Q 10) 100 MG CAPS Take 100 mg by mouth daily.    Ergocalciferol 10 MCG (400 UNIT) TABS ergocalciferol (vitamin D2) 10 mcg (400 unit) tablet   400 units by oral route.   ferrous sulfate 324 (65 Fe) MG TBEC Take 1 tablet (324 mg total) by mouth in the morning and at bedtime.   losartan (COZAAR) 100 MG tablet Take 100 mg by mouth every morning.    simvastatin (ZOCOR) 10 MG tablet Take 10 mg by mouth 2 (two) times a week.   spironolactone-hydrochlorothiazide (ALDACTAZIDE) 25-25 MG tablet Take 1 tablet by mouth every other day.     Allergies:   Biaxin [clarithromycin] and Rifampin   Social History   Socioeconomic History   Marital status: Widowed    Spouse name: Not on file   Number of children: Not on file   Years of education: Not on file   Highest education level: Not on file  Occupational History   Not on file  Tobacco Use   Smoking status: Former    Packs/day: 0.25    Years: 10.00    Total pack years: 2.50    Types: Cigarettes    Quit date: 04/12/1985    Years since quitting: 37.2   Smokeless tobacco: Never  Vaping Use   Vaping Use: Never used  Substance and Sexual Activity   Alcohol use: Not Currently    Comment: RARE GLASS WINE   Drug use: No   Sexual activity: Not on file  Other Topics Concern   Not on file  Social History Narrative   Not on file   Social Determinants of Health   Financial Resource Strain: Not on  file  Food Insecurity: Not on file  Transportation Needs: Not on file  Physical Activity: Not on file  Stress: Not on file  Social Connections: Not on file     Family History: The patient's family history includes Cancer (age of onset: 754 in her brother; Heart attack in her father; Other in her mother. ROS:   Please see the history of present illness.    All other systems reviewed and are negative.  EKGs/Labs/Other Studies Reviewed:    The following studies were reviewed today:    Recent Labs: 10/07/2021: ALT 15; BUN 17; Creatinine, Ser 0.81; Hemoglobin 14.7; Platelets 197; Potassium 4.2; Sodium 137  Recent Lipid Panel    Component Value Date/Time  CHOL 190 10/07/2021 1121   TRIG 165 (H) 10/07/2021 1121   HDL 49 10/07/2021 1121   CHOLHDL 3.9 10/07/2021 1121   LDLCALC 112 (H) 10/07/2021 1121    Physical Exam:    VS:  BP 130/69 (BP Location: Left Arm, Patient Position: Sitting, Cuff Size: Normal)   Ht 5' 0.05" (1.525 m)   Wt 168 lb 6.4 oz (76.4 kg)   BMI 32.83 kg/m     Wt Readings from Last 3 Encounters:  07/04/22 168 lb 6.4 oz (76.4 kg)  10/07/21 165 lb (74.8 kg)  04/30/21 163 lb 4 oz (74 kg)     GEN:  Well nourished, well developed in no acute distress HEENT: Normal NECK: No JVD; No carotid bruits LYMPHATICS: No lymphadenopathy CARDIAC: 1/6 to 2/6 aortic stenosis murmur localized aortic area does not encompass S2 does not radiate to her carotids RRR, no murmurs, rubs, gallops RESPIRATORY:  Clear to auscultation without rales, wheezing or rhonchi  ABDOMEN: Soft, non-tender, non-distended MUSCULOSKELETAL:  No edema; No deformity  SKIN: Warm and dry NEUROLOGIC:  Alert and oriented x 3 PSYCHIATRIC:  Normal affect    Signed, Shirlee More, MD  07/04/2022 11:51 AM    [place]

## 2022-09-04 DIAGNOSIS — Z6830 Body mass index (BMI) 30.0-30.9, adult: Secondary | ICD-10-CM | POA: Diagnosis not present

## 2022-09-04 DIAGNOSIS — Z23 Encounter for immunization: Secondary | ICD-10-CM | POA: Diagnosis not present

## 2022-09-04 DIAGNOSIS — F430 Acute stress reaction: Secondary | ICD-10-CM | POA: Diagnosis not present

## 2022-09-05 DIAGNOSIS — H25811 Combined forms of age-related cataract, right eye: Secondary | ICD-10-CM | POA: Diagnosis not present

## 2022-09-05 DIAGNOSIS — H268 Other specified cataract: Secondary | ICD-10-CM | POA: Diagnosis not present

## 2022-09-05 DIAGNOSIS — H40053 Ocular hypertension, bilateral: Secondary | ICD-10-CM | POA: Diagnosis not present

## 2022-09-09 ENCOUNTER — Telehealth: Payer: Self-pay | Admitting: Cardiology

## 2022-09-09 DIAGNOSIS — Z23 Encounter for immunization: Secondary | ICD-10-CM | POA: Diagnosis not present

## 2022-09-09 NOTE — Telephone Encounter (Signed)
Pt c/o medication issue:  1. Name of Medication: RDS Respiratory Shot   2. How are you currently taking this medication (dosage and times per day)?   3. Are you having a reaction (difficulty breathing--STAT)?   4. What is your medication issue? Pt is requesting call back to get advice on if this shot would be okay to take.

## 2022-09-09 NOTE — Telephone Encounter (Signed)
Called pt back with Dr. Joya Gaskins reply regarding the RSV vaccine. Pt agreed and verbalized understanding. She had no further questions.

## 2022-12-30 DIAGNOSIS — C4490 Unspecified malignant neoplasm of skin, unspecified: Secondary | ICD-10-CM | POA: Insufficient documentation

## 2022-12-31 NOTE — Progress Notes (Unsigned)
Cardiology Office Note:    Date:  08/01/9891   ID:  Ashley Ruiz, DOB [birth year], MRN [id number]  PCP:  Street, Sharon Mt, MD  Cardiologist:  Shirlee More, MD    Referring MD: [address], Sharon Mt, *    ASSESSMENT:    1. Nonrheumatic aortic valve stenosis   2. Hypertensive heart disease without heart failure   3. Mixed hyperlipidemia    PLAN:    In order of problems listed above:  She continues to do well with aortic stenosis hemodynamically mild to moderate at 1-1/2 years we will recheck her echocardiogram and see me in the office after that in 6 months.  Advised her to continue her active lifestyle without restriction Stable continue current antihypertensive diuretic combination and losartan along with amlodipine Continue her statin and she is due for labs next week with her PCP   Next appointment: 6 months after the echocardiogram is performed   Medication Adjustments/Labs and Tests Ordered: Current medicines are reviewed at length with the patient today.  Concerns regarding medicines are outlined above.  No orders of the defined types were placed in this encounter.  No orders of the defined types were placed in this encounter.   Chief Complaint  Patient presents with   Follow-up   Atrial Flutter    History of Present Illness:    Ashley Ruiz is a 89 y.o. female with a hx of asymptomatic moderate aortic stenosis hypertension and hyperlipidemia for echocardiogram 06/17/2022 showed mild to moderate aortic stenosis mean gradient 14 mmHg and a VTI ratio 0.37.  She was last seen 07/04/2022.  She continues to enjoy her life she is very active exercises on a regular basis and looks forward to reopening her swimming pool She has had no fever or chills He has had no edema shortness of breath chest pain or syncope Next week she will have her lipids and labs checked with her PCP   Compliance with diet, lifestyle and medications: Yes Past Medical History:   Diagnosis Date   Aortic stenosis    peak/mean pressure gradient 28/16 mmHg by echo at [facility] 12/28/15   Arthritis    OA AND PAIN BOTH KNEES; SOME PAIN IN RT HIP   Essential hypertension 02/01/2016   Heart murmur    Hip arthritis 02/01/2016   History of anemia    History of gallstones    History of total knee replacement, bilateral 05/16/2019   Hypercholesteremia    Hyperlipidemia 02/01/2016   Hypertension    PONV (postoperative nausea and vomiting)    Skin cancer    Stroke [facility])    ?   SMALL TIA , PATIENT HAD 1 EPISODE DOUBLE VISION 12/2014   TIA (transient ischemic attack) 02/09/2017    Past Surgical History:  Procedure Laterality Date   ABDOMINAL HYSTERECTOMY  ? 2005   CHOLECYSTECTOMY  ? [place]   TOTAL HIP ARTHROPLASTY Right 03/12/2016   Procedure: RIGHT TOTAL HIP ARTHROPLASTY ANTERIOR APPROACH WITH LEFT KNEE INJECTION;  Surgeon: Gaynelle Arabian, MD;  [facility];  Service: Orthopedics;  Laterality: Right;   TOTAL KNEE ARTHROPLASTY Right 01/23/2014   Procedure: RIGHT TOTAL KNEE ARTHROPLASTY;  Surgeon: [name], MD;  Location: [facility];  Service: Orthopedics;  Laterality: Right;   TOTAL KNEE ARTHROPLASTY Left 05/02/2019   Procedure: TOTAL KNEE ARTHROPLASTY;  Surgeon: Gaynelle Arabian, MD;  [facility]: [facility];  Service: Orthopedics;  Laterality: Left;  87mn  Current Medications: Current Meds  Medication Sig   amLODipine (NORVASC) 5 MG tablet Take 1 tablet (5 mg total) by mouth daily.   Biotin 5000 MCG TABS Take 5,000 mcg by mouth daily.   Coenzyme Q10 (CO Q 10) 100 MG CAPS Take 100 mg by mouth daily.    Ergocalciferol 10 MCG (400 UNIT) TABS ergocalciferol (vitamin D2) 10 mcg (400 unit) tablet   400 units by oral route.   losartan (COZAAR) 100 MG tablet Take 100 mg by mouth every morning.    methocarbamol (ROBAXIN) 750 MG tablet Take 750 mg by mouth 3 (three) times daily as needed for muscle spasms.   simvastatin (ZOCOR) 10 MG  tablet Take 10 mg by mouth 2 (two) times a week.   spironolactone-hydrochlorothiazide (ALDACTAZIDE) 25-25 MG tablet Take 1 tablet by mouth every other day.     Allergies:   Biaxin [clarithromycin] and Rifampin   Social History   Socioeconomic History   Marital status: Widowed    Spouse name: Not on file   Number of children: Not on file   Years of education: Not on file   Highest education level: Not on file  Occupational History   Not on file  Tobacco Use   Smoking status: Former    Packs/day: 0.25    Years: 10.00    Total pack years: 2.50    Types: Cigarettes    Quit date: 04/12/1985    Years since quitting: 37.7   Smokeless tobacco: Never  Vaping Use   Vaping Use: Never used  Substance and Sexual Activity   Alcohol use: Not Currently    Comment: RARE GLASS WINE   Drug use: No   Sexual activity: Not on file  Other Topics Concern   Not on file  Social History Narrative   Not on file   Social Determinants of Health   Financial Resource Strain: Not on file  Food Insecurity: Not on file  Transportation Needs: Not on file  Physical Activity: Not on file  Stress: Not on file  Social Connections: Not on file     Family History: The patient's family history includes Cancer (age of onset: 14) in her brother; Heart attack in her father; Other in her mother. ROS:   Please see the history of present illness.    All other systems reviewed and are negative.  EKGs/Labs/Other Studies Reviewed:    The following studies were reviewed today:  EKG:  EKG ordered today and personally reviewed.  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  Recent Labs: 07/04/2022 Cholesterol 164 LDL 112 A1c 5.6% hemoglobin 15.0 creatinine 0.8 potassium 4.0   Physical Exam:    VS:  BP 126/68 (BP Location: Right Arm, Patient Position: Sitting)   Pulse 68   Ht '5\' 2"'$  (1.575 m)   Wt 163 lb 6.4 oz (74.1 kg)   SpO2 97%   BMI 29.89 kg/m     Wt Readings  from Last 3 Encounters:  01/01/23 163 lb 6.4 oz (74.1 kg)  07/04/22 168 lb 6.4 oz (76.4 kg)  10/07/21 165 lb (74.8 kg)     GEN: She looks young for age well nourished, well developed in no acute distress HEENT: Normal NECK: No JVD; No carotid bruits LYMPHATICS: No lymphadenopathy CARDIAC: 6 murmur aortic stenosis heard in the aortic area radiates to the right clavicle not to the carotids S2 is normal no AR regular rhythm RESPIRATORY:  Clear to auscultation without rales, wheezing  or rhonchi  ABDOMEN: Soft, non-tender, non-distended MUSCULOSKELETAL:  No edema; No deformity  SKIN: Warm and dry NEUROLOGIC:  Alert and oriented x 3 PSYCHIATRIC:  Normal affect   Seen with Leighton Ruff, CMA chaperone   Signed, Shirlee More, MD  01/01/2023 11:38 AM    [place]

## 2023-01-01 ENCOUNTER — Encounter: Payer: Self-pay | Admitting: Cardiology

## 2023-01-01 ENCOUNTER — Ambulatory Visit: Payer: Medicare Other | Attending: Cardiology | Admitting: Cardiology

## 2023-01-01 VITALS — BP 126/68 | HR 68 | Ht 62.0 in | Wt 163.4 lb

## 2023-01-01 DIAGNOSIS — E782 Mixed hyperlipidemia: Secondary | ICD-10-CM

## 2023-01-01 DIAGNOSIS — I119 Hypertensive heart disease without heart failure: Secondary | ICD-10-CM | POA: Insufficient documentation

## 2023-01-01 DIAGNOSIS — I350 Nonrheumatic aortic (valve) stenosis: Secondary | ICD-10-CM | POA: Insufficient documentation

## 2023-01-01 NOTE — Patient Instructions (Signed)
Medication Instructions:  Your physician recommends that you continue on your current medications as directed. Please refer to the Current Medication list given to you today.  *If you need a refill on your cardiac medications before your next appointment, please call your pharmacy*   Lab Work: None If you have labs (blood work) drawn today and your tests are completely normal, you will receive your results only by: [place] (if you have MyChart) OR A paper copy in the mail If you have any lab test that is abnormal or we need to change your treatment, we will call you to review the results.   Testing/Procedures: Your physician has requested that you have an echocardiogram. Echocardiography is a painless test that uses sound waves to create images of your heart. It provides your doctor with information about the size and shape of your heart and how well your heart's chambers and valves are working. This procedure takes approximately one hour. There are no restrictions for this procedure. Please do NOT wear cologne, perfume, aftershave, or lotions (deodorant is allowed). Please arrive 15 minutes prior to your appointment time.    Follow-Up: At [facility], you and your health needs are our priority.  As part of our continuing mission to provide you with exceptional heart care, we have created designated Provider Care Teams.  These Care Teams include your primary Cardiologist (physician) and Advanced Practice Providers (APPs -  Physician Assistants and Nurse Practitioners) who all work together to provide you with the care you need, when you need it.  We recommend signing up for the patient portal called "MyChart".  Sign up information is provided on this After Visit Summary.  MyChart is used to connect with patients for Virtual Visits (Telemedicine).  Patients are able to view lab/test results, encounter notes, upcoming appointments, etc.  Non-urgent messages can be sent to your  provider as well.   To learn more about what you can do with MyChart, go to [email or web address].    Your next appointment:   6 month(s)  Provider:   Shirlee More, MD  Other Instructions None  This visit was accompanied by Leighton Ruff.

## 2023-01-01 NOTE — Discharge Summary (Signed)
This visit was accompanied by Fayne Mcguffee, CMA.  

## 2023-01-13 DIAGNOSIS — M810 Age-related osteoporosis without current pathological fracture: Secondary | ICD-10-CM | POA: Diagnosis not present

## 2023-01-13 DIAGNOSIS — I672 Cerebral atherosclerosis: Secondary | ICD-10-CM | POA: Diagnosis not present

## 2023-01-13 DIAGNOSIS — E785 Hyperlipidemia, unspecified: Secondary | ICD-10-CM | POA: Diagnosis not present

## 2023-01-13 DIAGNOSIS — I080 Rheumatic disorders of both mitral and aortic valves: Secondary | ICD-10-CM | POA: Diagnosis not present

## 2023-01-13 DIAGNOSIS — I10 Essential (primary) hypertension: Secondary | ICD-10-CM | POA: Diagnosis not present

## 2023-01-13 DIAGNOSIS — Z79899 Other long term (current) drug therapy: Secondary | ICD-10-CM | POA: Diagnosis not present

## 2023-01-13 DIAGNOSIS — Z0000 Encounter for general adult medical examination without abnormal findings: Secondary | ICD-10-CM | POA: Diagnosis not present

## 2023-02-12 DIAGNOSIS — H524 Presbyopia: Secondary | ICD-10-CM | POA: Diagnosis not present

## 2023-02-12 DIAGNOSIS — H52223 Regular astigmatism, bilateral: Secondary | ICD-10-CM | POA: Diagnosis not present

## 2023-02-12 DIAGNOSIS — H25813 Combined forms of age-related cataract, bilateral: Secondary | ICD-10-CM | POA: Diagnosis not present

## 2023-02-12 DIAGNOSIS — H5203 Hypermetropia, bilateral: Secondary | ICD-10-CM | POA: Diagnosis not present

## 2023-02-13 DIAGNOSIS — Z6829 Body mass index (BMI) 29.0-29.9, adult: Secondary | ICD-10-CM | POA: Diagnosis not present

## 2023-02-13 DIAGNOSIS — H832X3 Labyrinthine dysfunction, bilateral: Secondary | ICD-10-CM | POA: Diagnosis not present

## 2023-02-13 DIAGNOSIS — I10 Essential (primary) hypertension: Secondary | ICD-10-CM | POA: Diagnosis not present

## 2023-02-13 DIAGNOSIS — E663 Overweight: Secondary | ICD-10-CM | POA: Diagnosis not present

## 2023-03-24 DIAGNOSIS — M25472 Effusion, left ankle: Secondary | ICD-10-CM | POA: Diagnosis not present

## 2023-03-24 DIAGNOSIS — M25572 Pain in left ankle and joints of left foot: Secondary | ICD-10-CM | POA: Diagnosis not present

## 2023-03-25 DIAGNOSIS — H40051 Ocular hypertension, right eye: Secondary | ICD-10-CM | POA: Diagnosis not present

## 2023-03-25 DIAGNOSIS — H268 Other specified cataract: Secondary | ICD-10-CM | POA: Diagnosis not present

## 2023-03-25 DIAGNOSIS — H25811 Combined forms of age-related cataract, right eye: Secondary | ICD-10-CM | POA: Diagnosis not present

## 2023-05-04 DIAGNOSIS — H401491 Capsular glaucoma with pseudoexfoliation of lens, unspecified eye, mild stage: Secondary | ICD-10-CM | POA: Diagnosis not present

## 2023-05-04 DIAGNOSIS — H401411 Capsular glaucoma with pseudoexfoliation of lens, right eye, mild stage: Secondary | ICD-10-CM | POA: Diagnosis not present

## 2023-05-04 DIAGNOSIS — H2511 Age-related nuclear cataract, right eye: Secondary | ICD-10-CM | POA: Diagnosis not present

## 2023-05-14 DIAGNOSIS — J0140 Acute pansinusitis, unspecified: Secondary | ICD-10-CM | POA: Diagnosis not present

## 2023-05-14 DIAGNOSIS — I10 Essential (primary) hypertension: Secondary | ICD-10-CM | POA: Diagnosis not present

## 2023-05-19 DIAGNOSIS — J0140 Acute pansinusitis, unspecified: Secondary | ICD-10-CM | POA: Diagnosis not present

## 2023-05-19 DIAGNOSIS — I10 Essential (primary) hypertension: Secondary | ICD-10-CM | POA: Diagnosis not present

## 2023-05-19 DIAGNOSIS — Z6829 Body mass index (BMI) 29.0-29.9, adult: Secondary | ICD-10-CM | POA: Diagnosis not present

## 2023-05-19 DIAGNOSIS — E663 Overweight: Secondary | ICD-10-CM | POA: Diagnosis not present

## 2023-07-13 ENCOUNTER — Ambulatory Visit: Payer: Medicare Other | Attending: Cardiology

## 2023-07-13 DIAGNOSIS — I119 Hypertensive heart disease without heart failure: Secondary | ICD-10-CM | POA: Diagnosis not present

## 2023-07-13 DIAGNOSIS — I350 Nonrheumatic aortic (valve) stenosis: Secondary | ICD-10-CM | POA: Diagnosis not present

## 2023-07-13 DIAGNOSIS — E782 Mixed hyperlipidemia: Secondary | ICD-10-CM | POA: Diagnosis not present

## 2023-07-13 LAB — ECHOCARDIOGRAM COMPLETE
AR max vel: 1.23 cm2
AV Area VTI: 1.32 cm2
AV Area mean vel: 1.25 cm2
AV Mean grad: 15.2 mmHg
AV Peak grad: 28.4 mmHg
Ao pk vel: 2.66 m/s
P 1/2 time: 613 msec
S' Lateral: 2.8 cm

## 2023-07-19 NOTE — Progress Notes (Unsigned)
Cardiology Office Note:    Date:  07/20/2023   ID:  [name], DOB 28-Jun-1929, MRN [id number]  PCP:  Street, Stephanie Coup, MD  Cardiologist:  Norman Herrlich, MD    Referring MD: [id number] Stephanie Coup, *    ASSESSMENT:    1. Nonrheumatic aortic valve stenosis   2. Nonrheumatic aortic valve insufficiency   3. Hypertensive heart disease without heart failure   4. Mixed hyperlipidemia    PLAN:    In order of problems listed above:  She continues to do well lives independently active exercises daily and has no symptoms were aortic stenosis and regurgitation neither of which is severe and has not progressed we will plan a repeat echocardiogram 1 year follow-up and if she develops symptomatic severe aortic stenosis she would benefit from TAVR Hypertension is well-controlled I asked her to be more diligent in checking her home blood pressure bring readings to the office continue her amlodipine and diuretic will recheck renal function potassium today She will continue her intermediate intensity statin check lipids today   Next appointment: 1 year should have an echocardiogram 1 to 2 weeks before visit   Medication Adjustments/Labs and Tests Ordered: Current medicines are reviewed at length with the patient today.  Concerns regarding medicines are outlined above.  No orders of the defined types were placed in this encounter.  No orders of the defined types were placed in this encounter.    History of Present Illness:    Ashley Ruiz is a 89 y.o. female with a hx of asymptomatic mild to moderate aortic stenosis hypertension and hyper lipidemia last seen 01/01/2023.  07/13/2023 she had an echocardiogram showing the aortic valve to be mildly calcified with mild regurgitation and mild aortic stenosis mean gradient of 15 mmHg.  Compliance with diet, lifestyle and medications: Yes  She does not take aspirin that was discontinued when she had repeated epistasis Home blood  pressure runs 120s 130s systolic She remains quite active still has her swimming program and has had no cardiovascular symptoms of edema shortness of breath chest pain palpitation or syncope Labs done in February shows a cholesterol 178 LDL 112 A1c 5.6 hemoglobin 14.5 creatinine 0.8 Past Medical History:  Diagnosis Date   Aortic stenosis    peak/mean pressure gradient 28/16 mmHg by echo at [facility] 12/28/15   Arthritis    OA AND PAIN BOTH KNEES; SOME PAIN IN RT HIP   Essential hypertension 02/01/2016   Heart murmur    Hip arthritis 02/01/2016   History of anemia    History of gallstones    History of total knee replacement, bilateral 05/16/2019   Hypercholesteremia    Hyperlipidemia 02/01/2016   Hypertension    PONV (postoperative nausea and vomiting)    Skin cancer    Stroke [facility])    ?   SMALL TIA , PATIENT HAD 1 EPISODE DOUBLE VISION 12/2014   TIA (transient ischemic attack) 02/09/2017    Current Medications: Current Meds  Medication Sig   amLODipine (NORVASC) 5 MG tablet Take 1 tablet (5 mg total) by mouth daily.   Biotin 5000 MCG TABS Take 5,000 mcg by mouth daily.   Coenzyme Q10 (CO Q 10) 100 MG CAPS Take 100 mg by mouth daily.    Ergocalciferol 10 MCG (400 UNIT) TABS ergocalciferol (vitamin D2) 10 mcg (400 unit) tablet   400 units by oral route.   losartan (COZAAR) 100 MG tablet Take 100 mg by mouth every morning.  methocarbamol (ROBAXIN) 750 MG tablet Take 750 mg by mouth 3 (three) times daily as needed for muscle spasms.   simvastatin (ZOCOR) 10 MG tablet Take 10 mg by mouth 2 (two) times a week.   spironolactone-hydrochlorothiazide (ALDACTAZIDE) 25-25 MG tablet Take 1 tablet by mouth every other day.      EKGs/Labs/Other Studies Reviewed:     Physical Exam:    VS:  BP 138/60 (BP Location: Right Arm, Patient Position: Sitting, Cuff Size: Normal)   Pulse 71   Ht 5\' 2"  (1.575 m)   Wt 163 lb 6.4 oz (74.1 kg)   SpO2 94%   BMI 29.89 kg/m     Wt Readings from  Last 3 Encounters:  07/20/23 163 lb 6.4 oz (74.1 kg)  01/01/23 163 lb 6.4 oz (74.1 kg)  07/04/22 168 lb 6.4 oz (76.4 kg)     GEN: Looks young for age well nourished, well developed in no acute distress HEENT: Normal NECK: No JVD; No carotid bruits LYMPHATICS: No lymphadenopathy CARDIAC: 2/6 murmur AAS systolic does not radiate to the carotids does not encompass S2 and I cannot auscultate aortic regurgitation RRR, no murmurs, rubs, gallops RESPIRATORY:  Clear to auscultation without rales, wheezing or rhonchi  ABDOMEN: Soft, non-tender, non-distended MUSCULOSKELETAL:  No edema; No deformity  SKIN: Warm and dry NEUROLOGIC:  Alert and oriented x 3 PSYCHIATRIC:  Normal affect    Signed, Norman Herrlich, MD  07/20/2023 8:40 AM    [facility]

## 2023-07-20 ENCOUNTER — Ambulatory Visit: Payer: Medicare Other | Attending: Cardiology | Admitting: Cardiology

## 2023-07-20 ENCOUNTER — Encounter: Payer: Self-pay | Admitting: Cardiology

## 2023-07-20 VITALS — BP 138/60 | HR 71 | Ht 62.0 in | Wt 163.4 lb

## 2023-07-20 DIAGNOSIS — I350 Nonrheumatic aortic (valve) stenosis: Secondary | ICD-10-CM | POA: Insufficient documentation

## 2023-07-20 DIAGNOSIS — I351 Nonrheumatic aortic (valve) insufficiency: Secondary | ICD-10-CM | POA: Diagnosis not present

## 2023-07-20 DIAGNOSIS — E782 Mixed hyperlipidemia: Secondary | ICD-10-CM | POA: Insufficient documentation

## 2023-07-20 DIAGNOSIS — I119 Hypertensive heart disease without heart failure: Secondary | ICD-10-CM | POA: Insufficient documentation

## 2023-07-20 NOTE — Patient Instructions (Signed)
Medication Instructions:  Your physician recommends that you continue on your current medications as directed. Please refer to the Current Medication list given to you today.  *If you need a refill on your cardiac medications before your next appointment, please call your pharmacy*   Lab Work: Your physician recommends that you return for lab work in:   Labs today: CMP, Lipids   If you have labs (blood work) drawn today and your tests are completely normal, you will receive your results only by: MyChart Message (if you have MyChart) OR A paper copy in the mail If you have any lab test that is abnormal or we need to change your treatment, we will call you to review the results.   Testing/Procedures: Your physician has requested that you have an echocardiogram. Echocardiography is a painless test that uses sound waves to create images of your heart. It provides your doctor with information about the size and shape of your heart and how well your heart's chambers and valves are working. This procedure takes approximately one hour. There are no restrictions for this procedure. Please do NOT wear cologne, perfume, aftershave, or lotions (deodorant is allowed). Please arrive 15 minutes prior to your appointment time.    Follow-Up: At [facility], you and your health needs are our priority.  As part of our continuing mission to provide you with exceptional heart care, we have created designated Provider Care Teams.  These Care Teams include your primary Cardiologist (physician) and Advanced Practice Providers (APPs -  Physician Assistants and Nurse Practitioners) who all work together to provide you with the care you need, when you need it.  We recommend signing up for the patient portal called "MyChart".  Sign up information is provided on this After Visit Summary.  MyChart is used to connect with patients for Virtual Visits (Telemedicine).  Patients are able to view lab/test results,  encounter notes, upcoming appointments, etc.  Non-urgent messages can be sent to your provider as well.   To learn more about what you can do with MyChart, go to [email or web address].    Your next appointment:   1 year(s)  Provider:   Norman Herrlich, MD    Other Instructions None

## 2023-07-21 LAB — COMPREHENSIVE METABOLIC PANEL
ALT: 15 IU/L (ref 0–32)
AST: 22 IU/L (ref 0–40)
Albumin: 4.2 g/dL (ref 3.6–4.6)
Alkaline Phosphatase: 74 IU/L (ref 44–121)
BUN/Creatinine Ratio: 24 (ref 12–28)
BUN: 16 mg/dL (ref 10–36)
Bilirubin Total: 0.3 mg/dL (ref 0.0–1.2)
CO2: 24 mmol/L (ref 20–29)
Calcium: 10.5 mg/dL — ABNORMAL HIGH (ref 8.7–10.3)
Chloride: 102 mmol/L (ref 96–106)
Creatinine, Ser: 0.68 mg/dL (ref 0.57–1.00)
Globulin, Total: 2.5 g/dL (ref 1.5–4.5)
Glucose: 92 mg/dL (ref 70–99)
Potassium: 4.1 mmol/L (ref 3.5–5.2)
Sodium: 137 mmol/L (ref 134–144)
Total Protein: 6.7 g/dL (ref 6.0–8.5)
eGFR: 82 mL/min/{1.73_m2} (ref >59)

## 2023-07-21 LAB — LIPID PANEL
Chol/HDL Ratio: 3.5 ratio (ref 0.0–4.4)
Cholesterol, Total: 188 mg/dL (ref 100–199)
HDL: 53 mg/dL (ref >39)
LDL Chol Calc (NIH): 107 mg/dL — ABNORMAL HIGH (ref 0–99)
Triglycerides: 160 mg/dL — ABNORMAL HIGH (ref 0–149)
VLDL Cholesterol Cal: 28 mg/dL (ref 5–40)

## 2023-07-23 DIAGNOSIS — R300 Dysuria: Secondary | ICD-10-CM | POA: Diagnosis not present

## 2023-07-23 DIAGNOSIS — Z6829 Body mass index (BMI) 29.0-29.9, adult: Secondary | ICD-10-CM | POA: Diagnosis not present

## 2023-08-09 DIAGNOSIS — B372 Candidiasis of skin and nail: Secondary | ICD-10-CM | POA: Diagnosis not present

## 2023-08-17 DIAGNOSIS — L304 Erythema intertrigo: Secondary | ICD-10-CM | POA: Diagnosis not present

## 2023-08-17 DIAGNOSIS — Z6829 Body mass index (BMI) 29.0-29.9, adult: Secondary | ICD-10-CM | POA: Diagnosis not present

## 2023-08-27 DIAGNOSIS — Z23 Encounter for immunization: Secondary | ICD-10-CM | POA: Diagnosis not present

## 2023-09-15 DIAGNOSIS — L820 Inflamed seborrheic keratosis: Secondary | ICD-10-CM | POA: Diagnosis not present

## 2023-09-16 DIAGNOSIS — H268 Other specified cataract: Secondary | ICD-10-CM | POA: Diagnosis not present

## 2023-09-30 DIAGNOSIS — I10 Essential (primary) hypertension: Secondary | ICD-10-CM | POA: Diagnosis not present

## 2023-09-30 DIAGNOSIS — Z6830 Body mass index (BMI) 30.0-30.9, adult: Secondary | ICD-10-CM | POA: Diagnosis not present

## 2023-09-30 DIAGNOSIS — M7062 Trochanteric bursitis, left hip: Secondary | ICD-10-CM | POA: Diagnosis not present

## 2023-09-30 DIAGNOSIS — E669 Obesity, unspecified: Secondary | ICD-10-CM | POA: Diagnosis not present

## 2023-11-04 DIAGNOSIS — J40 Bronchitis, not specified as acute or chronic: Secondary | ICD-10-CM | POA: Diagnosis not present

## 2023-11-04 DIAGNOSIS — Z6829 Body mass index (BMI) 29.0-29.9, adult: Secondary | ICD-10-CM | POA: Diagnosis not present

## 2023-11-04 DIAGNOSIS — J329 Chronic sinusitis, unspecified: Secondary | ICD-10-CM | POA: Diagnosis not present

## 2024-01-04 DIAGNOSIS — Z6829 Body mass index (BMI) 29.0-29.9, adult: Secondary | ICD-10-CM | POA: Diagnosis not present

## 2024-01-04 DIAGNOSIS — M79641 Pain in right hand: Secondary | ICD-10-CM | POA: Diagnosis not present

## 2024-01-04 DIAGNOSIS — M79642 Pain in left hand: Secondary | ICD-10-CM | POA: Diagnosis not present

## 2024-01-19 DIAGNOSIS — Z0000 Encounter for general adult medical examination without abnormal findings: Secondary | ICD-10-CM | POA: Diagnosis not present

## 2024-01-19 DIAGNOSIS — R7302 Impaired glucose tolerance (oral): Secondary | ICD-10-CM | POA: Diagnosis not present

## 2024-01-19 DIAGNOSIS — E785 Hyperlipidemia, unspecified: Secondary | ICD-10-CM | POA: Diagnosis not present

## 2024-01-19 DIAGNOSIS — M4727 Other spondylosis with radiculopathy, lumbosacral region: Secondary | ICD-10-CM | POA: Diagnosis not present

## 2024-01-19 DIAGNOSIS — Z79899 Other long term (current) drug therapy: Secondary | ICD-10-CM | POA: Diagnosis not present

## 2024-01-19 DIAGNOSIS — I10 Essential (primary) hypertension: Secondary | ICD-10-CM | POA: Diagnosis not present

## 2024-01-19 DIAGNOSIS — I672 Cerebral atherosclerosis: Secondary | ICD-10-CM | POA: Diagnosis not present

## 2024-01-27 DIAGNOSIS — R300 Dysuria: Secondary | ICD-10-CM | POA: Diagnosis not present

## 2024-01-27 DIAGNOSIS — Z6830 Body mass index (BMI) 30.0-30.9, adult: Secondary | ICD-10-CM | POA: Diagnosis not present

## 2024-01-27 DIAGNOSIS — J0140 Acute pansinusitis, unspecified: Secondary | ICD-10-CM | POA: Diagnosis not present

## 2024-01-27 LAB — LAB REPORT - SCANNED
A1c: 5.7
EGFR: 60

## 2024-02-12 DIAGNOSIS — Z96653 Presence of artificial knee joint, bilateral: Secondary | ICD-10-CM | POA: Diagnosis not present

## 2024-02-12 DIAGNOSIS — M25552 Pain in left hip: Secondary | ICD-10-CM | POA: Diagnosis not present

## 2024-02-15 ENCOUNTER — Telehealth: Payer: Self-pay | Admitting: Cardiology

## 2024-02-15 NOTE — Telephone Encounter (Signed)
 Pt called in asking for Dr. Hulen Shouts opinion on her getting shot for RSV.

## 2024-02-15 NOTE — Telephone Encounter (Signed)
 Please advise upon your return  Spoke with pt and advised that Dr. Dulce Sellar is out of the office until 3/31 at which time he will advise then.

## 2024-02-18 NOTE — Telephone Encounter (Signed)
 Recommendations reviewed with pt as per Dr. Kem Parkinson note.  Pt verbalized understanding and had no additional questions.

## 2024-03-14 DIAGNOSIS — M62838 Other muscle spasm: Secondary | ICD-10-CM | POA: Diagnosis not present

## 2024-03-16 DIAGNOSIS — H268 Other specified cataract: Secondary | ICD-10-CM | POA: Diagnosis not present

## 2024-03-16 DIAGNOSIS — H40051 Ocular hypertension, right eye: Secondary | ICD-10-CM | POA: Diagnosis not present

## 2024-06-28 DIAGNOSIS — L700 Acne vulgaris: Secondary | ICD-10-CM | POA: Diagnosis not present

## 2024-06-28 DIAGNOSIS — C4441 Basal cell carcinoma of skin of scalp and neck: Secondary | ICD-10-CM | POA: Diagnosis not present

## 2024-07-05 ENCOUNTER — Ambulatory Visit: Attending: Cardiology

## 2024-07-05 DIAGNOSIS — I351 Nonrheumatic aortic (valve) insufficiency: Secondary | ICD-10-CM | POA: Insufficient documentation

## 2024-07-05 DIAGNOSIS — I350 Nonrheumatic aortic (valve) stenosis: Secondary | ICD-10-CM | POA: Diagnosis not present

## 2024-07-05 DIAGNOSIS — E782 Mixed hyperlipidemia: Secondary | ICD-10-CM | POA: Insufficient documentation

## 2024-07-05 DIAGNOSIS — I119 Hypertensive heart disease without heart failure: Secondary | ICD-10-CM | POA: Diagnosis not present

## 2024-07-05 LAB — ECHOCARDIOGRAM COMPLETE
AR max vel: 1.1 cm2
AV Area VTI: 1.2 cm2
AV Area mean vel: 1.08 cm2
AV Mean grad: 19.2 mmHg
AV Peak grad: 35.7 mmHg
AV Vena cont: 0.2 cm
Ao pk vel: 2.99 m/s
Area-P 1/2: 2.57 cm2
P 1/2 time: 832 ms
S' Lateral: 2.8 cm

## 2024-07-06 ENCOUNTER — Encounter: Payer: Self-pay | Admitting: Cardiology

## 2024-07-14 NOTE — Progress Notes (Signed)
 Cardiology Office Note:    Date:  07/18/2024   ID:  Ashley Ruiz, DOB 1929/10/06, MRN [id number]  PCP:  Street, Lonni HERO, MD  Cardiologist:  Redell Leiter, MD    Referring MD: [id number] Lonni HERO, *    ASSESSMENT:    1. Nonrheumatic aortic valve stenosis   2. Nonrheumatic aortic valve insufficiency   3. Hypertensive heart disease without heart failure   4. Mixed hyperlipidemia    PLAN:    In order of problems listed above:  She continues to do well from a cardiac perspective her aortic stenosis and regurgitation has not progressed and we will continue her current medical regimen including antihypertensives and statin Well-controlled hypertension continue her current regimen of the low-dose thiazide and distal diuretic and amlodipine  Continue her statin lipids are at target She has resting sinus bradycardia 49 bpm asymptomatic she will get back in the habit of checking heart rate and blood pressure daily and if she is getting rates less than 45 will contact me we will set up an event monitor I do not think is needed at this time and we very careful to avoid rate slowing medications   Next appointment: 12 months show with an echocardiogram 1 week before that visit   Medication Adjustments/Labs and Tests Ordered: Current medicines are reviewed at length with the patient today.  Concerns regarding medicines are outlined above.  Orders Placed This Encounter  Procedures   EKG 12-Lead   No orders of the defined types were placed in this encounter.    History of Present Illness:    Ashley Ruiz is a 89 y.o. female with a hx of asymptomatic mild to moderate aortic stenosis hypertension and hyperlipidemia last seen 07/07/2023.  Most recent echocardiogram 07/05/2024 showed preserved left ventricular function EF 60-65 with normal GLS impaired relaxation and continued mild aortic stenosis peak and mean gradients 36 and 19 mmHg and mild aortic regurgitation with normal  SVI.  Recent labs with her PCP showed normal CBC 01/19/2024 hemoglobin 14.4 platelets 179,000 A1c 5.7% cholesterol 181 LDL 109 non-HDL cholesterol 131 CMP was normal creatinine 0.7 potassium 4.2 TSH T4 were normal.   Compliance with diet, lifestyle and medications: Yes  Her cardiac perspective is really doing very well continues to exercise she is in a pool program and has no cardiovascular symptoms of chest pain shortness of breath or syncope.  Aortic stenosis has not progressed.  She is on lipid-lowering therapy without muscle pain or weakness. Past Medical History:  Diagnosis Date   Aortic stenosis    peak/mean pressure gradient 28/16 mmHg by echo at [facility] 12/28/15   Arthritis    OA AND PAIN BOTH KNEES; SOME PAIN IN RT HIP   Cancer [facility])    SKIN CANCER ON LT EAR     Essential hypertension 02/01/2016   Heart murmur    Hip arthritis 02/01/2016   History of anemia    History of gallstones    History of total knee replacement, bilateral 05/16/2019   Hypercholesteremia    Hyperlipidemia 02/01/2016   Hypertension    Moderate aortic stenosis 02/01/2016   OA (osteoarthritis) of hip 03/12/2016   OA (osteoarthritis) of knee 01/23/2014   PONV (postoperative nausea and vomiting)    Skin cancer    Stroke [facility])    ?   SMALL TIA , PATIENT HAD 1 EPISODE DOUBLE VISION 12/2014   TIA (transient ischemic attack) 02/09/2017    Current Medications: Current Meds  Medication Sig  amLODipine  (NORVASC ) 5 MG tablet Take 1 tablet (5 mg total) by mouth daily.   Biotin 5000 MCG TABS Take 5,000 mcg by mouth daily.   Coenzyme Q10 (CO Q 10) 100 MG CAPS Take 100 mg by mouth daily.    Ergocalciferol 10 MCG (400 UNIT) TABS ergocalciferol (vitamin D2) 10 mcg (400 unit) tablet   400 units by oral route.   losartan  (COZAAR ) 100 MG tablet Take 100 mg by mouth every morning.    methocarbamol  (ROBAXIN ) 750 MG tablet Take 750 mg by mouth 3 (three) times daily as needed for muscle spasms.   simvastatin   (ZOCOR ) 10 MG tablet Take 10 mg by mouth 2 (two) times a week.   spironolactone -hydrochlorothiazide  (ALDACTAZIDE ) 25-25 MG tablet Take 1 tablet by mouth every other day.      EKGs/Labs/Other Studies Reviewed:    The following studies were reviewed today:  Cardiac Studies & Procedures   ______________________________________________________________________________________________   STRESS TESTS  MYOCARDIAL PERFUSION IMAGING 02/13/2016  Interpretation Summary  Nuclear stress EF: 73%.  There was no ST segment deviation noted during stress.  The study is normal.  This is a low risk study.  Low risk stress nuclear study with normal perfusion and normal left ventricular regional and global systolic function.   ECHOCARDIOGRAM  ECHOCARDIOGRAM COMPLETE 07/05/2024  Narrative ECHOCARDIOGRAM REPORT    Patient Name:   Ashley Ruiz [facility] Date of Exam: 07/05/2024 Medical Rec #:  [id number]       Height:       62.0 in Accession #:    [id number]      Weight:       163.4 lb Date of Birth:  November 26, 1928       BSA:          1.754 m Patient Age:    89 years        BP:           138/60 mmHg Patient Gender: F               HR:           53 bpm. Exam Location:  [place]  Procedure: 2D Echo, Cardiac Doppler, Color Doppler and Strain Analysis (Both Spectral and Color Flow Doppler were utilized during procedure).  Indications:    Nonrheumatic aortic valve stenosis [I35.0 (ICD-10-CM)]; Nonrheumatic aortic valve insufficiency [I35.1 (ICD-10-CM)]; Hypertensive heart disease without heart failure [I11.9 (ICD-10-CM)]; Mixed hyperlipidemia [E78.2 (ICD-10-CM)]  History:        Patient has prior history of Echocardiogram examinations, most recent 07/13/2023. Aortic Valve Disease; Risk Factors:Hypertension and Dyslipidemia.  Sonographer:    Charlie Jointer RDCS Referring Phys: [id number] Woodrow Drab J Danyka Merlin  IMPRESSIONS   1. Left ventricular ejection fraction, by estimation, is 60 to 65%. The left  ventricle has normal function. The left ventricle has no regional wall motion abnormalities. Left ventricular diastolic parameters are consistent with Grade I diastolic dysfunction (impaired relaxation). The average left ventricular global longitudinal strain is -21.3 %. The global longitudinal strain is normal. 2. Right ventricular systolic function is normal. The right ventricular size is normal. There is normal pulmonary artery systolic pressure. 3. Left atrial size was mildly dilated. 4. The mitral valve is normal in structure. No evidence of mitral valve regurgitation. No evidence of mitral stenosis. 5. The aortic valve was not well visualized. Aortic valve regurgitation is mild. Mild aortic valve stenosis. 6. The inferior vena cava is normal in size with greater than 50% respiratory variability, suggesting right atrial pressure of 3  mmHg.  FINDINGS Left Ventricle: Left ventricular ejection fraction, by estimation, is 60 to 65%. The left ventricle has normal function. The left ventricle has no regional wall motion abnormalities. The average left ventricular global longitudinal strain is -21.3 %. Strain was performed and the global longitudinal strain is normal. The left ventricular internal cavity size was normal in size. There is no left ventricular hypertrophy. Left ventricular diastolic parameters are consistent with Grade I diastolic dysfunction (impaired relaxation).  Right Ventricle: The right ventricular size is normal. No increase in right ventricular wall thickness. Right ventricular systolic function is normal. There is normal pulmonary artery systolic pressure. The tricuspid regurgitant velocity is 2.81 m/s, and with an assumed right atrial pressure of 3 mmHg, the estimated right ventricular systolic pressure is 34.6 mmHg.  Left Atrium: Left atrial size was mildly dilated.  Right Atrium: Right atrial size was normal in size.  Pericardium: There is no evidence of pericardial  effusion.  Mitral Valve: The mitral valve is normal in structure. No evidence of mitral valve regurgitation. No evidence of mitral valve stenosis.  Tricuspid Valve: The tricuspid valve is normal in structure. Tricuspid valve regurgitation is not demonstrated. No evidence of tricuspid stenosis.  Aortic Valve: The aortic valve was not well visualized. Aortic valve regurgitation is mild. Aortic regurgitation PHT measures 832 msec. Mild aortic stenosis is present. Aortic valve mean gradient measures 19.2 mmHg. Aortic valve peak gradient measures 35.7 mmHg. Aortic valve area, by VTI measures 1.20 cm.  Pulmonic Valve: The pulmonic valve was normal in structure. Pulmonic valve regurgitation is not visualized. No evidence of pulmonic stenosis.  Aorta: The aortic root is normal in size and structure.  Venous: The inferior vena cava is normal in size with greater than 50% respiratory variability, suggesting right atrial pressure of 3 mmHg.  IAS/Shunts: No atrial level shunt detected by color flow Doppler.   LEFT VENTRICLE PLAX 2D LVIDd:         4.30 cm   Diastology LVIDs:         2.80 cm   LV e' medial:    6.31 cm/s LV PW:         1.00 cm   LV E/e' medial:  11.0 LV IVS:        1.40 cm   LV e' lateral:   4.79 cm/s LVOT diam:     2.00 cm   LV E/e' lateral: 14.5 LV SV:         85 LV SV Index:   48        2D Longitudinal Strain LVOT Area:     3.14 cm  2D Strain GLS Avg:     -21.3 %   RIGHT VENTRICLE             IVC RV Basal diam:  3.20 cm     IVC diam: 1.50 cm RV Mid diam:    2.40 cm RV S prime:     23.10 cm/s TAPSE (M-mode): 3.0 cm  LEFT ATRIUM             Index        RIGHT ATRIUM           Index LA diam:        4.30 cm 2.45 cm/m   RA Area:     11.70 cm LA Vol (A2C):   36.8 ml 20.98 ml/m  RA Volume:   18.80 ml  10.72 ml/m LA Vol (A4C):   54.5 ml 31.07 ml/m LA  Biplane Vol: 46.5 ml 26.51 ml/m AORTIC VALVE AV Area (Vmax):    1.10 cm AV Area (Vmean):   1.08 cm AV Area (VTI):      1.20 cm AV Vmax:           298.60 cm/s AV Vmean:          200.600 cm/s AV VTI:            0.704 m AV Peak Grad:      35.7 mmHg AV Mean Grad:      19.2 mmHg LVOT Vmax:         105.00 cm/s LVOT Vmean:        68.767 cm/s LVOT VTI:          0.270 m LVOT/AV VTI ratio: 0.38 AI PHT:            832 msec AR Vena Contracta: 0.20 cm  AORTA Ao Root diam: 3.00 cm Ao Asc diam:  3.20 cm Ao Desc diam: 2.00 cm  MITRAL VALVE                TRICUSPID VALVE MV Area (PHT): 2.57 cm     TR Peak grad:   31.6 mmHg MV Decel Time: 296 msec     TR Vmax:        281.00 cm/s MV E velocity: 69.50 cm/s MV A velocity: 115.50 cm/s  SHUNTS MV E/A ratio:  0.60         Systemic VTI:  0.27 m Systemic Diam: 2.00 cm  Jennifer Crape MD Electronically signed by Jennifer Crape MD Signature Date/Time: 07/05/2024/1:42:40 PM    Final          ______________________________________________________________________________________________      EKG Interpretation Date/Time:  Monday July 18 2024 16:09:02 EDT Ventricular Rate:  49 PR Interval:  196 QRS Duration:  88 QT Interval:  418 QTC Calculation: 377 R Axis:   88  Text Interpretation: Sinus bradycardia When compared with ECG of 28-Apr-2019 12:03, Premature ventricular complexes are no longer Present Criteria for Anteroseptal infarct are no longer Present Confirmed by Monetta Rogue ([id number]) on 07/18/2024 4:23:39 PM   Recent Labs: 07/20/2023: ALT 15; BUN 16; Creatinine, Ser 0.68; Potassium 4.1; Sodium 137  Recent Lipid Panel    Component Value Date/Time   CHOL 188 07/20/2023 0848   TRIG 160 (H) 07/20/2023 0848   HDL 53 07/20/2023 0848   CHOLHDL 3.5 07/20/2023 0848   LDLCALC 107 (H) 07/20/2023 0848    Physical Exam:    VS:  BP 112/62   Pulse (!) 49   Ht 5' 2 (1.575 m)   Wt 166 lb 12.8 oz (75.7 kg)   SpO2 94%   BMI 30.51 kg/m     Wt Readings from Last 3 Encounters:  07/18/24 166 lb 12.8 oz (75.7 kg)  07/20/23 163 lb 6.4 oz (74.1 kg)   01/01/23 163 lb 6.4 oz (74.1 kg)     GEN:  Well nourished, well developed in no acute distress HEENT: Normal NECK: No JVD; No carotid bruits LYMPHATICS: No lymphadenopathy CARDIAC: 2/6 murmur of AAS does not radiate to the carotids or encompass S2 RRR, no murmurs, rubs, gallops RESPIRATORY:  Clear to auscultation without rales, wheezing or rhonchi  ABDOMEN: Soft, non-tender, non-distended MUSCULOSKELETAL:  No edema; No deformity  SKIN: Warm and dry NEUROLOGIC:  Alert and oriented x 3 PSYCHIATRIC:  Normal affect    Signed, Rogue Monetta, MD  07/18/2024 4:37 PM    [facility]

## 2024-07-18 ENCOUNTER — Encounter: Payer: Self-pay | Admitting: Cardiology

## 2024-07-18 ENCOUNTER — Ambulatory Visit: Attending: Cardiology | Admitting: Cardiology

## 2024-07-18 VITALS — BP 112/62 | HR 49 | Ht 62.0 in | Wt 166.8 lb

## 2024-07-18 DIAGNOSIS — E782 Mixed hyperlipidemia: Secondary | ICD-10-CM | POA: Insufficient documentation

## 2024-07-18 DIAGNOSIS — I351 Nonrheumatic aortic (valve) insufficiency: Secondary | ICD-10-CM | POA: Insufficient documentation

## 2024-07-18 DIAGNOSIS — I350 Nonrheumatic aortic (valve) stenosis: Secondary | ICD-10-CM | POA: Diagnosis not present

## 2024-07-18 DIAGNOSIS — I119 Hypertensive heart disease without heart failure: Secondary | ICD-10-CM | POA: Insufficient documentation

## 2024-07-18 NOTE — Addendum Note (Signed)
 Addended by: SHERRE ADE I on: 07/18/2024 04:57 PM   Modules accepted: Orders

## 2024-07-18 NOTE — Patient Instructions (Signed)
 Medication Instructions:  Your physician recommends that you continue on your current medications as directed. Please refer to the Current Medication list given to you today.  *If you need a refill on your cardiac medications before your next appointment, please call your pharmacy*  Lab Work: None If you have labs (blood work) drawn today and your tests are completely normal, you will receive your results only by: MyChart Message (if you have MyChart) OR A paper copy in the mail If you have any lab test that is abnormal or we need to change your treatment, we will call you to review the results.  Testing/Procedures: Your physician has requested that you have an echocardiogram. Echocardiography is a painless test that uses sound waves to create images of your heart. It provides your doctor with information about the size and shape of your heart and how well your heart's chambers and valves are working. This procedure takes approximately one hour. There are no restrictions for this procedure. Please do NOT wear cologne, perfume, aftershave, or lotions (deodorant is allowed). Please arrive 15 minutes prior to your appointment time.  Please note: We ask at that you not bring children with you during ultrasound (echo/ vascular) testing. Due to room size and safety concerns, children are not allowed in the ultrasound rooms during exams. Our front office staff cannot provide observation of children in our lobby area while testing is being conducted. An adult accompanying a patient to their appointment will only be allowed in the ultrasound room at the discretion of the ultrasound technician under special circumstances. We apologize for any inconvenience.   Follow-Up: At [facility], you and your health needs are our priority.  As part of our continuing mission to provide you with exceptional heart care, our providers are all part of one team.  This team includes your primary Cardiologist  (physician) and Advanced Practice Providers or APPs (Physician Assistants and Nurse Practitioners) who all work together to provide you with the care you need, when you need it.  Your next appointment:   1 year(s)  Provider:   Redell Leiter, MD    We recommend signing up for the patient portal called MyChart.  Sign up information is provided on this After Visit Summary.  MyChart is used to connect with patients for Virtual Visits (Telemedicine).  Patients are able to view lab/test results, encounter notes, upcoming appointments, etc.  Non-urgent messages can be sent to your provider as well.   To learn more about what you can do with MyChart, go to [email or web address].   Other Instructions Check your heart rate and blood pressure and contact the office if heart rate is less than 45 bpm frequently

## 2024-09-07 DIAGNOSIS — Z23 Encounter for immunization: Secondary | ICD-10-CM | POA: Diagnosis not present

## 2024-09-07 DIAGNOSIS — Z6830 Body mass index (BMI) 30.0-30.9, adult: Secondary | ICD-10-CM | POA: Diagnosis not present

## 2024-09-07 DIAGNOSIS — M549 Dorsalgia, unspecified: Secondary | ICD-10-CM | POA: Diagnosis not present

## 2024-10-09 DIAGNOSIS — J029 Acute pharyngitis, unspecified: Secondary | ICD-10-CM | POA: Diagnosis not present

## 2024-10-09 DIAGNOSIS — R0982 Postnasal drip: Secondary | ICD-10-CM | POA: Diagnosis not present

## 2024-10-09 DIAGNOSIS — R051 Acute cough: Secondary | ICD-10-CM | POA: Diagnosis not present

## 2024-10-09 DIAGNOSIS — J301 Allergic rhinitis due to pollen: Secondary | ICD-10-CM | POA: Diagnosis not present

## 2024-10-12 DIAGNOSIS — Z6829 Body mass index (BMI) 29.0-29.9, adult: Secondary | ICD-10-CM | POA: Diagnosis not present

## 2024-10-12 DIAGNOSIS — J069 Acute upper respiratory infection, unspecified: Secondary | ICD-10-CM | POA: Diagnosis not present
